# Patient Record
Sex: Male | Born: 1955
Health system: Southern US, Community
[De-identification: ages and names within clinical notes are randomized; demographics above are authoritative.]

## PROBLEM LIST (undated history)

## (undated) DIAGNOSIS — M75100 Unspecified rotator cuff tear or rupture of unspecified shoulder, not specified as traumatic: Secondary | ICD-10-CM

## (undated) DIAGNOSIS — K219 Gastro-esophageal reflux disease without esophagitis: Secondary | ICD-10-CM

## (undated) DIAGNOSIS — I2510 Atherosclerotic heart disease of native coronary artery without angina pectoris: Secondary | ICD-10-CM

## (undated) DIAGNOSIS — Z8781 Personal history of (healed) traumatic fracture: Secondary | ICD-10-CM

## (undated) DIAGNOSIS — G4733 Obstructive sleep apnea (adult) (pediatric): Secondary | ICD-10-CM

## (undated) DIAGNOSIS — I493 Ventricular premature depolarization: Secondary | ICD-10-CM

## (undated) DIAGNOSIS — R198 Other specified symptoms and signs involving the digestive system and abdomen: Secondary | ICD-10-CM

## (undated) DIAGNOSIS — I5189 Other ill-defined heart diseases: Secondary | ICD-10-CM

## (undated) DIAGNOSIS — I209 Angina pectoris, unspecified: Secondary | ICD-10-CM

## (undated) DIAGNOSIS — I119 Hypertensive heart disease without heart failure: Secondary | ICD-10-CM

## (undated) DIAGNOSIS — F1020 Alcohol dependence, uncomplicated: Secondary | ICD-10-CM

## (undated) DIAGNOSIS — I10 Essential (primary) hypertension: Secondary | ICD-10-CM

## (undated) DIAGNOSIS — E785 Hyperlipidemia, unspecified: Secondary | ICD-10-CM

## (undated) DIAGNOSIS — Z87442 Personal history of urinary calculi: Secondary | ICD-10-CM

## (undated) HISTORY — DX: Obstructive sleep apnea (adult) (pediatric): G47.33

## (undated) HISTORY — DX: Other specified symptoms and signs involving the digestive system and abdomen: R19.8

## (undated) HISTORY — DX: Hyperlipidemia, unspecified: E78.5

## (undated) HISTORY — PX: TONSILLECTOMY: SUR1361

## (undated) HISTORY — DX: Essential (primary) hypertension: I10

## (undated) HISTORY — DX: Ventricular premature depolarization: I49.3

## (undated) HISTORY — DX: Gastro-esophageal reflux disease without esophagitis: K21.9

## (undated) HISTORY — DX: Atherosclerotic heart disease of native coronary artery without angina pectoris: I25.10

## (undated) HISTORY — PX: CARDIAC CATHETERIZATION: SHX172

## (undated) HISTORY — DX: Hypertensive heart disease without heart failure: I11.9

## (undated) HISTORY — PX: BACK SURGERY: SHX140

---

## 2003-02-19 HISTORY — PX: CORONARY ARTERY BYPASS GRAFT: SHX141

## 2003-03-07 ENCOUNTER — Inpatient Hospital Stay (HOSPITAL_COMMUNITY): Admission: AD | Admit: 2003-03-07 | Discharge: 2003-03-13 | Payer: Self-pay | Admitting: Cardiology

## 2003-03-11 ENCOUNTER — Encounter: Payer: Self-pay | Admitting: Thoracic Surgery (Cardiothoracic Vascular Surgery)

## 2003-03-17 ENCOUNTER — Inpatient Hospital Stay (HOSPITAL_COMMUNITY)
Admission: RE | Admit: 2003-03-17 | Discharge: 2003-03-21 | Payer: Self-pay | Admitting: Thoracic Surgery (Cardiothoracic Vascular Surgery)

## 2003-03-17 ENCOUNTER — Encounter: Payer: Self-pay | Admitting: Thoracic Surgery (Cardiothoracic Vascular Surgery)

## 2003-03-18 ENCOUNTER — Encounter: Payer: Self-pay | Admitting: Thoracic Surgery (Cardiothoracic Vascular Surgery)

## 2003-03-19 ENCOUNTER — Encounter: Payer: Self-pay | Admitting: Thoracic Surgery (Cardiothoracic Vascular Surgery)

## 2005-02-15 ENCOUNTER — Observation Stay (HOSPITAL_COMMUNITY): Admission: EM | Admit: 2005-02-15 | Discharge: 2005-02-16 | Payer: Self-pay | Admitting: Emergency Medicine

## 2005-04-19 ENCOUNTER — Ambulatory Visit: Payer: Self-pay | Admitting: Internal Medicine

## 2005-05-08 ENCOUNTER — Ambulatory Visit: Payer: Self-pay | Admitting: Anesthesiology

## 2006-07-18 ENCOUNTER — Inpatient Hospital Stay: Payer: Self-pay | Admitting: Unknown Physician Specialty

## 2006-07-18 ENCOUNTER — Other Ambulatory Visit: Payer: Self-pay

## 2006-08-15 ENCOUNTER — Ambulatory Visit: Payer: Self-pay | Admitting: Anesthesiology

## 2006-11-02 ENCOUNTER — Ambulatory Visit: Payer: Self-pay | Admitting: Unknown Physician Specialty

## 2006-12-23 ENCOUNTER — Other Ambulatory Visit: Payer: Self-pay

## 2006-12-23 ENCOUNTER — Inpatient Hospital Stay: Payer: Self-pay | Admitting: Internal Medicine

## 2006-12-27 ENCOUNTER — Ambulatory Visit: Payer: Self-pay | Admitting: Unknown Physician Specialty

## 2007-01-19 ENCOUNTER — Ambulatory Visit: Payer: Self-pay | Admitting: Unknown Physician Specialty

## 2007-02-19 ENCOUNTER — Ambulatory Visit: Payer: Self-pay | Admitting: Unknown Physician Specialty

## 2007-11-21 HISTORY — PX: OTHER SURGICAL HISTORY: SHX169

## 2010-05-23 ENCOUNTER — Inpatient Hospital Stay: Payer: Self-pay | Admitting: Surgery

## 2011-01-16 ENCOUNTER — Ambulatory Visit: Payer: Self-pay | Admitting: Cardiology

## 2011-02-16 ENCOUNTER — Ambulatory Visit (INDEPENDENT_AMBULATORY_CARE_PROVIDER_SITE_OTHER): Payer: BC Managed Care – PPO | Admitting: Cardiology

## 2011-02-16 ENCOUNTER — Encounter: Payer: Self-pay | Admitting: Cardiology

## 2011-02-16 DIAGNOSIS — I259 Chronic ischemic heart disease, unspecified: Secondary | ICD-10-CM

## 2011-02-16 DIAGNOSIS — I10 Essential (primary) hypertension: Secondary | ICD-10-CM

## 2011-02-16 DIAGNOSIS — K219 Gastro-esophageal reflux disease without esophagitis: Secondary | ICD-10-CM

## 2011-02-16 DIAGNOSIS — R0789 Other chest pain: Secondary | ICD-10-CM

## 2011-02-16 DIAGNOSIS — E7800 Pure hypercholesterolemia, unspecified: Secondary | ICD-10-CM

## 2011-02-16 DIAGNOSIS — Z951 Presence of aortocoronary bypass graft: Secondary | ICD-10-CM

## 2011-02-16 NOTE — Assessment & Plan Note (Signed)
The patient has not been experiencing any recent angina pectoris he does have occasional chest pain which is relieved by taking clonazepam and sometimes by lying down and resting.  His last nuclear stress test was 10/12/08 and showed old anterolateral scar but no reversible ischemia.
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
## 2011-02-16 NOTE — Assessment & Plan Note (Signed)
The patient's symptoms of reflux have been controlled with Nexium 40 mg daily.

## 2011-02-16 NOTE — Assessment & Plan Note (Signed)
The patient had a recent complete lab work dated 02/01/11 which we reviewed.  His HDL was 77 and his LDL was 65 and his triglycerides were 65.  These are excellent results.

## 2011-08-06 ENCOUNTER — Emergency Department: Payer: Self-pay | Admitting: Emergency Medicine

## 2011-09-29 ENCOUNTER — Encounter: Payer: Self-pay | Admitting: Cardiology

## 2011-09-29 ENCOUNTER — Ambulatory Visit (INDEPENDENT_AMBULATORY_CARE_PROVIDER_SITE_OTHER): Payer: BC Managed Care – PPO | Admitting: Cardiology

## 2011-09-29 VITALS — BP 115/78 | HR 85 | Ht 68.0 in | Wt 193.4 lb

## 2011-09-29 DIAGNOSIS — I259 Chronic ischemic heart disease, unspecified: Secondary | ICD-10-CM

## 2011-09-29 DIAGNOSIS — I10 Essential (primary) hypertension: Secondary | ICD-10-CM

## 2011-09-29 DIAGNOSIS — E7800 Pure hypercholesterolemia, unspecified: Secondary | ICD-10-CM

## 2011-09-29 NOTE — Assessment & Plan Note (Signed)
The patient has not had any recent chest pain or angina. 

## 2011-09-29 NOTE — Patient Instructions (Addendum)
continue same dose of medications  Your physician wants you to follow-up in: 6 months You will receive a reminder letter in the mail two months in advance. If you don't receive a letter, please call our office to schedule the follow-up appointment.  

## 2011-09-29 NOTE — Assessment & Plan Note (Signed)
The patient is taking Lipitor and Zetia and is not having any side effects from the medication.

## 2011-09-29 NOTE — Progress Notes (Signed)
Chase Scott Date of Birth:  Jul 09, 1956 [facility] / [facility] [address]   [address] [place], [place]  [address] [phone]           Fax   [phone]  History of Present Illness: This pleasant 55 year old Caucasian male is seen for a scheduled 6 month followup office visit to the history of known ischemic heart disease.  He had coronary artery bypass graft surgery on 03/17/03.  His last stent procedure was done in December 2005 at [facility] and was a stent to the obtuse marginal.  Last stress test was a Cardiolite study in November 2009 showing no reversible ischemia and his ejection fraction was 73% and he had an old anterolateral scar.  Patient has not been experiencing any recent chest pain.  He has had some occasional dizziness and he recently cut back on his beta blocker from 150 mg Lopressor to 1.00 mg Lopressor with slight improvement.  Current Outpatient Prescriptions  Medication Sig Dispense Refill  . atorvastatin (LIPITOR) 80 MG tablet Take 80 mg by mouth daily.        . clopidogrel (PLAVIX) 75 MG tablet Take 75 mg by mouth daily.        Marland Kitchen esomeprazole (NEXIUM) 40 MG capsule Take 40 mg by mouth daily before breakfast.        . ezetimibe (ZETIA) 10 MG tablet Take 10 mg by mouth daily.        Marland Kitchen gabapentin (NEURONTIN) 300 MG capsule Take 300 mg by mouth 2 (two) times daily.        . IBUPROFEN PO Take by mouth.        . methocarbamol (ROBAXIN) 750 MG tablet Take 750 mg by mouth daily.        . metoprolol (LOPRESSOR) 100 MG tablet Take 100 mg by mouth. Taking daily      . ramipril (ALTACE) 10 MG tablet Take 10 mg by mouth daily.          Allergies  Allergen Reactions  . Penicillins   . Sulfa Antibiotics     Patient Active Problem List  Diagnoses  . Ischemic heart disease  . Hypertension  . Hypercholesterolemia  . Hx of CABG  . GERD (gastroesophageal reflux disease)    History  Smoking status  . Former Smoker  Smokeless tobacco  . Not on file     History  Alcohol Use: Not on file    No family history on file.  Review of Systems: Constitutional: no fever chills diaphoresis or fatigue or change in weight.  Head and neck: no hearing loss, no epistaxis, no photophobia or visual disturbance. Respiratory: No cough, shortness of breath or wheezing. Cardiovascular: No chest pain peripheral edema, palpitations. Gastrointestinal: No abdominal distention, no abdominal pain, no change in bowel habits hematochezia or melena. Genitourinary: No dysuria, no frequency, no urgency, no nocturia. Musculoskeletal:No arthralgias, no back pain, no gait disturbance or myalgias. Neurological: No dizziness, no headaches, no numbness, no seizures, no syncope, no weakness, no tremors. Hematologic: No lymphadenopathy, no easy bruising. Psychiatric: No confusion, no hallucinations, no sleep disturbance.    Physical Exam: Filed Vitals:   09/29/11 1549  BP: 115/78  Pulse: 85    The patient appears to be in no distress.  Head and neck exam reveals that the pupils are equal and reactive.  The extraocular movements are full.  There is no scleral icterus.  Mouth and pharynx are benign.  No lymphadenopathy.  No carotid bruits.  The jugular venous pressure is normal.  Thyroid is not enlarged or tender.  Chest is clear to percussion and auscultation.  No rales or rhonchi.  Expansion of the chest is symmetrical.  Heart reveals no abnormal lift or heave.  First and second heart sounds are normal.  There is no murmur gallop rub or click.  The abdomen is soft and nontender.  Bowel sounds are normoactive.  There is no hepatosplenomegaly or mass.  There are no abdominal bruits.  Extremities reveal no phlebitis or edema.  Pedal pulses are good.  There is no cyanosis or clubbing.  Neurologic exam is normal strength and no lateralizing weakness.  No sensory deficits.  Integument reveals no rash  Assessment / Plan: Continue present medication and be  rechecked in 6 months for a followup office visit

## 2011-09-29 NOTE — Assessment & Plan Note (Signed)
He feels that his blood pressure is little low at times.  He feels dizzy when this happens.  If this happens he will increase his salt intake rather than cutting back further on his beta blocker since his heart rate today is 83

## 2012-02-01 ENCOUNTER — Encounter: Payer: Self-pay | Admitting: Cardiology

## 2012-07-01 ENCOUNTER — Encounter (HOSPITAL_COMMUNITY): Payer: Self-pay | Admitting: Emergency Medicine

## 2012-07-01 ENCOUNTER — Emergency Department (HOSPITAL_COMMUNITY): Payer: BC Managed Care – PPO

## 2012-07-01 ENCOUNTER — Inpatient Hospital Stay (HOSPITAL_COMMUNITY)
Admission: EM | Admit: 2012-07-01 | Discharge: 2012-07-02 | DRG: 125 | Disposition: A | Discharge status: Home / Self Care | Payer: BC Managed Care – PPO | Attending: Cardiology | Admitting: Cardiology

## 2012-07-01 ENCOUNTER — Telehealth: Payer: Self-pay | Admitting: Cardiology

## 2012-07-01 DIAGNOSIS — Z951 Presence of aortocoronary bypass graft: Secondary | ICD-10-CM

## 2012-07-01 DIAGNOSIS — I259 Chronic ischemic heart disease, unspecified: Secondary | ICD-10-CM

## 2012-07-01 DIAGNOSIS — I2582 Chronic total occlusion of coronary artery: Secondary | ICD-10-CM | POA: Diagnosis present

## 2012-07-01 DIAGNOSIS — E785 Hyperlipidemia, unspecified: Secondary | ICD-10-CM | POA: Diagnosis present

## 2012-07-01 DIAGNOSIS — I2510 Atherosclerotic heart disease of native coronary artery without angina pectoris: Principal | ICD-10-CM

## 2012-07-01 DIAGNOSIS — Z882 Allergy status to sulfonamides status: Secondary | ICD-10-CM

## 2012-07-01 DIAGNOSIS — Z9861 Coronary angioplasty status: Secondary | ICD-10-CM

## 2012-07-01 DIAGNOSIS — Z87891 Personal history of nicotine dependence: Secondary | ICD-10-CM

## 2012-07-01 DIAGNOSIS — Z880 Allergy status to penicillin: Secondary | ICD-10-CM

## 2012-07-01 DIAGNOSIS — Z79899 Other long term (current) drug therapy: Secondary | ICD-10-CM

## 2012-07-01 DIAGNOSIS — R079 Chest pain, unspecified: Secondary | ICD-10-CM

## 2012-07-01 DIAGNOSIS — E7800 Pure hypercholesterolemia, unspecified: Secondary | ICD-10-CM | POA: Diagnosis present

## 2012-07-01 DIAGNOSIS — I10 Essential (primary) hypertension: Secondary | ICD-10-CM | POA: Diagnosis present

## 2012-07-01 DIAGNOSIS — I119 Hypertensive heart disease without heart failure: Secondary | ICD-10-CM

## 2012-07-01 DIAGNOSIS — K219 Gastro-esophageal reflux disease without esophagitis: Secondary | ICD-10-CM | POA: Diagnosis present

## 2012-07-01 DIAGNOSIS — Z7902 Long term (current) use of antithrombotics/antiplatelets: Secondary | ICD-10-CM

## 2012-07-01 LAB — CARDIAC PANEL(CRET KIN+CKTOT+MB+TROPI)
CK, MB: 3.2 ng/mL (ref 0.3–4.0)
Total CK: 229 U/L (ref 7–232)
Troponin I: <0.3 ng/mL (ref <0.30)

## 2012-07-01 LAB — COMPREHENSIVE METABOLIC PANEL
ALT: 20 U/L (ref 0–53)
BUN: 11 mg/dL (ref 6–23)
Calcium: 9.7 mg/dL (ref 8.4–10.5)
GFR calc Af Amer: >90 mL/min (ref >90)
Glucose, Bld: 141 mg/dL — ABNORMAL HIGH (ref 70–99)
Sodium: 141 mEq/L (ref 135–145)
Total Protein: 7.3 g/dL (ref 6.0–8.3)

## 2012-07-01 LAB — PROTIME-INR
INR: 1.04 (ref 0.00–1.49)
INR: 1.11 (ref 0.00–1.49)
Prothrombin Time: 14.5 seconds (ref 11.6–15.2)

## 2012-07-01 LAB — CBC WITH DIFFERENTIAL/PLATELET
Basophils Relative: 1 % (ref 0–1)
Eosinophils Absolute: 0.2 10*3/uL (ref 0.0–0.7)
Hemoglobin: 15.6 g/dL (ref 13.0–17.0)
MCH: 31.1 pg (ref 26.0–34.0)
MCHC: 35.5 g/dL (ref 30.0–36.0)
Monocytes Relative: 7 % (ref 3–12)
Neutrophils Relative %: 50 % (ref 43–77)

## 2012-07-01 LAB — BASIC METABOLIC PANEL
BUN: 11 mg/dL (ref 6–23)
Chloride: 103 mEq/L (ref 96–112)
GFR calc non Af Amer: 73 mL/min — ABNORMAL LOW (ref >90)
Glucose, Bld: 97 mg/dL (ref 70–99)
Potassium: 4.6 mEq/L (ref 3.5–5.1)
Sodium: 139 mEq/L (ref 135–145)

## 2012-07-01 LAB — POCT I-STAT TROPONIN I: Troponin i, poc: 0 ng/mL (ref 0.00–0.08)

## 2012-07-01 LAB — MAGNESIUM: Magnesium: 2.1 mg/dL (ref 1.5–2.5)

## 2012-07-01 MED ORDER — NITROGLYCERIN 0.4 MG SL SUBL
0.4000 mg | SUBLINGUAL_TABLET | SUBLINGUAL | Status: DC | PRN
  Administered 2012-07-01 (×2): 0.4 mg via SUBLINGUAL

## 2012-07-01 MED ORDER — ACETAMINOPHEN 325 MG PO TABS: 650.0000 mg | ORAL_TABLET | ORAL | Status: DC | PRN

## 2012-07-01 MED ORDER — SODIUM CHLORIDE 0.9 % IJ SOLN: 3.0000 mL | INTRAMUSCULAR | Status: DC | PRN

## 2012-07-01 MED ORDER — HEPARIN BOLUS VIA INFUSION
4000.0000 [IU] | Freq: Once | INTRAVENOUS | Status: AC
  Administered 2012-07-01: 4000 [IU] via INTRAVENOUS

## 2012-07-01 MED ORDER — SODIUM CHLORIDE 0.9 % IJ SOLN: 3.0000 mL | Freq: Two times a day (BID) | INTRAMUSCULAR | Status: DC

## 2012-07-01 MED ORDER — NITROGLYCERIN 0.4 MG SL SUBL: 0.4000 mg | SUBLINGUAL_TABLET | SUBLINGUAL | Status: DC | PRN

## 2012-07-01 MED ORDER — ASPIRIN 81 MG PO CHEW
324.0000 mg | CHEWABLE_TABLET | ORAL | Status: DC
  Filled 2012-07-01: qty 4

## 2012-07-01 MED ORDER — ASPIRIN EC 81 MG PO TBEC: 81.0000 mg | DELAYED_RELEASE_TABLET | Freq: Every day | ORAL | Status: DC

## 2012-07-01 MED ORDER — SODIUM CHLORIDE 0.9 % IV SOLN: 250.0000 mL | INTRAVENOUS | Status: DC | PRN

## 2012-07-01 MED ORDER — METHOCARBAMOL 750 MG PO TABS
375.0000 mg | ORAL_TABLET | Freq: Every day | ORAL | Status: DC | PRN
  Filled 2012-07-01: qty 1

## 2012-07-01 MED ORDER — ONDANSETRON HCL 4 MG/2ML IJ SOLN: 4.0000 mg | Freq: Four times a day (QID) | INTRAMUSCULAR | Status: DC | PRN

## 2012-07-01 MED ORDER — RAMIPRIL 10 MG PO TABS: 10.0000 mg | ORAL_TABLET | Freq: Two times a day (BID) | ORAL | Status: DC

## 2012-07-01 MED ORDER — PANTOPRAZOLE SODIUM 40 MG PO TBEC
80.0000 mg | DELAYED_RELEASE_TABLET | Freq: Two times a day (BID) | ORAL | Status: DC
  Administered 2012-07-01 – 2012-07-02 (×2): 80 mg via ORAL
  Filled 2012-07-01: qty 1

## 2012-07-01 MED ORDER — METOPROLOL TARTRATE 50 MG PO TABS
50.0000 mg | ORAL_TABLET | Freq: Three times a day (TID) | ORAL | Status: DC
  Administered 2012-07-01: 50 mg via ORAL
  Filled 2012-07-01 (×4): qty 1

## 2012-07-01 MED ORDER — ATORVASTATIN CALCIUM 80 MG PO TABS
80.0000 mg | ORAL_TABLET | Freq: Every day | ORAL | Status: DC
  Administered 2012-07-01 – 2012-07-02 (×2): 80 mg via ORAL
  Filled 2012-07-01 (×2): qty 1

## 2012-07-01 MED ORDER — GABAPENTIN 300 MG PO CAPS
300.0000 mg | ORAL_CAPSULE | Freq: Two times a day (BID) | ORAL | Status: DC | PRN
  Filled 2012-07-01: qty 1

## 2012-07-01 MED ORDER — RAMIPRIL 10 MG PO CAPS
10.0000 mg | ORAL_CAPSULE | Freq: Two times a day (BID) | ORAL | Status: DC
  Administered 2012-07-01: 10 mg via ORAL
  Filled 2012-07-01 (×4): qty 1

## 2012-07-01 MED ORDER — ZOLPIDEM TARTRATE 5 MG PO TABS: 5.0000 mg | ORAL_TABLET | Freq: Every evening | ORAL | Status: DC | PRN

## 2012-07-01 MED ORDER — EZETIMIBE 10 MG PO TABS
10.0000 mg | ORAL_TABLET | Freq: Every day | ORAL | Status: DC
  Administered 2012-07-02: 10 mg via ORAL
  Filled 2012-07-01: qty 1

## 2012-07-01 MED ORDER — ASPIRIN 81 MG PO CHEW
324.0000 mg | CHEWABLE_TABLET | ORAL | Status: AC
  Administered 2012-07-02: 324 mg via ORAL

## 2012-07-01 MED ORDER — HEPARIN (PORCINE) IN NACL 100-0.45 UNIT/ML-% IJ SOLN
1300.0000 [IU]/h | INTRAMUSCULAR | Status: DC
  Administered 2012-07-01: 1200 [IU]/h via INTRAVENOUS
  Filled 2012-07-01 (×2): qty 250

## 2012-07-01 MED ORDER — ASPIRIN 300 MG RE SUPP
300.0000 mg | RECTAL | Status: DC
  Filled 2012-07-01: qty 1

## 2012-07-01 MED ORDER — ASPIRIN 81 MG PO CHEW
324.0000 mg | CHEWABLE_TABLET | Freq: Once | ORAL | Status: AC
  Administered 2012-07-01: 324 mg via ORAL
  Filled 2012-07-01: qty 4

## 2012-07-01 MED ORDER — ALPRAZOLAM 0.25 MG PO TABS: 0.2500 mg | ORAL_TABLET | Freq: Two times a day (BID) | ORAL | Status: DC | PRN

## 2012-07-01 NOTE — Telephone Encounter (Signed)
Please return call to patient at work [phone]  Pt c/o chest pains, SOB, light headed, Tingling in fingers

## 2012-07-01 NOTE — Telephone Encounter (Signed)
Patient has been having chest pain for sometime , but for the last two days the chest pain and pressure has been unbearable. Pain gets worse with mild activity score " 7 or 8 ", it gest better when he sits down, but the pressure is still there also he gets light headed with activity. Patient is at work took a NTG which it help the pain.

## 2012-07-01 NOTE — Progress Notes (Signed)
ANTICOAGULATION CONSULT NOTE - Initial Consult  Pharmacy Consult for  Heparin Indication: chest pain/ACS  Allergies  Allergen Reactions  . Penicillins Anaphylaxis  . Sulfa Antibiotics Anaphylaxis    Patient Measurements: Height: 5\' 8"  (172.7 cm) Weight: 200 lb (90.719 kg) (estimated by patient) IBW/kg (Calculated) : 68.4  Heparin Dosing Weight: 86 kg  Vital Signs: Temp: 98.1 F (36.7 C) (08/12 1336) Temp src: Oral (08/12 1336) BP: 119/75 mmHg (08/12 1630) Pulse Rate: 65  (08/12 1630)  Labs:  Basename 07/01/12 1410  HGB 15.2  HCT 41.8  PLT 198  APTT --  LABPROT 13.8  INR 1.04  HEPARINUNFRC --  CREATININE 1.10  CKTOTAL --  CKMB --  TROPONINI --    Estimated Creatinine Clearance: 82 ml/min (by C-G formula based on Cr of 1.1).   Medical History: Past Medical History  Diagnosis Date  . CAD (coronary artery disease)   . Fracture     Left shoulder  . Chest pain     Rule out myocardial infarction. Rule out acute coronary syndrome.  . Labile hypertension   . Hyperlipidemia   . Gastrointestinal symptoms     Chronic gastrointestinal symptoms  . Gastroesophageal reflux disease   . Dyslipidemia   . Hypertensive cardiovascular disease    Assessment:  56 yr old man being admitted with chest pain.  Hx CAD, CABG in 2004, stent in 2005.  Taking Plavix at home but not taking Aspirin -> Aspirin 324 mg given in ED, and EC Aspirin 81 mg daily added.  Planning cardiac cath 07/02/12.  Goal of Therapy:  Heparin level 0.3-0.7 units/ml Monitor platelets by anticoagulation protocol: Yes   Plan:    Heparin 4000 units IV x1 followed by 1200 units/hr infusion.   Heparin level ~6 hrs after drip begins.   Daily heparin level and CBC while on Heparin.  Dennie Fetters, [place] Pager: [phone] 07/01/2012,6:46 PM

## 2012-07-01 NOTE — H&P (Signed)
Chase Scott is an 56 y.o. male.   Chief Complaint: Chest pain HPI: This 56 year old man came to the ER today because of worsening chest pain. He has a history of known CAD. He had CABG x4 on 03/17/03 by Dr. Cornelius Moras.  In 2005 he had cath at [facility] with stent to obtuse marginal.. Last stress test was Cardiolite in November 2009 showed no reversible ischemia and his EF was 73% and there was old anterolateral scar. Last seen in office 09/29/11.   Now for past several weeks he has had worsening chest pressure at rest and with activity notes a burning substernal discomfort. Today he also noted numbness of his hands and became concerned.  Trial of SL NTG at home has not resulted in convincing change.  Patient has also had lightheaded feelings and has been dyspneic.  Past Medical History  Diagnosis Date  . CAD (coronary artery disease)   . Fracture     Left shoulder  . Chest pain     Rule out myocardial infarction. Rule out acute coronary syndrome.  . Labile hypertension   . Hyperlipidemia   . Gastrointestinal symptoms     Chronic gastrointestinal symptoms  . Gastroesophageal reflux disease   . Dyslipidemia   . Hypertensive cardiovascular disease     Past Surgical History  Procedure Date  . Coronary artery bypass graft April 2004    x four  . Back surgery     Lower    History reviewed. No pertinent family history. Social History:  reports that he has quit smoking. He does not have any smokeless tobacco history on file. His alcohol and drug histories not on file.  Allergies:  Allergies  Allergen Reactions  . Penicillins Anaphylaxis  . Sulfa Antibiotics Anaphylaxis       Results for orders placed during the hospital encounter of 07/01/12 (from the past 48 hour(s))  CBC     Status: Abnormal   Collection Time   07/01/12  2:10 PM      Component Value Range Comment   WBC 6.7  4.0 - 10.5 K/uL    RBC 4.84  4.22 - 5.81 MIL/uL    Hemoglobin 15.2  13.0 - 17.0 g/dL    HCT 40.9   81.1 - 91.4 %    MCV 86.4  78.0 - 100.0 fL    MCH 31.4  26.0 - 34.0 pg    MCHC 36.4 (*) 30.0 - 36.0 g/dL    RDW 78.2  95.6 - 21.3 %    Platelets 198  150 - 400 K/uL   BASIC METABOLIC PANEL     Status: Abnormal   Collection Time   07/01/12  2:10 PM      Component Value Range Comment   Sodium 139  135 - 145 mEq/L    Potassium 4.6  3.5 - 5.1 mEq/L    Chloride 103  96 - 112 mEq/L    CO2 28  19 - 32 mEq/L    Glucose, Bld 97  70 - 99 mg/dL    BUN 11  6 - 23 mg/dL    Creatinine, Ser 0.86  0.50 - 1.35 mg/dL    Calcium 9.7  8.4 - 57.8 mg/dL    GFR calc non Af Amer 73 (*) >90 mL/min    GFR calc Af Amer 85 (*) >90 mL/min   PROTIME-INR     Status: Normal   Collection Time   07/01/12  2:10 PM  Component Value Range Comment   Prothrombin Time 13.8  11.6 - 15.2 seconds    INR 1.04  0.00 - 1.49   POCT I-STAT TROPONIN I     Status: Normal   Collection Time   07/01/12  2:30 PM      Component Value Range Comment   Troponin i, poc 0.00  0.00 - 0.08 ng/mL    Comment 3             Dg Chest 2 View  07/01/2012  *RADIOLOGY REPORT*  Clinical Data: Chest pain  CHEST - 2 VIEW  Comparison: 02/15/2005  Findings: Normal heart size.  Clear lungs.  Pneumothorax.  No pleural effusion.  Vascular calcifications in the right side of the neck.  IMPRESSION: No active cardiopulmonary disease.  Original Report Authenticated By: Donavan Burnet, M.D.    ROS: Chronic dyspepsia.  Patient also has history of anxiety.  Recently his PCP stopped clonazepam and switched him to gabapentin and Robaxin.  Blood pressure 119/75, pulse 65, temperature 98.1 F (36.7 C), temperature source Oral, resp. rate 11, SpO2 98.00%. The patient appears to be in moderate distress.   Head and neck exam reveals that the pupils are equal and reactive.  The extraocular movements are full.  There is no scleral icterus.  Mouth and pharynx are benign.  No lymphadenopathy.  No carotid bruits.  The jugular venous pressure is normal.  Thyroid is not  enlarged or tender.  Chest is clear to percussion and auscultation.  No rales or rhonchi.  Expansion of the chest is symmetrical.  Heart reveals no abnormal lift or heave.  First and second heart sounds are normal.  There is no murmur gallop rub or click.  The abdomen is soft and nontender.  Bowel sounds are normoactive.  There is no hepatosplenomegaly or mass.  There are no abdominal bruits.  Extremities reveal no phlebitis or edema.  Pedal pulses are good.  There is no cyanosis or clubbing.  Neurologic exam is normal strength and no lateralizing weakness.  No sensory deficits.  Integument reveals no rash  EKG shows no ischemic changes. Initial troponin is normal.  Renal function normal  Assessment/Plan 1.  Worsening chest pain, possible ACS.  EKG normal so far. Troponin normal. 2.  History of CABG x4 in 2004 by Dr. Cornelius Moras, and history of stent to obtuse marginal 2005 at [facility]. 3.  Hx of dyslipidemia 4.  Hx of GERD 5.  Hx of hypertensive CV disease.  Plan:  Admit to monitored bed. Serial enzymes. Cath in am.  Cassell Clement 07/01/2012, 5:18 PM

## 2012-07-01 NOTE — ED Provider Notes (Signed)
History     CSN: [id number]  Arrival date & time 07/01/12  1329   First MD Initiated Contact with Patient 07/01/12 1501      Chief Complaint  Patient presents with  . Chest Pain    (Consider location/radiation/quality/duration/timing/severity/associated sxs/prior treatment) Patient is a 56 y.o. male presenting with chest pain. The history is provided by the patient.  Chest Pain The chest pain began 2 days ago. Chest pain occurs frequently. The chest pain is unchanged. The pain is associated with exertion. At its most intense, the pain is at 8/10. The pain is currently at 3/10. The quality of the pain is described as pressure-like. The pain does not radiate. Chest pain is worsened by exertion. Primary symptoms include nausea. Pertinent negatives for primary symptoms include no syncope, no shortness of breath, no cough, no palpitations, no vomiting and no dizziness.  Pertinent negatives for associated symptoms include no diaphoresis, no lower extremity edema, no near-syncope, no orthopnea, no paroxysmal nocturnal dyspnea and no weakness. He tried nitroglycerin (SL Nitro x1) for the symptoms.  His past medical history is significant for CAD, hypertension and MI.     Past Medical History  Diagnosis Date  . CAD (coronary artery disease)   . Fracture     Left shoulder  . Chest pain     Rule out myocardial infarction. Rule out acute coronary syndrome.  . Labile hypertension   . Hyperlipidemia   . Gastrointestinal symptoms     Chronic gastrointestinal symptoms  . Gastroesophageal reflux disease   . Dyslipidemia   . Hypertensive cardiovascular disease     Past Surgical History  Procedure Date  . Coronary artery bypass graft April 2004    x four  . Back surgery     Lower    History reviewed. No pertinent family history.  History  Substance Use Topics  . Smoking status: Former Games developer  . Smokeless tobacco: Not on file  . Alcohol Use: Not on file      Review of Systems    Constitutional: Negative for diaphoresis.  HENT: Negative.   Respiratory: Negative for cough and shortness of breath.   Cardiovascular: Positive for chest pain. Negative for palpitations, orthopnea, leg swelling, syncope and near-syncope.  Gastrointestinal: Positive for nausea. Negative for vomiting, diarrhea and constipation.  Genitourinary: Negative.   Musculoskeletal: Negative.   Skin: Negative.   Neurological: Negative.  Negative for dizziness, syncope, weakness and light-headedness.  All other systems reviewed and are negative.    Allergies  Penicillins and Sulfa antibiotics  Home Medications   No current outpatient prescriptions on file.  BP 144/93  Pulse 64  Temp 97.5 F (36.4 C) (Oral)  Resp 20  Ht 5\' 8"  (1.727 m)  Wt 207 lb 10.8 oz (94.2 kg)  BMI 31.58 kg/m2  SpO2 97%  Physical Exam  Nursing note and vitals reviewed. Constitutional: He is oriented to person, place, and time. He appears well-developed and well-nourished. No distress.  HENT:  Head: Normocephalic and atraumatic.  Eyes: Conjunctivae are normal.  Neck: Neck supple.  Cardiovascular: Normal rate, regular rhythm, normal heart sounds and intact distal pulses.   No murmur heard. Pulmonary/Chest: Effort normal and breath sounds normal. He has no wheezes. He has no rales.  Abdominal: Soft. He exhibits no distension. There is no tenderness.  Musculoskeletal: Normal range of motion.  Neurological: He is alert and oriented to person, place, and time.  Skin: Skin is warm and dry.    ED Course  Procedures (including  critical care time)  Labs Reviewed  CBC - Abnormal; Notable for the following:    MCHC 36.4 (*)     All other components within normal limits  BASIC METABOLIC PANEL - Abnormal; Notable for the following:    GFR calc non Af Amer 73 (*)     GFR calc Af Amer 85 (*)     All other components within normal limits  APTT - Abnormal; Notable for the following:    aPTT 174 (*)     All other  components within normal limits  COMPREHENSIVE METABOLIC PANEL - Abnormal; Notable for the following:    Glucose, Bld 141 (*)     GFR calc non Af Amer 79 (*)     All other components within normal limits  PROTIME-INR  POCT I-STAT TROPONIN I  CARDIAC PANEL(CRET KIN+CKTOT+MB+TROPI)  PROTIME-INR  CBC WITH DIFFERENTIAL  MAGNESIUM  CBC  HEPARIN LEVEL (UNFRACTIONATED)  CARDIAC PANEL(CRET KIN+CKTOT+MB+TROPI)  CARDIAC PANEL(CRET KIN+CKTOT+MB+TROPI)  TSH  LIPID PANEL   Dg Chest 2 View  07/01/2012  *RADIOLOGY REPORT*  Clinical Data: Chest pain  CHEST - 2 VIEW  Comparison: 02/15/2005  Findings: Normal heart size.  Clear lungs.  Pneumothorax.  No pleural effusion.  Vascular calcifications in the right side of the neck.  IMPRESSION: No active cardiopulmonary disease.  Original Report Authenticated By: Donavan Burnet, M.D.     1. Ischemic heart disease       MDM  56 yo male with PMHx of CAD s/p CABG and stenting, HTN who presents for 2-3 days of worsening, exertional chest pain.  Pain improves with rest and nitro.  Pt has had 2-3 episodes per day for the past 2 days.  AF, VSS, NAD at presentation.  Pain currently 3/10.  No LE edema, orthopnea, or PND.  Will give ASA and Nitro.  EKG: Rate 82, NSR, nml axis, nml intervals, no ST changes, similar to previous EKG from 09/29/11.  Initial troponin negative.  Presentation concerning for unstable angina.  Will admit to cardiology for further evaluation of unstable angina.        Cherre Robins, MD 07/01/12 [phone]

## 2012-07-01 NOTE — ED Notes (Signed)
Pt given crackers and gingerale.

## 2012-07-01 NOTE — ED Notes (Signed)
Pt c/o left CP started today; pt sts SOB

## 2012-07-01 NOTE — ED Notes (Signed)
Heart healthy dinner tray ordered 

## 2012-07-01 NOTE — ED Notes (Signed)
MD at bedside. Cardiology consult. 

## 2012-07-01 NOTE — Telephone Encounter (Signed)
Spoke with patient and he stated he was having a lot of chest pressure but not as much pain while resting but if he gets and moves around pain will come back.  Did discuss with  Dr. Patty Sermons and he thinks patient needs to go to emergency department.  Advised patient and he does not want to call 911 since he would go to [facility] and would prefer to go to [facility], stated his wife will drive him.

## 2012-07-02 ENCOUNTER — Encounter (HOSPITAL_COMMUNITY)
Admission: EM | Disposition: A | Discharge status: Home / Self Care | Payer: Self-pay | Source: Home / Self Care | Attending: Cardiology

## 2012-07-02 DIAGNOSIS — I2510 Atherosclerotic heart disease of native coronary artery without angina pectoris: Secondary | ICD-10-CM

## 2012-07-02 DIAGNOSIS — R079 Chest pain, unspecified: Secondary | ICD-10-CM

## 2012-07-02 DIAGNOSIS — I119 Hypertensive heart disease without heart failure: Secondary | ICD-10-CM

## 2012-07-02 HISTORY — PX: LEFT HEART CATHETERIZATION WITH CORONARY/GRAFT ANGIOGRAM: SHX5450

## 2012-07-02 LAB — CARDIAC PANEL(CRET KIN+CKTOT+MB+TROPI)
CK, MB: 3 ng/mL (ref 0.3–4.0)
Relative Index: 1 (ref 0.0–2.5)
Troponin I: <0.3 ng/mL (ref <0.30)

## 2012-07-02 LAB — CBC
MCV: 87.9 fL (ref 78.0–100.0)
Platelets: 181 10*3/uL (ref 150–400)
RBC: 4.54 MIL/uL (ref 4.22–5.81)
RDW: 12.7 % (ref 11.5–15.5)
WBC: 8.1 10*3/uL (ref 4.0–10.5)

## 2012-07-02 LAB — LIPID PANEL
HDL: 49 mg/dL (ref >39)
Triglycerides: 217 mg/dL — ABNORMAL HIGH (ref <150)

## 2012-07-02 LAB — POCT ACTIVATED CLOTTING TIME: Activated Clotting Time: 110 seconds

## 2012-07-02 SURGERY — LEFT HEART CATHETERIZATION WITH CORONARY/GRAFT ANGIOGRAM

## 2012-07-02 MED ORDER — MIDAZOLAM HCL 2 MG/2ML IJ SOLN
INTRAMUSCULAR | Status: AC
  Filled 2012-07-02: qty 2

## 2012-07-02 MED ORDER — ASPIRIN 81 MG PO TBEC: 81.0000 mg | DELAYED_RELEASE_TABLET | Freq: Every day | ORAL | Status: AC

## 2012-07-02 MED ORDER — FENTANYL CITRATE 0.05 MG/ML IJ SOLN
INTRAMUSCULAR | Status: AC
  Filled 2012-07-02: qty 2

## 2012-07-02 MED ORDER — SODIUM CHLORIDE 0.9 % IV SOLN
INTRAVENOUS | Status: DC
  Administered 2012-07-02: 11:00:00 via INTRAVENOUS

## 2012-07-02 MED ORDER — HEPARIN (PORCINE) IN NACL 2-0.9 UNIT/ML-% IJ SOLN
INTRAMUSCULAR | Status: AC
  Filled 2012-07-02: qty 2000

## 2012-07-02 MED ORDER — LIDOCAINE HCL (PF) 1 % IJ SOLN
INTRAMUSCULAR | Status: AC
  Filled 2012-07-02: qty 30

## 2012-07-02 MED ORDER — NITROGLYCERIN 0.2 MG/ML ON CALL CATH LAB
INTRAVENOUS | Status: AC
  Filled 2012-07-02: qty 1

## 2012-07-02 MED ORDER — PANTOPRAZOLE SODIUM 40 MG PO TBEC: 80.0000 mg | DELAYED_RELEASE_TABLET | Freq: Two times a day (BID) | ORAL | Status: DC

## 2012-07-02 NOTE — Discharge Summary (Signed)
See cath note this am. cdm

## 2012-07-02 NOTE — CV Procedure (Signed)
   Cardiac Catheterization Operative Report  Chase Scott [id number] 8/13/20138:25 AM DEFAULT,PROVIDER, MD  Procedure Performed:  1. Left Heart Catheterization 2. Selective Coronary Angiography 3. Left ventricular angiogram 4. SVG angiography 5. LIMA angiography  Operator: Verne Carrow, MD  Indication:  Chest pain, known CAD with 4VCABG in 2004, previous stent Circumflex and RCA. Cardiac enzymes negative.                                  Procedure Details: The risks, benefits, complications, treatment options, and expected outcomes were discussed with the patient. The patient and/or family concurred with the proposed plan, giving informed consent. The patient was brought to the cath lab after IV hydration was begun and oral premedication was given. The patient was further sedated with Versed and Fentanyl. The right groin was prepped and draped in the usual manner. Using the modified Seldinger access technique, a 5 French sheath was placed in the right femoral artery. Standard diagnostic catheters were used to perform selective coronary angiography. A IMA catheter was used to non-selectively image the RIMA and selectively engage the LIMA graft. The JR 4 was used to engage both vein grafts and the native RCA. A 4 [place] No-Torque catheter was used to engage the RCA at the end of the case. A pigtail catheter was used to perform a left ventricular angiogram.  There were no immediate complications. The patient was taken to the recovery area in stable condition.   Hemodynamic Findings: Central aortic pressure: 116/70 Left ventricular pressure: 118/2/12  Angiographic Findings:  Left main: No obstructive disease noted.   Left Anterior Descending Artery: Large caliber vessel that courses to the apex. 100% mid occlusion. The mid and distal LAD fills from the patent LIMA graft. The Diagonal fills from the patent SVG.   Circumflex Artery: Large caliber vessel with small to moderate  sized first OM with mild plaque disease. Patent stent in second OM with no restenosis. The AV groove Circumflex has mild plaque disease.   Right Coronary Artery: Moderate sized vessel with patent mid stent with minimal 10% restenosis.   Graft Anatomy:   SVG to Circumflex occluded  SVG to Diagonal patent  RIMA to RCA occluded  LIMA to mid LAD patent. The proximal body of the LIMA is tortuous but has no flow limiting lesions.   Left Ventricular Angiogram: LVEF=50-55%.   Impression: 1. Triple vessel CAD with patent stents in RCA and Circumflex and patent grafts to the LAD and Diagonal.  2. Preserved LV systolic function  Recommendations: Continue medical management. D/C home later today after bedrest.        Complications:  None. The patient tolerated the procedure well.

## 2012-07-02 NOTE — Discharge Summary (Signed)
Discharge Summary   Patient ID: Chase Scott,  MRN: [id number], DOB/AGE: [birth year] 56 y.o.  Admit date: 07/01/2012 Discharge date: 07/02/2012  Primary Physician: Sheila Oats, MD Primary Cardiologist: Cassell Clement, MD  Discharge Diagnoses Principal Problem:  *Chest pain Active Problems:  Hypertension  Hypercholesterolemia  GERD (gastroesophageal reflux disease)  CAD (coronary artery disease)  Hypertensive cardiovascular disease   Allergies Allergies  Allergen Reactions  . Penicillins Anaphylaxis  . Sulfa Antibiotics Anaphylaxis    Diagnostic Studies/Procedures  PA/LATERAL CHEST X-RAY - 07/01/12   Comparison: 02/15/2005  Findings: Normal heart size. Clear lungs. Pneumothorax. No  pleural effusion. Vascular calcifications in the right side of the  neck.  IMPRESSION:  No active cardiopulmonary disease.  CARDIAC CATHETERIZATION - 07/02/12  Hemodynamic Findings:  Central aortic pressure: 116/70  Left ventricular pressure: 118/2/12  Angiographic Findings:  Left main: No obstructive disease noted.  Left Anterior Descending Artery: Large caliber vessel that courses to the apex. 100% mid occlusion. The mid and distal LAD fills from the patent LIMA graft. The Diagonal fills from the patent SVG.  Circumflex Artery: Large caliber vessel with small to moderate sized first OM with mild plaque disease. Patent stent in second OM with no restenosis. The AV groove Circumflex has mild plaque disease.  Right Coronary Artery: Moderate sized vessel with patent mid stent with minimal 10% restenosis.  Graft Anatomy:  SVG to Circumflex occluded  SVG to Diagonal patent  RIMA to RCA occluded  LIMA to mid LAD patent. The proximal body of the LIMA is tortuous but has no flow limiting lesions.  Left Ventricular Angiogram: LVEF=50-55%.  Impression:  1. Triple vessel CAD with patent stents in RCA and Circumflex and patent grafts to the LAD and Diagonal.  2. Preserved LV systolic  function   History of Present Illness/Hospital Course  Chase Scott 56yo male with PMHx significant for the above problem list who was admitted to [facility] on 07/01/12 for chest pain concerning for unstable angina.   The patient reportedly endorsed a several week history of worsening chest pressure and burning with activity and at rest with associated bilateral numbness of his hands, shortness of breath and dyspnea. The pain worsened, thus prompting his presentation to [facility] ED. There, EKG revealed no evidence of ischemia. Initial trop-I returned WNL. CXR as above revealed no active cardiopulmonary disease. CBC and BMET were unremarkable. Given the patient's history of CAD with prior PCI and CABG, cardiac risk factors and history concerning for unstable angina, he was admitted with plans for diagnostic cardiac catheterization the following morning.   He was heparinized and continued on his outpatient medical regimen which included ASA/Plavix/ACEi/BB/statin. He remained stable overnight without significant chest pain. Two subsequent sets of cardiac biomarkers returned WNL. Lipid panel returned revealing good LDL control (LDL 62, HDL, 49, TG 217, TC 154). TSH returned WNL. He remained NPO the following morning, and was informed, consented and prepped for cardiac catheterization which was accessed via the R groin. The full details are described above. This was notable for 3v CAD with patent RCA/LCx stents and patent grafts to LAD and diagonal. LVEF quantified at 50-55%. He tolerated the procedure well without complications. The recommendation was made to continue medical management. He was assessed by Dr. Clifton James and found to be stable for discharge. He will continue his prior outpatient medications as listed below. Nexium will be replaced with Protonix to minimize interaction with Plavix. He will follow-up with Dr. Patty Sermons in approximately 2 weeks as outlined below. He  ambulated well, without incident,  and groin site was assessed and deemed to be without evidence of complications. This information, including activity restrictions, has been clearly outlined in the discharge AVS.   Discharge Vitals:  Blood pressure 105/66, pulse 65, temperature 98.3 F (36.8 C), temperature source Oral, resp. rate 18, height 5\' 8"  (1.727 m), weight 94.2 kg (207 lb 10.8 oz), SpO2 100.00%.   Labs: Recent Labs  [facility] 07/02/12 0203 07/01/12 2132   WBC 8.1 7.9   HGB 14.1 15.6   HCT 39.9 43.9   MCV 87.9 87.6   PLT 181 203    Lab 07/01/12 2132 07/01/12 1410  NA 141 139  K 3.7 4.6  CL 102 103  CO2 31 28  BUN 11 11  CREATININE 1.03 1.10  CALCIUM 9.7 9.7  PROT 7.3 --  BILITOT 0.7 --  ALKPHOS 66 --  ALT 20 --  AST 21 --  AMYLASE -- --  LIPASE -- --  GLUCOSE 141* 97   Recent Labs  Basename 07/02/12 1000 07/02/12 0203 07/01/12 2103   CKTOTAL 310* 265* 229   CKMB 3.0 3.3 3.2   CKMBINDEX -- -- --   TROPONINI <0.30 <0.30 <0.30   Recent Labs  Basename 07/02/12 0333   CHOL 154   HDL 49   LDLCALC 62   TRIG 217*   CHOLHDL 3.1   LDLDIRECT --    Basename 07/01/12 2132  TSH 3.129  T4TOTAL --  T3FREE --  THYROIDAB --   Disposition:  Discharge Orders    Future Appointments: Provider: Department: Dept Phone: Center:   07/17/2012 8:45 AM Cassell Clement, MD [facility] [phone] None     Follow-up Information    Follow up with Cassell Clement, MD on 07/17/2012. (At 8:45 AM for follow-up after this hospitalization. )    Contact information:   [address], [address] [place] [address] [phone]         Discharge Medications:  Medication List  As of 07/02/2012  1:02 PM   START taking these medications         aspirin 81 MG EC tablet   Take 1 tablet (81 mg total) by mouth daily.      pantoprazole 40 MG tablet   Commonly known as: PROTONIX   Take 2 tablets (80 mg total) by mouth 2 (two) times daily.         CONTINUE taking these medications           atorvastatin 80 MG tablet   Commonly known as: LIPITOR      clopidogrel 75 MG tablet   Commonly known as: PLAVIX      ezetimibe 10 MG tablet   Commonly known as: ZETIA      gabapentin 300 MG capsule   Commonly known as: NEURONTIN      methocarbamol 750 MG tablet   Commonly known as: ROBAXIN      metoprolol 100 MG tablet   Commonly known as: LOPRESSOR      nitroGLYCERIN 0.4 MG SL tablet   Commonly known as: NITROSTAT      ramipril 10 MG tablet   Commonly known as: ALTACE         STOP taking these medications         esomeprazole 40 MG capsule          Where to get your medications    These are the prescriptions that you need to pick up. We sent them to  a specific pharmacy, so you will need to go there to get them.   [facility] [address] [place], [place] - [address]    [address] [place] [address]    Phone: [phone]        pantoprazole 40 MG tablet         Information on where to get these meds is not yet available. Ask your nurse or doctor.         aspirin 81 MG EC tablet           Outstanding Labs/Studies: None  Duration of Discharge Encounter: Greater than 30 minutes including physician time.  Signed, R. Hurman Horn, PA-C 07/02/2012, 1:02 PM

## 2012-07-02 NOTE — Progress Notes (Signed)
Pt ambulated in hallway 500 ft independently and tolerated activity well. Will Continue to monitor. 

## 2012-07-02 NOTE — Progress Notes (Signed)
ANTICOAGULATION CONSULT NOTE - Follow Up  Pharmacy Consult for  Heparin Indication: chest pain/ACS  Allergies  Allergen Reactions  . Penicillins Anaphylaxis  . Sulfa Antibiotics Anaphylaxis    Patient Measurements: Height: 5\' 8"  (172.7 cm) Weight: 207 lb 10.8 oz (94.2 kg) IBW/kg (Calculated) : 68.4  Heparin Dosing Weight: 86 kg  Vital Signs: Temp: 97.5 F (36.4 C) (08/12 2029) Temp src: Oral (08/12 2029) BP: 144/93 mmHg (08/12 2029) Pulse Rate: 64  (08/12 2029)  Labs:  Basename 07/02/12 0203 07/01/12 2132 07/01/12 2103 07/01/12 1410  HGB 14.1 15.6 -- --  HCT 39.9 43.9 -- 41.8  PLT 181 203 -- 198  APTT -- 174* -- --  LABPROT -- 14.5 -- 13.8  INR -- 1.11 -- 1.04  HEPARINUNFRC 0.31 -- -- --  CREATININE -- 1.03 -- 1.10  CKTOTAL -- -- 229 --  CKMB -- -- 3.2 --  TROPONINI -- -- <0.30 --    Estimated Creatinine Clearance: 89.1 ml/min (by C-G formula based on Cr of 1.03).   Medical History: Past Medical History  Diagnosis Date  . CAD (coronary artery disease)   . Fracture     Left shoulder  . Chest pain     Rule out myocardial infarction. Rule out acute coronary syndrome.  . Labile hypertension   . Hyperlipidemia   . Gastrointestinal symptoms     Chronic gastrointestinal symptoms  . Gastroesophageal reflux disease   . Dyslipidemia   . Hypertensive cardiovascular disease    Assessment:  56 yr old man being admitted with chest pain.  Hx CAD, CABG in 2004, stent in 2005.  Taking Plavix at home but not taking Aspirin -> Aspirin 324 mg given in ED, and EC Aspirin 81 mg daily added.  Planning cardiac cath 07/02/12. Heparin level is at low-end of goal range.   Goal of Therapy:  Heparin level 0.3-0.7 units/ml Monitor platelets by anticoagulation protocol: Yes   Plan:  1. Increase IV heparin to 1300 units/hr to keep within goal range. 2. Heparin level in 6 hours vs. follow-up post-cath.  Emeline Gins 07/02/2012,2:38 AM

## 2012-07-02 NOTE — Interval H&P Note (Signed)
History and Physical Interval Note:  07/02/2012 7:34 AM  Chase Scott  has presented today for surgery, with the diagnosis of Chest pain  The various methods of treatment have been discussed with the patient and family. After consideration of risks, benefits and other options for treatment, the patient has consented to  Procedure(s) (LRB): LEFT HEART CATHETERIZATION WITH CORONARY ANGIOGRAM (N/A) as a surgical intervention .  The patient's history has been reviewed, patient examined, no change in status, stable for surgery.  I have reviewed the patient's chart and labs.  Questions were answered to the patient's satisfaction.     MCALHANY,CHRISTOPHER

## 2012-07-02 NOTE — ED Provider Notes (Signed)
I saw and evaluated the patient, reviewed Dr. Daleen Bo note and I agree with the findings and plan.  The patient presents complaining of pressure in the chest for the past several days.  He has a history of cabg in 2004 with stents several years ago.  He says these symptoms are consistent with his prior cardiac pain.  He denies fever, cough, or shortness of breath.  On exam, the vitals are stable and the patient is afebrile.  The heart and lung exam are unremarkable.  The abdomen is non-tender.  There is no ble edema.  Today's workup revealed and unchanged ekg and the troponin is negative.  Due to the patient's pmh and nature of his symptoms, cardiology has been consulted for admission.  Geoffery Lyons, MD 07/02/12 [phone]

## 2012-07-17 ENCOUNTER — Encounter: Payer: Self-pay | Admitting: Cardiology

## 2012-07-17 ENCOUNTER — Ambulatory Visit (INDEPENDENT_AMBULATORY_CARE_PROVIDER_SITE_OTHER): Payer: BC Managed Care – PPO | Admitting: Cardiology

## 2012-07-17 VITALS — BP 118/78 | HR 70 | Ht 68.0 in | Wt 211.0 lb

## 2012-07-17 DIAGNOSIS — E7800 Pure hypercholesterolemia, unspecified: Secondary | ICD-10-CM

## 2012-07-17 DIAGNOSIS — I2510 Atherosclerotic heart disease of native coronary artery without angina pectoris: Secondary | ICD-10-CM

## 2012-07-17 DIAGNOSIS — R079 Chest pain, unspecified: Secondary | ICD-10-CM

## 2012-07-17 NOTE — Patient Instructions (Addendum)
Your physician recommends that you continue on your current medications as directed. Please refer to the Current Medication list given to you today.  Your physician wants you to follow-up in: 6 months with fasting labs (lp/bmet/hfp)  You will receive a reminder letter in the mail two months in advance. If you don't receive a letter, please call our office to schedule the follow-up appointment.  

## 2012-07-17 NOTE — Progress Notes (Signed)
Alycia Patten Date of Birth:  [birth year] [facility] [address] [place], [place]  [address] [phone]  Fax   [phone]  HPI: This pleasant 56 year old Caucasian male is seen for a scheduled  followup office visit.  He has a history of known ischemic heart disease. He had coronary artery bypass graft surgery on 03/17/03. His last stent procedure was done in December 2005 at [facility] and was a stent to the obtuse marginal. Last stress test was a Cardiolite study in November 2009 showing no reversible ischemia and his ejection fraction was 73% and he had an old anterolateral scar. Patient recently was admitted to [facility] for severe chest pain and underwent cardiac catheterization on 07/02/12 which showed that all of his grafts were patent and there was no evidence for any new lesion to account for his chest discomfort.  Since then the patient has continued to have intermittent mild pains but no severe or prolonged pain  Current Outpatient Prescriptions  Medication Sig Dispense Refill  . aspirin EC 81 MG EC tablet Take 1 tablet (81 mg total) by mouth daily.      Marland Kitchen atorvastatin (LIPITOR) 80 MG tablet Take 80 mg by mouth daily.        . clopidogrel (PLAVIX) 75 MG tablet Take 75 mg by mouth daily.        Marland Kitchen ezetimibe (ZETIA) 10 MG tablet Take 10 mg by mouth daily.        Marland Kitchen gabapentin (NEURONTIN) 300 MG capsule Take 300 mg by mouth 2 (two) times daily as needed. pain      . methocarbamol (ROBAXIN) 750 MG tablet Take 325-750 mg by mouth daily as needed. For chest pain      . metoprolol (LOPRESSOR) 100 MG tablet Take 50 mg by mouth 3 (three) times daily. Taking daily      . nitroGLYCERIN (NITROSTAT) 0.4 MG SL tablet Place 0.4 mg under the tongue every 5 (five) minutes as needed. Chest pain      . pantoprazole (PROTONIX) 40 MG tablet Take 40 mg by mouth 2 (two) times daily.      . ramipril (ALTACE) 10 MG tablet Take 10 mg by mouth 2 (two) times daily.       Marland Kitchen  DISCONTD: pantoprazole (PROTONIX) 40 MG tablet Take 2 tablets (80 mg total) by mouth 2 (two) times daily.  120 tablet  3    Allergies  Allergen Reactions  . Penicillins Anaphylaxis  . Sulfa Antibiotics Anaphylaxis    Patient Active Problem List  Diagnosis  . Ischemic heart disease  . Hypertension  . Hypercholesterolemia  . Hx of CABG  . GERD (gastroesophageal reflux disease)  . CAD (coronary artery disease)  . Hypertensive cardiovascular disease  . Chest pain    History  Smoking status  . Former Smoker  Smokeless tobacco  . Not on file    History  Alcohol Use: Not on file    No family history on file.  Review of Systems: The patient denies any heat or cold intolerance.  No weight gain or weight loss.  The patient denies headaches or blurry vision.  There is no cough or sputum production.  The patient denies dizziness.  There is no hematuria or hematochezia.  The patient denies any muscle aches or arthritis.  The patient denies any rash.  The patient denies frequent falling or instability.  There is no history of depression or anxiety.  All other systems were reviewed and  are negative.   Physical Exam: Filed Vitals:   07/17/12 0916  BP: 118/78  Pulse: 70   the general appearance reveals a well-developed well-nourished gentleman in no distress.The head and neck exam reveals pupils equal and reactive.  Extraocular movements are full.  There is no scleral icterus.  The mouth and pharynx are normal.  The neck is supple.  The carotids reveal no bruits.  The jugular venous pressure is normal.  The  thyroid is not enlarged.  There is no lymphadenopathy.  The chest is clear to percussion and auscultation.  There are no rales or rhonchi.  Expansion of the chest is symmetrical.  The precordium is quiet.  The first heart sound is normal.  The second heart sound is physiologically split.  There is no murmur gallop rub or click.  There is no abnormal lift or heave.  The abdomen is soft  and nontender.  The bowel sounds are normal.  The liver and spleen are not enlarged.  There are no abdominal masses.  There are no abdominal bruits.  Extremities reveal good pedal pulses.  There is no phlebitis or edema.  There is no cyanosis or clubbing.  Strength is normal and symmetrical in all extremities.  There is no lateralizing weakness.  There are no sensory deficits.  The skin is warm and dry.  There is no rash.     Assessment / Plan: Continue same medication but needs to work harder on diet and weight loss.  Recheck in 6 months for office visit EKG and fasting lab work

## 2012-07-17 NOTE — Assessment & Plan Note (Signed)
The patient has not been careful with his diet.  His weight is up 18 pounds since we saw him almost a year ago.  He has been eating a lot of ice cream.  He remains on Lipitor and ezetimibe.

## 2014-04-07 ENCOUNTER — Ambulatory Visit: Payer: Self-pay | Admitting: Family Medicine

## 2014-10-05 ENCOUNTER — Ambulatory Visit: Payer: Self-pay | Admitting: Physician Assistant

## 2014-10-29 ENCOUNTER — Encounter (HOSPITAL_COMMUNITY): Payer: Self-pay | Admitting: Cardiovascular Disease

## 2015-07-12 ENCOUNTER — Other Ambulatory Visit: Payer: Self-pay | Admitting: Internal Medicine

## 2015-07-12 DIAGNOSIS — R319 Hematuria, unspecified: Secondary | ICD-10-CM

## 2015-07-15 ENCOUNTER — Ambulatory Visit
Admission: RE | Admit: 2015-07-15 | Discharge: 2015-07-15 | Disposition: A | Discharge status: Home / Self Care | Payer: BLUE CROSS/BLUE SHIELD | Source: Ambulatory Visit | Attending: Internal Medicine | Admitting: Internal Medicine

## 2015-07-15 ENCOUNTER — Ambulatory Visit: Payer: Self-pay

## 2015-07-15 DIAGNOSIS — R319 Hematuria, unspecified: Secondary | ICD-10-CM | POA: Diagnosis present

## 2017-01-11 DIAGNOSIS — I10 Essential (primary) hypertension: Secondary | ICD-10-CM | POA: Diagnosis not present

## 2017-01-11 DIAGNOSIS — G8929 Other chronic pain: Secondary | ICD-10-CM | POA: Diagnosis not present

## 2017-01-11 DIAGNOSIS — R079 Chest pain, unspecified: Secondary | ICD-10-CM | POA: Diagnosis not present

## 2017-01-16 DIAGNOSIS — I2510 Atherosclerotic heart disease of native coronary artery without angina pectoris: Secondary | ICD-10-CM | POA: Diagnosis not present

## 2017-01-16 DIAGNOSIS — Z125 Encounter for screening for malignant neoplasm of prostate: Secondary | ICD-10-CM | POA: Diagnosis not present

## 2017-01-16 DIAGNOSIS — I10 Essential (primary) hypertension: Secondary | ICD-10-CM | POA: Diagnosis not present

## 2017-08-24 DIAGNOSIS — M25562 Pain in left knee: Secondary | ICD-10-CM | POA: Diagnosis not present

## 2017-08-24 DIAGNOSIS — M25462 Effusion, left knee: Secondary | ICD-10-CM | POA: Diagnosis not present

## 2017-08-29 ENCOUNTER — Other Ambulatory Visit: Payer: Self-pay | Admitting: Sports Medicine

## 2017-08-29 DIAGNOSIS — M25562 Pain in left knee: Secondary | ICD-10-CM

## 2017-09-04 ENCOUNTER — Ambulatory Visit
Admission: RE | Admit: 2017-09-04 | Discharge: 2017-09-04 | Disposition: A | Discharge status: Home / Self Care | Payer: BLUE CROSS/BLUE SHIELD | Source: Ambulatory Visit | Attending: Sports Medicine | Admitting: Sports Medicine

## 2017-09-04 DIAGNOSIS — X58XXXA Exposure to other specified factors, initial encounter: Secondary | ICD-10-CM | POA: Diagnosis not present

## 2017-09-04 DIAGNOSIS — M25562 Pain in left knee: Secondary | ICD-10-CM

## 2017-09-04 DIAGNOSIS — M1712 Unilateral primary osteoarthritis, left knee: Secondary | ICD-10-CM | POA: Diagnosis not present

## 2017-09-04 DIAGNOSIS — S83242A Other tear of medial meniscus, current injury, left knee, initial encounter: Secondary | ICD-10-CM | POA: Diagnosis not present

## 2017-09-10 DIAGNOSIS — S83242D Other tear of medial meniscus, current injury, left knee, subsequent encounter: Secondary | ICD-10-CM | POA: Diagnosis not present

## 2017-09-17 DIAGNOSIS — Z1211 Encounter for screening for malignant neoplasm of colon: Secondary | ICD-10-CM | POA: Diagnosis not present

## 2017-09-17 DIAGNOSIS — K219 Gastro-esophageal reflux disease without esophagitis: Secondary | ICD-10-CM | POA: Diagnosis not present

## 2017-09-20 DIAGNOSIS — S83232A Complex tear of medial meniscus, current injury, left knee, initial encounter: Secondary | ICD-10-CM | POA: Diagnosis not present

## 2017-09-25 DIAGNOSIS — Z01818 Encounter for other preprocedural examination: Secondary | ICD-10-CM | POA: Diagnosis not present

## 2017-10-09 DIAGNOSIS — S83232D Complex tear of medial meniscus, current injury, left knee, subsequent encounter: Secondary | ICD-10-CM | POA: Diagnosis not present

## 2017-10-20 HISTORY — PX: OTHER SURGICAL HISTORY: SHX169

## 2017-10-26 DIAGNOSIS — S82232A Displaced oblique fracture of shaft of left tibia, initial encounter for closed fracture: Secondary | ICD-10-CM | POA: Diagnosis not present

## 2017-10-26 DIAGNOSIS — M6752 Plica syndrome, left knee: Secondary | ICD-10-CM | POA: Diagnosis not present

## 2017-10-26 DIAGNOSIS — Y9389 Activity, other specified: Secondary | ICD-10-CM | POA: Diagnosis not present

## 2017-10-26 DIAGNOSIS — X501XXA Overexertion from prolonged static or awkward postures, initial encounter: Secondary | ICD-10-CM | POA: Diagnosis not present

## 2017-10-26 DIAGNOSIS — S83232A Complex tear of medial meniscus, current injury, left knee, initial encounter: Secondary | ICD-10-CM | POA: Diagnosis not present

## 2017-12-10 ENCOUNTER — Emergency Department
Admission: EM | Admit: 2017-12-10 | Discharge: 2017-12-10 | Disposition: A | Discharge status: Home / Self Care | Payer: BLUE CROSS/BLUE SHIELD | Attending: Emergency Medicine | Admitting: Emergency Medicine

## 2017-12-10 ENCOUNTER — Encounter: Payer: Self-pay | Admitting: Emergency Medicine

## 2017-12-10 ENCOUNTER — Emergency Department: Payer: BLUE CROSS/BLUE SHIELD

## 2017-12-10 ENCOUNTER — Other Ambulatory Visit: Payer: Self-pay

## 2017-12-10 DIAGNOSIS — Z951 Presence of aortocoronary bypass graft: Secondary | ICD-10-CM | POA: Insufficient documentation

## 2017-12-10 DIAGNOSIS — K219 Gastro-esophageal reflux disease without esophagitis: Secondary | ICD-10-CM | POA: Diagnosis not present

## 2017-12-10 DIAGNOSIS — R079 Chest pain, unspecified: Secondary | ICD-10-CM | POA: Diagnosis not present

## 2017-12-10 DIAGNOSIS — Z79899 Other long term (current) drug therapy: Secondary | ICD-10-CM | POA: Diagnosis not present

## 2017-12-10 DIAGNOSIS — I10 Essential (primary) hypertension: Secondary | ICD-10-CM | POA: Insufficient documentation

## 2017-12-10 DIAGNOSIS — I208 Other forms of angina pectoris: Secondary | ICD-10-CM | POA: Diagnosis not present

## 2017-12-10 DIAGNOSIS — Z87891 Personal history of nicotine dependence: Secondary | ICD-10-CM | POA: Diagnosis not present

## 2017-12-10 DIAGNOSIS — Z7982 Long term (current) use of aspirin: Secondary | ICD-10-CM | POA: Diagnosis not present

## 2017-12-10 DIAGNOSIS — I2510 Atherosclerotic heart disease of native coronary artery without angina pectoris: Secondary | ICD-10-CM | POA: Diagnosis not present

## 2017-12-10 LAB — BASIC METABOLIC PANEL
ANION GAP: 8 (ref 5–15)
BUN: 20 mg/dL (ref 6–20)
CALCIUM: 9.2 mg/dL (ref 8.9–10.3)
CO2: 26 mmol/L (ref 22–32)
Chloride: 102 mmol/L (ref 101–111)
Creatinine, Ser: 1.04 mg/dL (ref 0.61–1.24)
GLUCOSE: 98 mg/dL (ref 65–99)
Potassium: 4.4 mmol/L (ref 3.5–5.1)
Sodium: 136 mmol/L (ref 135–145)

## 2017-12-10 LAB — TROPONIN I

## 2017-12-10 LAB — CBC
HCT: 48.3 % (ref 40.0–52.0)
HEMOGLOBIN: 16.4 g/dL (ref 13.0–18.0)
MCH: 30.7 pg (ref 26.0–34.0)
MCHC: 34 g/dL (ref 32.0–36.0)
MCV: 90.2 fL (ref 80.0–100.0)
Platelets: 243 10*3/uL (ref 150–440)
RBC: 5.35 MIL/uL (ref 4.40–5.90)
RDW: 12.7 % (ref 11.5–14.5)
WBC: 8.7 10*3/uL (ref 3.8–10.6)

## 2017-12-10 MED ORDER — AMLODIPINE BESYLATE 5 MG PO TABS
5.0000 mg | ORAL_TABLET | Freq: Once | ORAL | Status: AC
  Administered 2017-12-10: 5 mg via ORAL
  Filled 2017-12-10: qty 1

## 2017-12-10 MED ORDER — ASPIRIN 81 MG PO CHEW
324.0000 mg | CHEWABLE_TABLET | Freq: Once | ORAL | Status: AC
  Administered 2017-12-10: 324 mg via ORAL
  Filled 2017-12-10: qty 4

## 2017-12-10 MED ORDER — SODIUM CHLORIDE 0.9 % IV BOLUS (SEPSIS)
500.0000 mL | Freq: Once | INTRAVENOUS | Status: AC
  Administered 2017-12-10: 500 mL via INTRAVENOUS

## 2017-12-10 MED ORDER — NITROGLYCERIN 0.4 MG SL SUBL: 0.4000 mg | SUBLINGUAL_TABLET | SUBLINGUAL | 0 refills | Status: DC | PRN

## 2017-12-10 MED ORDER — NITROGLYCERIN 0.4 MG SL SUBL
0.4000 mg | SUBLINGUAL_TABLET | SUBLINGUAL | Status: DC | PRN
  Administered 2017-12-10: 0.4 mg via SUBLINGUAL
  Filled 2017-12-10: qty 1

## 2017-12-10 MED ORDER — AMLODIPINE BESYLATE 5 MG PO TABS: 5.0000 mg | ORAL_TABLET | Freq: Every day | ORAL | 1 refills | Status: DC

## 2017-12-10 NOTE — Discharge Instructions (Signed)
As I explained to you I am concerned that your chest pain is coming from your heart. It is very important that you call your cardiologist tomorrow for an appointment this week. In the meantime you may take one sublingual nitroglycerin every 5 minutes up to 3 times for chest pain. Also make sure to start taking amlodipine together with your other medications for better control of your blood pressure. Return to the emergency room if you have new or worsening pain, chest pain at rest, dizziness, shortness of breath, or any other symptoms as these could be sign of a heart attack. If you change your mind and would like to be admitted please return at any time to the emergency room.

## 2017-12-10 NOTE — ED Provider Notes (Signed)
[facility] Emergency Department Provider Note  ____________________________________________  Time seen: Approximately 8:09 PM  I have reviewed the triage vital signs and the nursing notes.   HISTORY  Chief Complaint Chest Pain   HPI Chase Scott is a 62 y.o. male with a history of CAD status post CABG in 2004 on Plavix, hyperlipidemia, hypertension who presents for evaluation of chest pain. Patient reports on and off chest pain since having his CABG 15 years ago. For the last 2 weeks he has had more pronounced chest pain that he describes as a mild to moderate pressure located in the center of his chest, intermittent and nonradiating. The pain is worse with exertion and resolves with rest. No shortness of breath, no dizziness, no nausea, no diaphoresis. Patient reports that the pain today is the most severe is ever been. Currently 6 out of 10. He hasn't tried anything at home for the pain. Has not seen his cardiologist for 6 years. Endorses compliance with his Plavix and aspirin. He is not a smoker. Patient reports that his blood pressure has been very elevated over the last 2 weeks. He has doubled his dose of metoprolol he continues to have elevated blood pressures.He went to his PCP today who sent him over here for evaluation.  Past Medical History:  Diagnosis Date  . CAD (coronary artery disease)   . Chest pain    Rule out myocardial infarction. Rule out acute coronary syndrome.  Marland Kitchen Dyslipidemia   . Fracture    Left shoulder  . Gastroesophageal reflux disease   . Gastrointestinal symptoms    Chronic gastrointestinal symptoms  . Hyperlipidemia   . Hypertensive cardiovascular disease   . Labile hypertension     Patient Active Problem List   Diagnosis Date Noted  . CAD (coronary artery disease) 07/02/2012  . Hypertensive cardiovascular disease 07/02/2012  . Chest pain 07/02/2012  . Ischemic heart disease 02/16/2011  . Hypertension 02/16/2011  .  Hypercholesterolemia 02/16/2011  . Hx of CABG 02/16/2011  . GERD (gastroesophageal reflux disease) 02/16/2011    Past Surgical History:  Procedure Laterality Date  . BACK SURGERY     Lower  . CORONARY ARTERY BYPASS GRAFT  April 2004   x four  . LEFT HEART CATHETERIZATION WITH CORONARY/GRAFT ANGIOGRAM  07/02/2012   Procedure: LEFT HEART CATHETERIZATION WITH Beatrix Fetters;  Surgeon: Burnell Blanks, MD;  Location: [facility] CATH LAB;  Service: Cardiovascular;;    Prior to Admission medications   Medication Sig Start Date End Date Taking? Authorizing Provider  amLODipine (NORVASC) 5 MG tablet Take 1 tablet (5 mg total) by mouth daily. 12/10/17 12/10/18  Rudene Re, MD  atorvastatin (LIPITOR) 80 MG tablet Take 80 mg by mouth daily.      [provider]  clopidogrel (PLAVIX) 75 MG tablet Take 75 mg by mouth daily.      [provider]  ezetimibe (ZETIA) 10 MG tablet Take 10 mg by mouth daily.      [provider]  gabapentin (NEURONTIN) 300 MG capsule Take 300 mg by mouth 2 (two) times daily as needed. pain    [provider]  methocarbamol (ROBAXIN) 750 MG tablet Take 325-750 mg by mouth daily as needed. For chest pain    [provider]  metoprolol (LOPRESSOR) 100 MG tablet Take 50 mg by mouth 3 (three) times daily. Taking daily    [provider]  nitroGLYCERIN (NITROSTAT) 0.4 MG SL tablet Place 1 tablet (0.4 mg total)  under the tongue every 5 (five) minutes as needed for chest pain. 12/10/17 12/10/18  Rudene Re, MD  pantoprazole (PROTONIX) 40 MG tablet Take 40 mg by mouth 2 (two) times daily. 07/02/12 07/02/13  Arguello, Roger A, PA-C  ramipril (ALTACE) 10 MG tablet Take 10 mg by mouth 2 (two) times daily.     [provider]    Allergies Penicillins and Sulfa antibiotics  History reviewed. No pertinent family history.  Social History Social History   Tobacco Use  . Smoking status: Former Research scientist (life sciences)    . Smokeless tobacco: Never Used  Substance Use Topics  . Alcohol use: No    Frequency: Never  . Drug use: No    Review of Systems  Constitutional: Negative for fever. Eyes: Negative for visual changes. ENT: Negative for sore throat. Neck: No neck pain  Cardiovascular: + chest pain. Respiratory: Negative for shortness of breath. Gastrointestinal: Negative for abdominal pain, vomiting or diarrhea. Genitourinary: Negative for dysuria. Musculoskeletal: Negative for back pain. Skin: Negative for rash. Neurological: Negative for headaches, weakness or numbness. Psych: No SI or HI  ____________________________________________   PHYSICAL EXAM:  VITAL SIGNS: ED Triage Vitals  Enc Vitals Group     BP 12/10/17 1512 (!) 159/94     Pulse Rate 12/10/17 1512 68     Resp 12/10/17 1512 18     Temp 12/10/17 1512 98.3 F (36.8 C)     Temp Source 12/10/17 1512 Oral     SpO2 12/10/17 1512 97 %     Weight 12/10/17 1510 230 lb (104.3 kg)     Height 12/10/17 1510 5\' 8"  (1.727 m)     Head Circumference --      Peak Flow --      Pain Score 12/10/17 1510 4     Pain Loc --      Pain Edu? --      Excl. in [place]? --     Constitutional: Alert and oriented. Well appearing and in no apparent distress. HEENT:      Head: Normocephalic and atraumatic.         Eyes: Conjunctivae are normal. Sclera is non-icteric.       Mouth/Throat: Mucous membranes are moist.       Neck: Supple with no signs of meningismus. Cardiovascular: Regular rate and rhythm. No murmurs, gallops, or rubs. 2+ symmetrical distal pulses are present in all extremities. No JVD. Respiratory: Normal respiratory effort. Lungs are clear to auscultation bilaterally. No wheezes, crackles, or rhonchi.  Gastrointestinal: Soft, non tender, and non distended with positive bowel sounds. No rebound or guarding. Musculoskeletal: Nontender with normal range of motion in all extremities. No edema, cyanosis, or erythema of  extremities. Neurologic: Normal speech and language. Face is symmetric. Moving all extremities. No gross focal neurologic deficits are appreciated. Skin: Skin is warm, dry and intact. No rash noted. Psychiatric: Mood and affect are normal. Speech and behavior are normal.  ____________________________________________   LABS (all labs ordered are listed, but only abnormal results are displayed)  Labs Reviewed  BASIC METABOLIC PANEL  CBC  TROPONIN I  TROPONIN I   ____________________________________________  EKG  ED ECG REPORT I, Rudene Re, the attending physician, personally viewed and interpreted this ECG.  14:50 - normal sinus rhythm, rate of 65, normal intervals, normal axis, no ST elevations or depressions. No significant changes from prior from 2013  20:00 - normal sinus rhythm, rate of 66, normal intervals, normal axis, unchanged from initial EKG, no ST elevations  or depressions.  ____________________________________________  RADIOLOGY  CXR:  Negative ____________________________________________   PROCEDURES  Procedure(s) performed: None Procedures Critical Care performed:  None ____________________________________________   INITIAL IMPRESSION / ASSESSMENT AND PLAN / ED COURSE   62 y.o. male with a history of CAD status post CABG in 2004 on Plavix, hyperlipidemia, hypertension who presents for evaluation of chest pain. Patient with history of coronary artery disease who has not seen his cardiologist for 60 years. I am concerned for ACS at this time. EKG 2 with no evidence of ischemia. Troponin 2 is negative. Patient received nitroglycerin with improvement of his blood pressure and resolution of the chest pain. I offered admission to the hospital for further management including stress test and echocardiogram but the patient prefers to go back to [place] for further evaluation as outpatient. At this time I am concerned that presentation is concerning  for stable angina. I will provide patient with a nitroglycerin prescription and I will start him on amlodipine for better control of his blood pressure. I encouraged him to call his cardiologist first thing in the morning for close follow-up. Discussed return precautions for any new or worsening chest pain, dizziness, or shortness of breath. Patient is comfortable with this plan and so is his wife who is at the bedside.      As part of my medical decision making, I reviewed the following data within the University notes reviewed and incorporated, Labs reviewed , EKG interpreted , Old EKG reviewed, Old chart reviewed, Radiograph reviewed , Notes from prior ED visits and [place] Controlled Substance Database    Pertinent labs & imaging results that were available during my care of the patient were reviewed by me and considered in my medical decision making (see chart for details).    ____________________________________________   FINAL CLINICAL IMPRESSION(S) / ED DIAGNOSES  Final diagnoses:  Stable angina ([place])  Essential hypertension      NEW MEDICATIONS STARTED DURING THIS VISIT:  ED Discharge Orders        Ordered    nitroGLYCERIN (NITROSTAT) 0.4 MG SL tablet  Every 5 min PRN     12/10/17 2022    amLODipine (NORVASC) 5 MG tablet  Daily     12/10/17 2022       Note:  This document was prepared using Dragon voice recognition software and may include unintentional dictation errors.    Rudene Re, MD 12/10/17 2024

## 2017-12-10 NOTE — ED Notes (Signed)
ED Provider at bedside. 

## 2017-12-10 NOTE — ED Notes (Signed)
Pt ambulatory upon discharge. Verbalized understanding of discharge instructions, prescriptions and follow-up care. VSS. Skin warm and dry. A&O x4.

## 2017-12-10 NOTE — ED Triage Notes (Signed)
Has had central intermittent chest pain for 15 years but worse over last week.  PCP aware of previous problems and sent to ED for pain. Denies other associated symptoms such as SHOB, couch. Unlabored. Ambulatory. Reports blood pressure has been up some compared to normal.

## 2017-12-12 ENCOUNTER — Observation Stay (HOSPITAL_BASED_OUTPATIENT_CLINIC_OR_DEPARTMENT_OTHER)
Admit: 2017-12-12 | Discharge: 2017-12-12 | Disposition: A | Discharge status: Home / Self Care | Payer: BLUE CROSS/BLUE SHIELD | Attending: Family Medicine | Admitting: Family Medicine

## 2017-12-12 ENCOUNTER — Encounter: Payer: Self-pay | Admitting: Emergency Medicine

## 2017-12-12 ENCOUNTER — Other Ambulatory Visit: Payer: Self-pay

## 2017-12-12 ENCOUNTER — Observation Stay
Admission: EM | Admit: 2017-12-12 | Discharge: 2017-12-14 | Disposition: A | Discharge status: Home / Self Care | Payer: BLUE CROSS/BLUE SHIELD | Attending: Family Medicine | Admitting: Family Medicine

## 2017-12-12 DIAGNOSIS — Z888 Allergy status to other drugs, medicaments and biological substances status: Secondary | ICD-10-CM | POA: Diagnosis not present

## 2017-12-12 DIAGNOSIS — I25119 Atherosclerotic heart disease of native coronary artery with unspecified angina pectoris: Secondary | ICD-10-CM | POA: Diagnosis not present

## 2017-12-12 DIAGNOSIS — I25709 Atherosclerosis of coronary artery bypass graft(s), unspecified, with unspecified angina pectoris: Secondary | ICD-10-CM | POA: Insufficient documentation

## 2017-12-12 DIAGNOSIS — F1021 Alcohol dependence, in remission: Secondary | ICD-10-CM | POA: Diagnosis not present

## 2017-12-12 DIAGNOSIS — I209 Angina pectoris, unspecified: Secondary | ICD-10-CM | POA: Diagnosis present

## 2017-12-12 DIAGNOSIS — Z7902 Long term (current) use of antithrombotics/antiplatelets: Secondary | ICD-10-CM | POA: Diagnosis not present

## 2017-12-12 DIAGNOSIS — Z79899 Other long term (current) drug therapy: Secondary | ICD-10-CM | POA: Insufficient documentation

## 2017-12-12 DIAGNOSIS — I119 Hypertensive heart disease without heart failure: Secondary | ICD-10-CM | POA: Insufficient documentation

## 2017-12-12 DIAGNOSIS — I259 Chronic ischemic heart disease, unspecified: Secondary | ICD-10-CM

## 2017-12-12 DIAGNOSIS — I10 Essential (primary) hypertension: Secondary | ICD-10-CM | POA: Diagnosis not present

## 2017-12-12 DIAGNOSIS — Z87891 Personal history of nicotine dependence: Secondary | ICD-10-CM | POA: Diagnosis not present

## 2017-12-12 DIAGNOSIS — I2582 Chronic total occlusion of coronary artery: Secondary | ICD-10-CM | POA: Insufficient documentation

## 2017-12-12 DIAGNOSIS — Z882 Allergy status to sulfonamides status: Secondary | ICD-10-CM | POA: Diagnosis not present

## 2017-12-12 DIAGNOSIS — R079 Chest pain, unspecified: Secondary | ICD-10-CM | POA: Diagnosis not present

## 2017-12-12 DIAGNOSIS — G8929 Other chronic pain: Secondary | ICD-10-CM | POA: Insufficient documentation

## 2017-12-12 DIAGNOSIS — E785 Hyperlipidemia, unspecified: Secondary | ICD-10-CM | POA: Diagnosis not present

## 2017-12-12 DIAGNOSIS — Z955 Presence of coronary angioplasty implant and graft: Secondary | ICD-10-CM | POA: Diagnosis not present

## 2017-12-12 DIAGNOSIS — Z880 Allergy status to penicillin: Secondary | ICD-10-CM | POA: Insufficient documentation

## 2017-12-12 DIAGNOSIS — R0789 Other chest pain: Secondary | ICD-10-CM | POA: Diagnosis not present

## 2017-12-12 DIAGNOSIS — K219 Gastro-esophageal reflux disease without esophagitis: Secondary | ICD-10-CM | POA: Insufficient documentation

## 2017-12-12 DIAGNOSIS — R0602 Shortness of breath: Secondary | ICD-10-CM | POA: Diagnosis not present

## 2017-12-12 HISTORY — DX: Other ill-defined heart diseases: I51.89

## 2017-12-12 HISTORY — DX: Personal history of (healed) traumatic fracture: Z87.81

## 2017-12-12 LAB — BASIC METABOLIC PANEL
Anion gap: 7 (ref 5–15)
BUN: 20 mg/dL (ref 6–20)
CO2: 29 mmol/L (ref 22–32)
Calcium: 9.7 mg/dL (ref 8.9–10.3)
Chloride: 102 mmol/L (ref 101–111)
Creatinine, Ser: 1.16 mg/dL (ref 0.61–1.24)
GFR calc Af Amer: >60 mL/min (ref >60)
GFR calc non Af Amer: >60 mL/min (ref >60)
Glucose, Bld: 130 mg/dL — ABNORMAL HIGH (ref 65–99)
Potassium: 4.4 mmol/L (ref 3.5–5.1)
Sodium: 138 mmol/L (ref 135–145)

## 2017-12-12 LAB — TROPONIN I
Troponin I: <0.03 ng/mL (ref <0.03)
Troponin I: <0.03 ng/mL (ref <0.03)
Troponin I: <0.03 ng/mL (ref <0.03)

## 2017-12-12 LAB — CBC
HCT: 48 % (ref 40.0–52.0)
Hemoglobin: 16.6 g/dL (ref 13.0–18.0)
MCH: 31 pg (ref 26.0–34.0)
MCHC: 34.5 g/dL (ref 32.0–36.0)
MCV: 89.7 fL (ref 80.0–100.0)
Platelets: 251 10*3/uL (ref 150–440)
RBC: 5.35 MIL/uL (ref 4.40–5.90)
RDW: 12.6 % (ref 11.5–14.5)
WBC: 8.3 10*3/uL (ref 3.8–10.6)

## 2017-12-12 MED ORDER — EZETIMIBE 10 MG PO TABS
10.0000 mg | ORAL_TABLET | Freq: Every day | ORAL | Status: DC
  Administered 2017-12-12 – 2017-12-13 (×2): 10 mg via ORAL
  Filled 2017-12-12 (×2): qty 1

## 2017-12-12 MED ORDER — THIAMINE HCL 100 MG/ML IJ SOLN: 100.0000 mg | Freq: Every day | INTRAMUSCULAR | Status: DC

## 2017-12-12 MED ORDER — ONDANSETRON HCL 4 MG/2ML IJ SOLN: 4.0000 mg | Freq: Four times a day (QID) | INTRAMUSCULAR | Status: DC | PRN

## 2017-12-12 MED ORDER — AMLODIPINE BESYLATE 5 MG PO TABS
5.0000 mg | ORAL_TABLET | Freq: Every day | ORAL | Status: DC
  Administered 2017-12-13: 5 mg via ORAL
  Filled 2017-12-12: qty 1

## 2017-12-12 MED ORDER — HYDRALAZINE HCL 20 MG/ML IJ SOLN: 10.0000 mg | INTRAMUSCULAR | Status: DC | PRN

## 2017-12-12 MED ORDER — ALPRAZOLAM 0.25 MG PO TABS: 0.2500 mg | ORAL_TABLET | Freq: Two times a day (BID) | ORAL | Status: DC | PRN

## 2017-12-12 MED ORDER — ACETAMINOPHEN 325 MG PO TABS: 650.0000 mg | ORAL_TABLET | ORAL | Status: DC | PRN

## 2017-12-12 MED ORDER — LORAZEPAM 1 MG PO TABS: 1.0000 mg | ORAL_TABLET | Freq: Four times a day (QID) | ORAL | Status: DC | PRN

## 2017-12-12 MED ORDER — MORPHINE SULFATE (PF) 2 MG/ML IV SOLN: 2.0000 mg | INTRAVENOUS | Status: DC | PRN

## 2017-12-12 MED ORDER — FOLIC ACID 1 MG PO TABS
1.0000 mg | ORAL_TABLET | Freq: Every day | ORAL | Status: DC
  Administered 2017-12-12 – 2017-12-13 (×2): 1 mg via ORAL
  Filled 2017-12-12 (×2): qty 1

## 2017-12-12 MED ORDER — NITROGLYCERIN 2 % TD OINT
0.5000 [in_us] | TOPICAL_OINTMENT | Freq: Once | TRANSDERMAL | Status: AC
Start: 2017-12-12 — End: 2017-12-12
  Administered 2017-12-12: 0.5 [in_us] via TOPICAL
  Filled 2017-12-12: qty 1

## 2017-12-12 MED ORDER — CLOPIDOGREL BISULFATE 75 MG PO TABS
75.0000 mg | ORAL_TABLET | Freq: Every day | ORAL | Status: DC
  Administered 2017-12-12 – 2017-12-14 (×3): 75 mg via ORAL
  Filled 2017-12-12 (×3): qty 1

## 2017-12-12 MED ORDER — ENOXAPARIN SODIUM 40 MG/0.4ML ~~LOC~~ SOLN
40.0000 mg | SUBCUTANEOUS | Status: DC
  Administered 2017-12-13: 40 mg via SUBCUTANEOUS
  Filled 2017-12-12: qty 0.4

## 2017-12-12 MED ORDER — LORAZEPAM 2 MG/ML IJ SOLN: 1.0000 mg | Freq: Four times a day (QID) | INTRAMUSCULAR | Status: DC | PRN

## 2017-12-12 MED ORDER — METOPROLOL SUCCINATE ER 50 MG PO TB24
150.0000 mg | ORAL_TABLET | Freq: Every day | ORAL | Status: DC
  Administered 2017-12-13: 150 mg via ORAL
  Filled 2017-12-12: qty 1

## 2017-12-12 MED ORDER — PERFLUTREN LIPID MICROSPHERE
1.0000 mL | INTRAVENOUS | Status: AC | PRN
  Administered 2017-12-12: 2 mL via INTRAVENOUS

## 2017-12-12 MED ORDER — ADULT MULTIVITAMIN W/MINERALS CH
1.0000 | ORAL_TABLET | Freq: Every day | ORAL | Status: DC
  Administered 2017-12-12 – 2017-12-13 (×2): 1 via ORAL
  Filled 2017-12-12 (×2): qty 1

## 2017-12-12 MED ORDER — GI COCKTAIL ~~LOC~~
30.0000 mL | Freq: Four times a day (QID) | ORAL | Status: DC | PRN
  Filled 2017-12-12: qty 30

## 2017-12-12 MED ORDER — ASPIRIN EC 81 MG PO TBEC
81.0000 mg | DELAYED_RELEASE_TABLET | Freq: Every day | ORAL | Status: DC
  Administered 2017-12-12 – 2017-12-14 (×3): 81 mg via ORAL
  Filled 2017-12-12 (×3): qty 1

## 2017-12-12 MED ORDER — PANTOPRAZOLE SODIUM 40 MG PO TBEC
40.0000 mg | DELAYED_RELEASE_TABLET | Freq: Every day | ORAL | Status: DC
  Administered 2017-12-12 – 2017-12-13 (×2): 40 mg via ORAL
  Filled 2017-12-12 (×2): qty 1

## 2017-12-12 MED ORDER — NITROGLYCERIN 2 % TD OINT
1.0000 [in_us] | TOPICAL_OINTMENT | Freq: Four times a day (QID) | TRANSDERMAL | Status: DC
  Administered 2017-12-12 – 2017-12-14 (×8): 1 [in_us] via TOPICAL
  Filled 2017-12-12 (×8): qty 1

## 2017-12-12 MED ORDER — LISINOPRIL 10 MG PO TABS
10.0000 mg | ORAL_TABLET | Freq: Every day | ORAL | Status: DC
  Administered 2017-12-12 – 2017-12-13 (×2): 10 mg via ORAL
  Filled 2017-12-12 (×2): qty 1

## 2017-12-12 MED ORDER — VITAMIN B-1 100 MG PO TABS
100.0000 mg | ORAL_TABLET | Freq: Every day | ORAL | Status: DC
  Administered 2017-12-12 – 2017-12-13 (×2): 100 mg via ORAL
  Filled 2017-12-12 (×2): qty 1

## 2017-12-12 MED ORDER — SUCRALFATE 1 G PO TABS
1.0000 g | ORAL_TABLET | Freq: Four times a day (QID) | ORAL | Status: DC
  Administered 2017-12-12 – 2017-12-14 (×7): 1 g via ORAL
  Filled 2017-12-12 (×7): qty 1

## 2017-12-12 NOTE — H&P (Signed)
[name] NAME: Chase Scott    MR#:  [id number]  DATE OF BIRTH:  1956-05-15  DATE OF ADMISSION:  12/12/2017  PRIMARY CARE PHYSICIAN: Katheren Shams   REQUESTING/REFERRING PHYSICIAN:   CHIEF COMPLAINT:   Chief Complaint  Patient presents with  . Chest Pain    HISTORY OF PRESENT ILLNESS: Chase Scott  is a 62 y.o. male with a known history per below which also includes coronary artery disease, status post CABG in 2004, coronary artery stenting x3, chronic chest pain since 2004, most recent stress testing in 2013 or 14 was a normal study per patient, presents with acute on chronic worsening chest pain over the last 24 hours worse with exertion and associated with nausea, shortness of breath, nonradiating dull mid chest pain which is constant, radiating to left arm, made better with nitroglycerin, in the emergency room workup was unimpressive, patient evaluated in the emergency room, wife at the bedside, patient continues to complain of mild to moderate persistent chest pain, patient is now been admitted for acute chest pain which is alleviated with nitroglycerin.  PAST MEDICAL HISTORY:   Past Medical History:  Diagnosis Date  . CAD (coronary artery disease)   . Chest pain    Rule out myocardial infarction. Rule out acute coronary syndrome.  Marland Kitchen Dyslipidemia   . Fracture    Left shoulder  . Gastroesophageal reflux disease   . Gastrointestinal symptoms    Chronic gastrointestinal symptoms  . Hyperlipidemia   . Hypertensive cardiovascular disease   . Labile hypertension     PAST SURGICAL HISTORY:  Past Surgical History:  Procedure Laterality Date  . BACK SURGERY     Lower  . CORONARY ARTERY BYPASS GRAFT  April 2004   x four  . LEFT HEART CATHETERIZATION WITH CORONARY/GRAFT ANGIOGRAM  07/02/2012   Procedure: LEFT HEART CATHETERIZATION WITH Beatrix Fetters;  Surgeon: Burnell Blanks, MD;  Location: [facility] CATH  LAB;  Service: Cardiovascular;;    SOCIAL HISTORY:  Social History   Tobacco Use  . Smoking status: Former Research scientist (life sciences)  . Smokeless tobacco: Never Used  Substance Use Topics  . Alcohol use: No    Frequency: Never    FAMILY HISTORY: History reviewed. No pertinent family history.  DRUG ALLERGIES:  Allergies  Allergen Reactions  . Penicillins Anaphylaxis  . Sulfa Antibiotics Anaphylaxis    REVIEW OF SYSTEMS:   CONSTITUTIONAL: No fever, fatigue or weakness.  EYES: No blurred or double vision.  EARS, NOSE, AND THROAT: No tinnitus or ear pain.  RESPIRATORY: No cough, shortness of breath, wheezing or hemoptysis.  CARDIOVASCULAR: No chest pain, orthopnea, edema.  GASTROINTESTINAL: No nausea, vomiting, diarrhea or abdominal pain.  GENITOURINARY: No dysuria, hematuria.  ENDOCRINE: No polyuria, nocturia,  HEMATOLOGY: No anemia, easy bruising or bleeding SKIN: No rash or lesion. MUSCULOSKELETAL: No joint pain or arthritis.   NEUROLOGIC: No tingling, numbness, weakness.  PSYCHIATRY: No anxiety or depression.   MEDICATIONS AT HOME:  Prior to Admission medications   Medication Sig Start Date End Date Taking? Authorizing Provider  amLODipine (NORVASC) 5 MG tablet Take 1 tablet (5 mg total) by mouth daily. 12/10/17 12/10/18 Yes [place], [place], MD  clopidogrel (PLAVIX) 75 MG tablet Take 75 mg by mouth daily.     Yes [provider]  esomeprazole (NEXIUM) 20 MG capsule Take 20 mg by mouth daily.   Yes [provider]  ezetimibe (ZETIA) 10 MG tablet Take 10 mg by mouth daily.  Yes [provider]  nitroGLYCERIN (NITROSTAT) 0.4 MG SL tablet Place 1 tablet (0.4 mg total) under the tongue every 5 (five) minutes as needed for chest pain. 12/10/17 12/10/18 Yes [place], [place], MD  sucralfate (CARAFATE) 1 g tablet Take 1 g by mouth 4 (four) times daily.   Yes [provider]  TOPROL XL 100 MG 24 hr tablet Take 150 mg by mouth daily. 10/30/17  Yes [provider]      PHYSICAL EXAMINATION:   VITAL SIGNS: Blood pressure (!) 160/89, pulse 69, temperature 98.1 F (36.7 C), temperature source Oral, resp. rate 15, height 5\' 8"  (1.727 m), weight 104.3 kg (230 lb), SpO2 97 %.  GENERAL:  62 y.o.-year-old patient lying in the bed with no acute distress.  EYES: Pupils equal, round, reactive to light and accommodation. No scleral icterus. Extraocular muscles intact.  HEENT: Head atraumatic, normocephalic. Oropharynx and nasopharynx clear.  NECK:  Supple, no jugular venous distention. No thyroid enlargement, no tenderness.  LUNGS: Normal breath sounds bilaterally, no wheezing, rales,rhonchi or crepitation. No use of accessory muscles of respiration.  CARDIOVASCULAR: S1, S2 normal. No murmurs, rubs, or gallops.  ABDOMEN: Soft, nontender, nondistended. Bowel sounds present. No organomegaly or mass.  EXTREMITIES: No pedal edema, cyanosis, or clubbing.  NEUROLOGIC: Cranial nerves II through XII are intact. Muscle strength 5/5 in all extremities. Sensation intact. Gait not checked.  PSYCHIATRIC: The patient is alert and oriented x 3.  SKIN: No obvious rash, lesion, or ulcer.   LABORATORY PANEL:   CBC Recent Labs  Lab 12/10/17 1511 12/12/17 1226  WBC 8.7 8.3  HGB 16.4 16.6  HCT 48.3 48.0  PLT 243 251  MCV 90.2 89.7  MCH 30.7 31.0  MCHC 34.0 34.5  RDW 12.7 12.6   ------------------------------------------------------------------------------------------------------------------  Chemistries  Recent Labs  Lab 12/10/17 1511 12/12/17 1226  NA 136 138  K 4.4 4.4  CL 102 102  CO2 26 29  GLUCOSE 98 130*  BUN 20 20  CREATININE 1.04 1.16  CALCIUM 9.2 9.7   ------------------------------------------------------------------------------------------------------------------ estimated creatinine clearance is 78.3 mL/min (by C-G formula based on SCr of 1.16  mg/dL). ------------------------------------------------------------------------------------------------------------------ No results for input(s): TSH, T4TOTAL, T3FREE, THYROIDAB in the last 72 hours.  Invalid input(s): FREET3   Coagulation profile No results for input(s): INR, PROTIME in the last 168 hours. ------------------------------------------------------------------------------------------------------------------- No results for input(s): DDIMER in the last 72 hours. -------------------------------------------------------------------------------------------------------------------  Cardiac Enzymes Recent Labs  Lab 12/10/17 1511 12/10/17 1851 12/12/17 1226  TROPONINI <0.03 <0.03 <0.03   ------------------------------------------------------------------------------------------------------------------ Invalid input(s): POCBNP  ---------------------------------------------------------------------------------------------------------------  Urinalysis No results found for: COLORURINE, APPEARANCEUR, LABSPEC, PHURINE, GLUCOSEU, HGBUR, BILIRUBINUR, KETONESUR, PROTEINUR, UROBILINOGEN, NITRITE, LEUKOCYTESUR   RADIOLOGY: Dg Chest 2 View  Result Date: 12/10/2017 CLINICAL DATA:  Increased central chest pain for 1 week EXAM: CHEST  2 VIEW COMPARISON:  August 06, 2011 FINDINGS: The heart size and mediastinal contours are stable. There is no focal infiltrate, pulmonary edema, or pleural effusion. The visualized skeletal structures are unremarkable. IMPRESSION: No active cardiopulmonary disease. Electronically Signed   By: Abelardo Diesel M.D.   On: 12/10/2017 15:33    EKG: Orders placed or performed during the hospital encounter of 12/12/17  . EKG 12-Lead  . EKG 12-Lead  . ED EKG within 10 minutes  . ED EKG within 10 minutes    IMPRESSION AND PLAN: 1 acute on chronic chest pain Status post CABG in 2004, coronary artery stenting x3, last stress test 11/1998 13/14 which was a  normal study  per patient, alleviated with nitroglycerin, radiating into left arm, associated with nausea, shortness of breath Referred to the observation unit on our ACS protocol, consult cardiology for expert opinion, cycle cardiac enzymes x3 sets-if negative would proceed with stress testing in the morning, check echocardiogram, DAPT with aspirin/Plavix, intolerant to statin therapy, check lipids in the morning, Toprol-XL, add lisinopril, supplemental oxygen as needed, schedule nitroglycerin 1 inch to chest every 6 hours, IV morphine for breakthrough pain, and continue close medical monitoring  2 chronic benign essential hypertension Mildly elevated Continue current regiment, hydralazine IV as needed systolic blood pressure greater than 160, vitals per routine, make changes as per necessary  3 chronic GERD without esophagitis Stable PPI daily  4 history of alcoholism Stable Placed on alcohol withdrawal protocol  Full code  All the records are reviewed and case discussed with ED provider. Management plans discussed with the patient, family and they are in agreement.  CODE STATUS: Code Status History    This patient does not have a recorded code status. Please follow your organizational policy for patients in this situation.       TOTAL TIME TAKING CARE OF THIS PATIENT: 45 minutes.    Avel Peace Salary M.D on 12/12/2017   Between 7am to 6pm - Pager - [phone]  After 6pm go to [email or web address] - password [email or web address] [facility]  Office  [phone]  CC: Primary care physician; Katheren Shams   Note: This dictation was prepared with Dragon dictation along with smaller phrase technology. Any transcriptional errors that result from this process are unintentional.

## 2017-12-12 NOTE — ED Notes (Signed)
FIRST NURSE NOTE: pt c/o CP , was seen 1/21 and dx with angina, states was told to follow back if CP started again. Pt in wheelchair to triage room for EKG

## 2017-12-12 NOTE — ED Triage Notes (Signed)
Seen here Monday for CP that is somewhat chronic but has been worse over last week.  Worse with exertion.  Pain to back of left arm last night.  Took NTG last night and pain did improve.  Unlabored.  VSS. Color WNL. Still having same pains as Monday when here.

## 2017-12-12 NOTE — ED Notes (Signed)
Bedside Report given to [facility]

## 2017-12-12 NOTE — ED Notes (Addendum)
Attempted to call report. Altha Harm states they will call back for report.

## 2017-12-12 NOTE — ED Provider Notes (Signed)
[facility] Emergency Department Provider Note  Time seen: 1:53 PM  I have reviewed the triage vital signs and the nursing notes.   HISTORY  Chief Complaint Chest Pain    HPI Chase Scott is a 62 y.o. male with a past medical history of CAD, chest pain, hypertension, hyperlipidemia, states multiple stents prior to his CABG approximately 15 years ago, presents to the emergency department for chest pain.  According to the patient for the past 1 week he has been experiencing progressively worsening chest pain.  States it typically occurs with minimal exertion, has been relieved twice with nitroglycerin.  He states occasionally he will become nauseated, denies any diaphoresis.  Denies any significant shortness of breath.  Patient is also concerned that his blood pressure has been running elevated around 242 systolic.  Was seen in the emergency department 2 days ago for chest pain, had a negative workup and was discharged home states the chest pain worsened last night relieved with nitroglycerin, then again today.  Continues to state moderate chest discomfort currently.  Some shortness of breath but only with exertion per patient.  Denies any cough, fever.  Denies leg pain or swelling.   Past Medical History:  Diagnosis Date  . CAD (coronary artery disease)   . Chest pain    Rule out myocardial infarction. Rule out acute coronary syndrome.  Marland Kitchen Dyslipidemia   . Fracture    Left shoulder  . Gastroesophageal reflux disease   . Gastrointestinal symptoms    Chronic gastrointestinal symptoms  . Hyperlipidemia   . Hypertensive cardiovascular disease   . Labile hypertension     Patient Active Problem List   Diagnosis Date Noted  . CAD (coronary artery disease) 07/02/2012  . Hypertensive cardiovascular disease 07/02/2012  . Chest pain 07/02/2012  . Ischemic heart disease 02/16/2011  . Hypertension 02/16/2011  . Hypercholesterolemia 02/16/2011  . Hx of CABG  02/16/2011  . GERD (gastroesophageal reflux disease) 02/16/2011    Past Surgical History:  Procedure Laterality Date  . BACK SURGERY     Lower  . CORONARY ARTERY BYPASS GRAFT  April 2004   x four  . LEFT HEART CATHETERIZATION WITH CORONARY/GRAFT ANGIOGRAM  07/02/2012   Procedure: LEFT HEART CATHETERIZATION WITH Beatrix Fetters;  Surgeon: Burnell Blanks, MD;  Location: [facility] CATH LAB;  Service: Cardiovascular;;    Prior to Admission medications   Medication Sig Start Date End Date Taking? Authorizing Provider  amLODipine (NORVASC) 5 MG tablet Take 1 tablet (5 mg total) by mouth daily. 12/10/17 12/10/18  Rudene Re, MD  atorvastatin (LIPITOR) 80 MG tablet Take 80 mg by mouth daily.      [provider]  clopidogrel (PLAVIX) 75 MG tablet Take 75 mg by mouth daily.      [provider]  ezetimibe (ZETIA) 10 MG tablet Take 10 mg by mouth daily.      [provider]  gabapentin (NEURONTIN) 300 MG capsule Take 300 mg by mouth 2 (two) times daily as needed. pain    [provider]  methocarbamol (ROBAXIN) 750 MG tablet Take 325-750 mg by mouth daily as needed. For chest pain    [provider]  metoprolol (LOPRESSOR) 100 MG tablet Take 50 mg by mouth 3 (three) times daily. Taking daily    [provider]  nitroGLYCERIN (NITROSTAT) 0.4 MG SL tablet Place 1 tablet (0.4 mg total) under the tongue every 5 (five) minutes as needed for chest pain. 12/10/17 12/10/18  Alfred Levins,  [place], MD  pantoprazole (PROTONIX) 40 MG tablet Take 40 mg by mouth 2 (two) times daily. 07/02/12 07/02/13  Arguello, Roger A, PA-C  ramipril (ALTACE) 10 MG tablet Take 10 mg by mouth 2 (two) times daily.     [provider]    Allergies  Allergen Reactions  . Penicillins Anaphylaxis  . Sulfa Antibiotics Anaphylaxis    History reviewed. No pertinent family history.  Social History Social History   Tobacco Use  . Smoking status: Former  Research scientist (life sciences)  . Smokeless tobacco: Never Used  Substance Use Topics  . Alcohol use: No    Frequency: Never  . Drug use: No    Review of Systems Constitutional: Negative for fever. Eyes: Negative for visual complaints ENT: Negative for recent illness/congestion Cardiovascular: Positive for chest pain intermittent times 1 week, worse over the past 2-3 days. Respiratory: No shortness of breath currently, positive for shortness of breath with exertion per patient. Gastrointestinal: Negative for abdominal pain, vomiting.  Occasional nausea. Genitourinary: Negative for urinary compaints Musculoskeletal: Negative for leg pain or swelling Skin: Negative for skin complaints  Neurological: Negative for headache All other ROS negative  ____________________________________________   PHYSICAL EXAM:  VITAL SIGNS: ED Triage Vitals  Enc Vitals Group     BP 12/12/17 1228 (!) 160/89     Pulse Rate 12/12/17 1228 77     Resp 12/12/17 1228 16     Temp 12/12/17 1228 98.1 F (36.7 C)     Temp Source 12/12/17 1228 Oral     SpO2 12/12/17 1228 99 %     Weight 12/12/17 1231 230 lb (104.3 kg)     Height 12/12/17 1231 5\' 8"  (1.727 m)     Head Circumference --      Peak Flow --      Pain Score 12/12/17 1231 5     Pain Loc --      Pain Edu? --      Excl. in [place]? --    Constitutional: Alert and oriented. Well appearing and in no distress. Eyes: Normal exam ENT   Head: Normocephalic and atraumatic.   Mouth/Throat: Mucous membranes are moist. Cardiovascular: Normal rate, regular rhythm. No murmur Respiratory: Normal respiratory effort without tachypnea nor retractions. Breath sounds are clear  Gastrointestinal: Soft and nontender. No distention.   Musculoskeletal: Nontender with normal range of motion in all extremities. No lower extremity tenderness or edema. Neurologic:  Normal speech and language. No gross focal neurologic deficits Skin:  Skin is warm, dry and intact.  Psychiatric: Mood and  affect are normal  ____________________________________________    EKG  EKG reviewed and interpreted by myself shows sinus rhythm at 78 bpm with a narrow QRS, normal axis, normal intervals, nonspecific ST changes but no ST elevation.  ____________________________________________    RADIOLOGY  X-ray performed 2 days ago within normal limits.  ____________________________________________   INITIAL IMPRESSION / ASSESSMENT AND PLAN / ED COURSE  Pertinent labs & imaging results that were available during my care of the patient were reviewed by me and considered in my medical decision making (see chart for details).  Patient presents to the emergency department for chest pain, intermittent over the past 1 week worse over the past 48 hours relieved twice with nitroglycerin.  States moderate chest discomfort currently which she describes as bandlike across his entire chest with occasional radiation to his left arm.  Differential would include ACS, NSTEMI, pneumonia, pneumothorax, angina.  Overall the patient appears well, x-ray from 2 days ago  is normal do not suspect pneumonia or pneumothorax.  Patient's labs including troponin are negative.  EKG shows nonspecific but no concerning findings.  Given the patient's progressively worsening symptoms now with occasional shortness of breath nausea and left arm radiation we will discuss with cardiology for likely admission to the hospital.  We will place nitroglycerin ointment on the patient and continue to closely monitor.  Blood pressure currently 620 systolic.  States moderate chest discomfort.  Given the patient's concerning chest pain story despite normal workup we will place nitroglycerin ointment on the patient for symptom relief and blood pressure control.  We will admit to the hospital for further workup.  Patient agreeable to this plan of care.  ____________________________________________   FINAL CLINICAL IMPRESSION(S) / ED  DIAGNOSES  Chest pain    Harvest Dark, MD 12/12/17 1427

## 2017-12-12 NOTE — ED Notes (Signed)
Called and spoke with Altha Harm and notified her that I was on way up to room 235 with pt to give bedside report.

## 2017-12-13 ENCOUNTER — Observation Stay (HOSPITAL_BASED_OUTPATIENT_CLINIC_OR_DEPARTMENT_OTHER): Payer: BLUE CROSS/BLUE SHIELD

## 2017-12-13 ENCOUNTER — Encounter: Payer: Self-pay | Admitting: Nurse Practitioner

## 2017-12-13 DIAGNOSIS — I209 Angina pectoris, unspecified: Secondary | ICD-10-CM | POA: Diagnosis not present

## 2017-12-13 DIAGNOSIS — R079 Chest pain, unspecified: Secondary | ICD-10-CM | POA: Diagnosis not present

## 2017-12-13 DIAGNOSIS — I10 Essential (primary) hypertension: Secondary | ICD-10-CM | POA: Diagnosis not present

## 2017-12-13 DIAGNOSIS — I200 Unstable angina: Secondary | ICD-10-CM | POA: Diagnosis not present

## 2017-12-13 DIAGNOSIS — K219 Gastro-esophageal reflux disease without esophagitis: Secondary | ICD-10-CM | POA: Diagnosis not present

## 2017-12-13 DIAGNOSIS — F1021 Alcohol dependence, in remission: Secondary | ICD-10-CM | POA: Diagnosis not present

## 2017-12-13 LAB — LIPID PANEL
CHOL/HDL RATIO: 4.8 ratio
Cholesterol: 186 mg/dL (ref 0–200)
HDL: 39 mg/dL — AB (ref >40)
LDL CALC: 125 mg/dL — AB (ref 0–99)
Triglycerides: 109 mg/dL (ref <150)
VLDL: 22 mg/dL (ref 0–40)

## 2017-12-13 LAB — NM MYOCAR MULTI W/SPECT W/WALL MOTION / EF
CHL CUP RESTING HR STRESS: 91 {beats}/min
CSEPED: 0 min
CSEPEDS: 0 s
CSEPEW: 1 METS
LV dias vol: 79 mL (ref 62–150)
LV sys vol: 34 mL
MPHR: 159 {beats}/min
Peak HR: 134 {beats}/min
Percent HR: 84 %
SDS: 1
SRS: 11
SSS: 12
TID: 1.03

## 2017-12-13 LAB — ECHOCARDIOGRAM COMPLETE
HEIGHTINCHES: 68 in
WEIGHTICAEL: 3572.8 [oz_av]

## 2017-12-13 LAB — HIV ANTIBODY (ROUTINE TESTING W REFLEX): HIV SCREEN 4TH GENERATION: NONREACTIVE

## 2017-12-13 MED ORDER — SODIUM CHLORIDE 0.9 % IV SOLN: 250.0000 mL | INTRAVENOUS | Status: DC | PRN

## 2017-12-13 MED ORDER — SODIUM CHLORIDE 0.9% FLUSH
3.0000 mL | Freq: Two times a day (BID) | INTRAVENOUS | Status: DC
  Administered 2017-12-13: 3 mL via INTRAVENOUS

## 2017-12-13 MED ORDER — SODIUM CHLORIDE 0.9% FLUSH
3.0000 mL | INTRAVENOUS | Status: DC | PRN
  Administered 2017-12-13: 3 mL via INTRAVENOUS
  Filled 2017-12-13: qty 3

## 2017-12-13 MED ORDER — TECHNETIUM TC 99M TETROFOSMIN IV KIT
14.0300 | PACK | Freq: Once | INTRAVENOUS | Status: AC | PRN
  Administered 2017-12-13: 14.03 via INTRAVENOUS

## 2017-12-13 MED ORDER — SODIUM CHLORIDE 0.9 % WEIGHT BASED INFUSION
1.0000 mL/kg/h | INTRAVENOUS | Status: DC
  Administered 2017-12-14: 1 mL/kg/h via INTRAVENOUS

## 2017-12-13 MED ORDER — SODIUM CHLORIDE 0.9 % WEIGHT BASED INFUSION
3.0000 mL/kg/h | INTRAVENOUS | Status: DC
  Administered 2017-12-14: 3 mL/kg/h via INTRAVENOUS

## 2017-12-13 MED ORDER — REGADENOSON 0.4 MG/5ML IV SOLN
0.4000 mg | Freq: Once | INTRAVENOUS | Status: AC
  Administered 2017-12-13: 0.4 mg via INTRAVENOUS

## 2017-12-13 MED ORDER — TECHNETIUM TC 99M TETROFOSMIN IV KIT
32.0100 | PACK | Freq: Once | INTRAVENOUS | Status: AC | PRN
  Administered 2017-12-13: 32.01 via INTRAVENOUS

## 2017-12-13 NOTE — Consult Note (Signed)
Cardiology Consult    Patient ID: CAI ANFINSON MRN: [id number], DOB/AGE: 1956-11-09   Admit date: 12/12/2017 Date of Consult: 12/13/2017  Primary Physician: No primary care provider on file. Primary Cardiologist: [name], MD-new Requesting Provider: Ashok Norris, MD  Patient Profile    Chase Scott is a 62 y.o. male with a history of CAD s/p prior CABG with chronic chest pain ever since, diastolic dysfxn, HTN, HL, and GERD, who is being seen today for the evaluation of worsening chest pain at the request of Dr. Jerelyn Charles.  Past Medical History   Past Medical History:  Diagnosis Date  . CAD (coronary artery disease)    a. 01/2003 s/p PCI to RCA;  b. 02/2003 s/ CABG x 4 (LIMA->LAD, RIMA->RCA, VG->Diag, VG->LCX; c. 2005 s/p PCI to OM2; d. 06/2012 Cath: LM nl, LAD 118m, LCX mild plaque, OM1 min irregs, OM2 patent stent, RCA 72m ISR, VG->LCX 100, VG->Diag ok, RIMA->RCA 100, LIMA->LAD ok, EF 50-55%-->Med Rx.  . Chronic Chest pain   . Diastolic dysfunction    a. 11/2017 Echo: EF 50-55%, no rwma, Gr1 DD, mildly dil LA.  Marland Kitchen Dyslipidemia   . Gastroesophageal reflux disease   . Gastrointestinal symptoms    Chronic gastrointestinal symptoms  . History of left shoulder fracture   . Hyperlipidemia   . Hypertensive cardiovascular disease    a. Labile BPs.    Past Surgical History:  Procedure Laterality Date  . Arthroscopic Knee Surgery Right 2009  . Arthroscopic Knee Surgery Left 10/2017  . BACK SURGERY     Lower  . CORONARY ARTERY BYPASS GRAFT  April 2004   x four  . LEFT HEART CATHETERIZATION WITH CORONARY/GRAFT ANGIOGRAM  07/02/2012   Procedure: LEFT HEART CATHETERIZATION WITH Beatrix Fetters;  Surgeon: Burnell Blanks, MD;  Location: [facility] CATH LAB;  Service: Cardiovascular;;     Allergies  Allergies  Allergen Reactions  . Penicillins Anaphylaxis  . Sulfa Antibiotics Anaphylaxis    History of Present Illness    62 y/o ? with the above complex PMH including  CAD, HTN, HL, diast dysfxn, and GERD.  Cardiac hx dates back to ealry 2004 when he underwent stenting of the RCA.  He says that he had c/p afterwards and three weeks later he suffered a syncopal spell.  He ended up @ [facility] and subsequent cath revealed severe native multi-vessel dzs.  He underwent CABG x 4 @ that time (details of that admission are unavailable).  Following CABG, he noted daily, low-level chest pain. He was evaluated @ [facility] in 2005 and presumably found to have occlusive dzs of the VG  LCX, as he underwent PCI of the OM2 @ that time.  His last cath was performed in 06/2012 @ [facility] and revealed an occluded RIMA  RCA and VG  LCX,, occluded native LAD with patent LIMA  LAD, and patent stents in the OM2 and RCA.  EF was nl and he has been medically managed since.  He was previously followed by Vaughan Browner, MD in our [place] office, but has been lost to f/u since 2013.  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 About once a week or so, his chest pain will worsen with activity, becoming more tight and sometimes assoc w/ dyspnea.  This will typically last between 5 and 15 minutes and resolve with rest.  It has been this way for approximately 14-15 years.  In December 2018, he underwent left knee arthroscopic surgery.  He says he recovered pretty well.  He did come off of Plavix for 5 days prior to surgery but has been on it since.  He has been steadily increasing his activity at about 7-10 days ago, he began to note more frequent episodes of worsening chest tightness and burning with ambulation.  What was occurring about once a week began to occur just about every day.  Prior to returning to work, he was due for a physical by primary care and he was seen on January 21.  He was hypertensive and reported episodes of chest discomfort and was sent to the  emergency department.  There, ECG was nonacute and troponins were normal.  He was advised to be admitted but instead, he requested discharge and arranged for follow-up with Dr. Fletcher Anon on February 5.  Following ER visit on the 21st, he continued to have constant, low-level chest discomfort but then had an episode of worsening chest pain while at rest on the evening of January 22.  It radiated to his left arm.  He took nitroglycerin and symptoms resolved within 10-15 minutes.  On the morning of January 23, he had a similar episode shortly after awakening and he again took nitroglycerin with relief.  He became concerned and presented back to the emergency department.  There, he was again hypertensive.  ECG nonacute.  Troponin was normal.  He was admitted for further evaluation.  Troponins have remained normal.  He has not any recurrence of the worsened chest tightness and has continued to have his typical 3-4 out of 10 chest pain.  He is scheduled for stress test this morning.  Inpatient Medications    . amLODipine  5 mg Oral Daily  . aspirin EC  81 mg Oral Daily  . clopidogrel  75 mg Oral Daily  . enoxaparin (LOVENOX) injection  40 mg Subcutaneous Q24H  . ezetimibe  10 mg Oral Daily  . folic acid  1 mg Oral Daily  . lisinopril  10 mg Oral Daily  . metoprolol succinate  150 mg Oral Daily  . multivitamin with minerals  1 tablet Oral Daily  . nitroGLYCERIN  1 inch Topical Q6H  . pantoprazole  40 mg Oral Daily  . sucralfate  1 g Oral QID  . thiamine  100 mg Oral Daily   Or  . thiamine  100 mg Intravenous Daily    Family History    Family History  Problem Relation Age of Onset  . Other Mother        - "back problems and breathing problems."  . Other Father        Died @ age 92 - pt doesn't know father's medical history.  . Microcephaly Sister   . Heart attack Sister    indicated that his mother is alive. He indicated that his father is deceased. He indicated that his sister is  alive.   Social History    Social History   Socioeconomic History  . Marital status: Married    Spouse name: Not on file  . Number of children: Not on file  . Years of education: Not on file  . Highest education level: Not on file  Social Needs  . Financial resource strain: Not on file  . Food insecurity - worry: Not on file  . Food insecurity - inability: Not on file  . Transportation needs -  medical: Not on file  . Transportation needs - non-medical: Not on file  Occupational History  . Occupation: "Maintenance work"  Tobacco Use  . Smoking status: Former Smoker    Types: Cigarettes    Last attempt to quit: 12/13/2001    Years since quitting: 16.0  . Smokeless tobacco: Never Used  Substance and Sexual Activity  . Alcohol use: No    Frequency: Never  . Drug use: No  . Sexual activity: Not on file  Other Topics Concern  . Not on file  Social History Narrative   Lives in [place] with wife.  Works in Maintenance.     Review of Systems    General:  No chills, fever, night sweats or weight changes.  Cardiovascular:  +++ constant chest pain with intermittent worsening, no dyspnea on exertion, edema, orthopnea, palpitations, paroxysmal nocturnal dyspnea. Dermatological: No rash, lesions/masses Respiratory: No cough, dyspnea Urologic: No hematuria, dysuria Abdominal:   No nausea, vomiting, diarrhea, bright red blood per rectum, melena, or hematemesis. +++ GERD Ss Neurologic:  No visual changes, wkns, changes in mental status. All other systems reviewed and are otherwise negative except as noted above.  Physical Exam    Blood pressure 102/60, pulse 71, temperature 98.1 F (36.7 C), temperature source Oral, resp. rate 18, height 5\' 8"  (1.727 m), weight 223 lb 4.8 oz (101.3 kg), SpO2 96 %.  General: Pleasant, NAD Psych: Normal affect. Neuro: Alert and oriented X 3. Moves all extremities spontaneously. HEENT: Normal  Neck: Supple without bruits or JVD. Lungs:  Resp  regular and unlabored, CTA. Heart: RRR no s3, s4, or murmurs. Abdomen: Soft, non-tender, non-distended, BS + x 4.  Extremities: No clubbing, cyanosis or edema. DP/PT/Radials 2+ and equal bilaterally.  Labs     Recent Labs    12/12/17 1226 12/12/17 1607 12/12/17 1838 12/12/17 2135  TROPONINI <0.03 <0.03 <0.03 <0.03   Lab Results  Component Value Date   WBC 8.3 12/12/2017   HGB 16.6 12/12/2017   HCT 48.0 12/12/2017   MCV 89.7 12/12/2017   PLT 251 12/12/2017    Recent Labs  Lab 12/12/17 1226  NA 138  K 4.4  CL 102  CO2 29  BUN 20  CREATININE 1.16  CALCIUM 9.7  GLUCOSE 130*   Lab Results  Component Value Date   CHOL 186 12/13/2017   HDL 39 (L) 12/13/2017   LDLCALC 125 (H) 12/13/2017   TRIG 109 12/13/2017     Radiology Studies    Dg Chest 2 View  Result Date: 12/10/2017 CLINICAL DATA:  Increased central chest pain for 1 week EXAM: CHEST  2 VIEW COMPARISON:  August 06, 2011 FINDINGS: The heart size and mediastinal contours are stable. There is no focal infiltrate, pulmonary edema, or pleural effusion. The visualized skeletal structures are unremarkable. IMPRESSION: No active cardiopulmonary disease. Electronically Signed   By: Abelardo Diesel M.D.   On: 12/10/2017 15:33    ECG & Cardiac Imaging    Regular sinus rhythm, 78, left axis, inferior infarct, T wave inversion in V1 and V2.  Assessment & Plan    1.  Unstable angina/coronary artery disease: Patient has a prior history of CAD status post CABG in 2004 with subsequent occlusions of the vein graft to the left circumflex and RIMA to the RCA.  He has stents to the second obtuse marginal and native RCA, both of which were patent at the time of his catheterization in 2013.  The LIMA to the LAD and vein graft to  the diagonal were also patent at that time.  He has chronic/constant low level chest pain dating back to the time of his surgery and typically has worsening of discomfort with exertion once or so week or so.   But over the past week to week and a half, he has noticed more frequent worsening of chest pain.  He is also had higher blood pressures.  He had 2 episodes of chest pain at rest prompting him to re-present to the emergency department on January 23.  ECG nonacute.  Troponins have been normal.  He is scheduled for stress testing this morning.  Would have a low threshold to pursue diagnostic catheterization.  Continue aspirin, beta-blocker, Zetia, and Plavix.  He is not on a statin secondary to prior intolerance.  2.  Essential hypertension: Blood pressure was high on arrival.  He had previously been on beta-blocker therapy only.  Amlodipine and lisinopril added and blood pressures improved.  Continue to follow.  3.  Hyperlipidemia: LDL 125 on Zetia.  With his history, we should rechallenge him with low-dose rosuvastatin.  If he is unable to tolerate that, we can arrange for [id number] therapy as an outpt.  4.  GERD: Continue PPI.  Signed, Murray Hodgkins, NP 12/13/2017, 9:50 AM  For questions or updates, please contact   Please consult [email or web address] for contact info under Cardiology/STEMI.

## 2017-12-13 NOTE — H&P (View-Only) (Signed)
Cardiology Consult    Patient ID: [name] MRN: [id number], DOB/AGE: [birth year]   Admit date: 12/12/2017 Date of Consult: 12/13/2017  Primary Physician: No primary care provider on file. Primary Cardiologist: [name], MD-new Requesting Provider: Ashok Norris, MD  Patient Profile    Chase Scott is a 62 y.o. male with a history of CAD s/p prior CABG with chronic chest pain ever since, diastolic dysfxn, HTN, HL, and GERD, who is being seen today for the evaluation of worsening chest pain at the request of Dr. Jerelyn Charles.  Past Medical History   Past Medical History:  Diagnosis Date  . CAD (coronary artery disease)    a. 01/2003 s/p PCI to RCA;  b. 02/2003 s/ CABG x 4 (LIMA->LAD, RIMA->RCA, VG->Diag, VG->LCX; c. 2005 s/p PCI to OM2; d. 06/2012 Cath: LM nl, LAD 137m, LCX mild plaque, OM1 min irregs, OM2 patent stent, RCA 56m ISR, VG->LCX 100, VG->Diag ok, RIMA->RCA 100, LIMA->LAD ok, EF 50-55%-->Med Rx.  . Chronic Chest pain   . Diastolic dysfunction    a. 11/2017 Echo: EF 50-55%, no rwma, Gr1 DD, mildly dil LA.  Marland Kitchen Dyslipidemia   . Gastroesophageal reflux disease   . Gastrointestinal symptoms    Chronic gastrointestinal symptoms  . History of left shoulder fracture   . Hyperlipidemia   . Hypertensive cardiovascular disease    a. Labile BPs.    Past Surgical History:  Procedure Laterality Date  . Arthroscopic Knee Surgery Right 2009  . Arthroscopic Knee Surgery Left 10/2017  . BACK SURGERY     Lower  . CORONARY ARTERY BYPASS GRAFT  April 2004   x four  . LEFT HEART CATHETERIZATION WITH CORONARY/GRAFT ANGIOGRAM  07/02/2012   Procedure: LEFT HEART CATHETERIZATION WITH Beatrix Fetters;  Surgeon: Burnell Blanks, MD;  Location: [facility] CATH LAB;  Service: Cardiovascular;;     Allergies  Allergies  Allergen Reactions  . Penicillins Anaphylaxis  . Sulfa Antibiotics Anaphylaxis    History of Present Illness    62 y/o ? with the above complex PMH including  CAD, HTN, HL, diast dysfxn, and GERD.  Cardiac hx dates back to ealry 2004 when he underwent stenting of the RCA.  He says that he had c/p afterwards and three weeks later he suffered a syncopal spell.  He ended up @ [facility] and subsequent cath revealed severe native multi-vessel dzs.  He underwent CABG x 4 @ that time (details of that admission are unavailable).  Following CABG, he noted daily, low-level chest pain. He was evaluated @ [facility] in 2005 and presumably found to have occlusive dzs of the VG  LCX, as he underwent PCI of the OM2 @ that time.  His last cath was performed in 06/2012 @ [facility] and revealed an occluded RIMA  RCA and VG  LCX,, occluded native LAD with patent LIMA  LAD, and patent stents in the OM2 and RCA.  EF was nl and he has been medically managed since.  He was previously followed by Vaughan Browner, MD in our [place] office, but has been lost to f/u since 2013.  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 About once a week or so, his chest pain will worsen with activity, becoming more tight and sometimes assoc w/ dyspnea.  This will typically last between 5 and 15 minutes and resolve with rest.  It has been this way for approximately 14-15 years.  In December 2018, he underwent left knee arthroscopic surgery.  He says he recovered pretty well.  He did come off of Plavix for 5 days prior to surgery but has been on it since.  He has been steadily increasing his activity at about 7-10 days ago, he began to note more frequent episodes of worsening chest tightness and burning with ambulation.  What was occurring about once a week began to occur just about every day.  Prior to returning to work, he was due for a physical by primary care and he was seen on January 21.  He was hypertensive and reported episodes of chest discomfort and was sent to the  emergency department.  There, ECG was nonacute and troponins were normal.  He was advised to be admitted but instead, he requested discharge and arranged for follow-up with Dr. Fletcher Anon on February 5.  Following ER visit on the 21st, he continued to have constant, low-level chest discomfort but then had an episode of worsening chest pain while at rest on the evening of January 22.  It radiated to his left arm.  He took nitroglycerin and symptoms resolved within 10-15 minutes.  On the morning of January 23, he had a similar episode shortly after awakening and he again took nitroglycerin with relief.  He became concerned and presented back to the emergency department.  There, he was again hypertensive.  ECG nonacute.  Troponin was normal.  He was admitted for further evaluation.  Troponins have remained normal.  He has not any recurrence of the worsened chest tightness and has continued to have his typical 3-4 out of 10 chest pain.  He is scheduled for stress test this morning.  Inpatient Medications    . amLODipine  5 mg Oral Daily  . aspirin EC  81 mg Oral Daily  . clopidogrel  75 mg Oral Daily  . enoxaparin (LOVENOX) injection  40 mg Subcutaneous Q24H  . ezetimibe  10 mg Oral Daily  . folic acid  1 mg Oral Daily  . lisinopril  10 mg Oral Daily  . metoprolol succinate  150 mg Oral Daily  . multivitamin with minerals  1 tablet Oral Daily  . nitroGLYCERIN  1 inch Topical Q6H  . pantoprazole  40 mg Oral Daily  . sucralfate  1 g Oral QID  . thiamine  100 mg Oral Daily   Or  . thiamine  100 mg Intravenous Daily    Family History    Family History  Problem Relation Age of Onset  . Other Mother        - "back problems and breathing problems."  . Other Father        Died @ age 13 - pt doesn't know father's medical history.  . Microcephaly Sister   . Heart attack Sister    indicated that his mother is alive. He indicated that his father is deceased. He indicated that his sister is  alive.   Social History    Social History   Socioeconomic History  . Marital status: Married    Spouse name: Not on file  . Number of children: Not on file  . Years of education: Not on file  . Highest education level: Not on file  Social Needs  . Financial resource strain: Not on file  . Food insecurity - worry: Not on file  . Food insecurity - inability: Not on file  . Transportation needs -  medical: Not on file  . Transportation needs - non-medical: Not on file  Occupational History  . Occupation: "Maintenance work"  Tobacco Use  . Smoking status: Former Smoker    Types: Cigarettes    Last attempt to quit: 12/13/2001    Years since quitting: 16.0  . Smokeless tobacco: Never Used  Substance and Sexual Activity  . Alcohol use: No    Frequency: Never  . Drug use: No  . Sexual activity: Not on file  Other Topics Concern  . Not on file  Social History Narrative   Lives in [place] with wife.  Works in Maintenance.     Review of Systems    General:  No chills, fever, night sweats or weight changes.  Cardiovascular:  +++ constant chest pain with intermittent worsening, no dyspnea on exertion, edema, orthopnea, palpitations, paroxysmal nocturnal dyspnea. Dermatological: No rash, lesions/masses Respiratory: No cough, dyspnea Urologic: No hematuria, dysuria Abdominal:   No nausea, vomiting, diarrhea, bright red blood per rectum, melena, or hematemesis. +++ GERD Ss Neurologic:  No visual changes, wkns, changes in mental status. All other systems reviewed and are otherwise negative except as noted above.  Physical Exam    Blood pressure 102/60, pulse 71, temperature 98.1 F (36.7 C), temperature source Oral, resp. rate 18, height 5\' 8"  (1.727 m), weight 223 lb 4.8 oz (101.3 kg), SpO2 96 %.  General: Pleasant, NAD Psych: Normal affect. Neuro: Alert and oriented X 3. Moves all extremities spontaneously. HEENT: Normal  Neck: Supple without bruits or JVD. Lungs:  Resp  regular and unlabored, CTA. Heart: RRR no s3, s4, or murmurs. Abdomen: Soft, non-tender, non-distended, BS + x 4.  Extremities: No clubbing, cyanosis or edema. DP/PT/Radials 2+ and equal bilaterally.  Labs     Recent Labs    12/12/17 1226 12/12/17 1607 12/12/17 1838 12/12/17 2135  TROPONINI <0.03 <0.03 <0.03 <0.03   Lab Results  Component Value Date   WBC 8.3 12/12/2017   HGB 16.6 12/12/2017   HCT 48.0 12/12/2017   MCV 89.7 12/12/2017   PLT 251 12/12/2017    Recent Labs  Lab 12/12/17 1226  NA 138  K 4.4  CL 102  CO2 29  BUN 20  CREATININE 1.16  CALCIUM 9.7  GLUCOSE 130*   Lab Results  Component Value Date   CHOL 186 12/13/2017   HDL 39 (L) 12/13/2017   LDLCALC 125 (H) 12/13/2017   TRIG 109 12/13/2017     Radiology Studies    Dg Chest 2 View  Result Date: 12/10/2017 CLINICAL DATA:  Increased central chest pain for 1 week EXAM: CHEST  2 VIEW COMPARISON:  August 06, 2011 FINDINGS: The heart size and mediastinal contours are stable. There is no focal infiltrate, pulmonary edema, or pleural effusion. The visualized skeletal structures are unremarkable. IMPRESSION: No active cardiopulmonary disease. Electronically Signed   By: Abelardo Diesel M.D.   On: 12/10/2017 15:33    ECG & Cardiac Imaging    Regular sinus rhythm, 78, left axis, inferior infarct, T wave inversion in V1 and V2.  Assessment & Plan    1.  Unstable angina/coronary artery disease: Patient has a prior history of CAD status post CABG in 2004 with subsequent occlusions of the vein graft to the left circumflex and RIMA to the RCA.  He has stents to the second obtuse marginal and native RCA, both of which were patent at the time of his catheterization in 2013.  The LIMA to the LAD and vein graft to  the diagonal were also patent at that time.  He has chronic/constant low level chest pain dating back to the time of his surgery and typically has worsening of discomfort with exertion once or so week or so.   But over the past week to week and a half, he has noticed more frequent worsening of chest pain.  He is also had higher blood pressures.  He had 2 episodes of chest pain at rest prompting him to re-present to the emergency department on January 23.  ECG nonacute.  Troponins have been normal.  He is scheduled for stress testing this morning.  Would have a low threshold to pursue diagnostic catheterization.  Continue aspirin, beta-blocker, Zetia, and Plavix.  He is not on a statin secondary to prior intolerance.  2.  Essential hypertension: Blood pressure was high on arrival.  He had previously been on beta-blocker therapy only.  Amlodipine and lisinopril added and blood pressures improved.  Continue to follow.  3.  Hyperlipidemia: LDL 125 on Zetia.  With his history, we should rechallenge him with low-dose rosuvastatin.  If he is unable to tolerate that, we can arrange for PCSK9i therapy as an outpt.  4.  GERD: Continue PPI.  Signed, Murray Hodgkins, NP 12/13/2017, 9:50 AM  For questions or updates, please contact   Please consult [email or web address] for contact info under Cardiology/STEMI.

## 2017-12-13 NOTE — Progress Notes (Signed)
[facility] NAME: Nataniel Gasper    MR#:  [id number]  DATE OF BIRTH:  [birth year]  SUBJECTIVE:  CHIEF COMPLAINT:   Chief Complaint  Patient presents with  . Chest Pain  No complaints, no chest pain/shortness of breath, in discussion with cardiology-given abnormal stress test will proceed with heart catheterization in the morning  REVIEW OF SYSTEMS:  CONSTITUTIONAL: No fever, fatigue or weakness.  EYES: No blurred or double vision.  EARS, NOSE, AND THROAT: No tinnitus or ear pain.  RESPIRATORY: No cough, shortness of breath, wheezing or hemoptysis.  CARDIOVASCULAR: No chest pain, orthopnea, edema.  GASTROINTESTINAL: No nausea, vomiting, diarrhea or abdominal pain.  GENITOURINARY: No dysuria, hematuria.  ENDOCRINE: No polyuria, nocturia,  HEMATOLOGY: No anemia, easy bruising or bleeding SKIN: No rash or lesion. MUSCULOSKELETAL: No joint pain or arthritis.   NEUROLOGIC: No tingling, numbness, weakness.  PSYCHIATRY: No anxiety or depression.   ROS  DRUG ALLERGIES:   Allergies  Allergen Reactions  . Penicillins Anaphylaxis  . Sulfa Antibiotics Anaphylaxis    VITALS:  Blood pressure (!) 142/78, pulse 95, temperature 98.1 F (36.7 C), temperature source Oral, resp. rate 18, height 5\' 8"  (1.727 m), weight 101.3 kg (223 lb 4.8 oz), SpO2 95 %.  PHYSICAL EXAMINATION:  GENERAL:  62 y.o.-year-old patient lying in the bed with no acute distress.  EYES: Pupils equal, round, reactive to light and accommodation. No scleral icterus. Extraocular muscles intact.  HEENT: Head atraumatic, normocephalic. Oropharynx and nasopharynx clear.  NECK:  Supple, no jugular venous distention. No thyroid enlargement, no tenderness.  LUNGS: Normal breath sounds bilaterally, no wheezing, rales,rhonchi or crepitation. No use of accessory muscles of respiration.  CARDIOVASCULAR: S1, S2 normal. No murmurs, rubs, or gallops.  ABDOMEN: Soft, nontender,  nondistended. Bowel sounds present. No organomegaly or mass.  EXTREMITIES: No pedal edema, cyanosis, or clubbing.  NEUROLOGIC: Cranial nerves II through XII are intact. Muscle strength 5/5 in all extremities. Sensation intact. Gait not checked.  PSYCHIATRIC: The patient is alert and oriented x 3.  SKIN: No obvious rash, lesion, or ulcer.   Physical Exam LABORATORY PANEL:   CBC Recent Labs  Lab 12/12/17 1226  WBC 8.3  HGB 16.6  HCT 48.0  PLT 251   ------------------------------------------------------------------------------------------------------------------  Chemistries  Recent Labs  Lab 12/12/17 1226  NA 138  K 4.4  CL 102  CO2 29  GLUCOSE 130*  BUN 20  CREATININE 1.16  CALCIUM 9.7   ------------------------------------------------------------------------------------------------------------------  Cardiac Enzymes Recent Labs  Lab 12/12/17 1838 12/12/17 2135  TROPONINI <0.03 <0.03   ------------------------------------------------------------------------------------------------------------------  RADIOLOGY:  Nm Myocar Multi W/spect W/wall Motion / Ef  Result Date: 12/13/2017  Defect 1: There is a medium defect of severe severity present in the basal anterolateral and mid anterolateral location.  Findings consistent with prior myocardial infarction.  This is an intermediate risk study.  Nuclear stress EF: 43%.     ASSESSMENT AND PLAN:  1 acute on chronic chest pain/angina Stable on current regiment Status post CABG in 2004, coronary artery stenting x3, last stress test 11/1998 13/14 which was a normal study per patient, alleviated with nitroglycerin, radiating into left arm, associated with nausea, shortness of breath Continue ACS protocol, cardiology input appreciated, cardiac enzymes negative for acute coronary syndrome, echocardiogram noted for diastolic dysfunction stage I/ejection fraction 50-55%, abnormal stress test noted this morning and discussion  with cardiology-plans for heart catheterization in the morning, continue DAPT with aspirin/Plavix, intolerant to statin therapy, Toprol-XL,  lisinopril, supplemental oxygen as needed, scheduled nitroglycerin 1 inch to chest every 6 hours, IV morphine for breakthrough pain, and continue close medical monitoring  2 chronic benign essential hypertension Controlled on current regiment  3 chronic GERD without esophagitis Stable PPI daily  4 history of alcoholism Stable Continue alcohol withdrawal protocol   All the records are reviewed and case discussed with Care Management/Social Workerr. Management plans discussed with the patient, family and they are in agreement.  CODE STATUS: full TOTAL TIME TAKING CARE OF THIS PATIENT: 35 minutes.     POSSIBLE D/C IN 1-2 DAYS, DEPENDING ON CLINICAL CONDITION.   Avel Peace Kleber Crean M.D on 12/13/2017   Between 7am to 6pm - Pager - [phone]  After 6pm go to [email or web address] - password [email or web address] [facility]  Office  [phone]  CC: Primary care physician; No primary care provider on file.  Note: This dictation was prepared with Dragon dictation along with smaller phrase technology. Any transcriptional errors that result from this process are unintentional.

## 2017-12-14 ENCOUNTER — Encounter: Payer: Self-pay | Admitting: Cardiovascular Disease

## 2017-12-14 ENCOUNTER — Encounter
Admission: EM | Disposition: A | Discharge status: Home / Self Care | Payer: Self-pay | Source: Home / Self Care | Attending: Emergency Medicine

## 2017-12-14 DIAGNOSIS — K219 Gastro-esophageal reflux disease without esophagitis: Secondary | ICD-10-CM | POA: Diagnosis not present

## 2017-12-14 DIAGNOSIS — I10 Essential (primary) hypertension: Secondary | ICD-10-CM | POA: Diagnosis not present

## 2017-12-14 DIAGNOSIS — R079 Chest pain, unspecified: Secondary | ICD-10-CM | POA: Diagnosis not present

## 2017-12-14 DIAGNOSIS — I209 Angina pectoris, unspecified: Secondary | ICD-10-CM

## 2017-12-14 DIAGNOSIS — F1021 Alcohol dependence, in remission: Secondary | ICD-10-CM | POA: Diagnosis not present

## 2017-12-14 HISTORY — PX: LEFT HEART CATH AND CORONARY ANGIOGRAPHY: CATH118249

## 2017-12-14 SURGERY — LEFT HEART CATH AND CORONARY ANGIOGRAPHY
Anesthesia: Moderate Sedation

## 2017-12-14 MED ORDER — LOSARTAN POTASSIUM 25 MG PO TABS: 25.0000 mg | ORAL_TABLET | Freq: Every day | ORAL | Status: DC

## 2017-12-14 MED ORDER — FENTANYL CITRATE (PF) 100 MCG/2ML IJ SOLN
INTRAMUSCULAR | Status: AC
  Filled 2017-12-14: qty 2

## 2017-12-14 MED ORDER — IOPAMIDOL (ISOVUE-300) INJECTION 61%
INTRAVENOUS | Status: DC | PRN
  Administered 2017-12-14: 150 mL via INTRA_ARTERIAL

## 2017-12-14 MED ORDER — SODIUM CHLORIDE 0.9 % IV SOLN: 250.0000 mL | INTRAVENOUS | Status: DC | PRN

## 2017-12-14 MED ORDER — LOSARTAN POTASSIUM 25 MG PO TABS: 25.0000 mg | ORAL_TABLET | Freq: Every day | ORAL | 0 refills | Status: DC

## 2017-12-14 MED ORDER — SODIUM CHLORIDE 0.9% FLUSH
3.0000 mL | Freq: Two times a day (BID) | INTRAVENOUS | Status: DC
  Administered 2017-12-14: 3 mL via INTRAVENOUS

## 2017-12-14 MED ORDER — NITROGLYCERIN 5 MG/ML IV SOLN
INTRAVENOUS | Status: AC
  Filled 2017-12-14: qty 10

## 2017-12-14 MED ORDER — SODIUM CHLORIDE 0.9% FLUSH: 3.0000 mL | INTRAVENOUS | Status: DC | PRN

## 2017-12-14 MED ORDER — MIDAZOLAM HCL 2 MG/2ML IJ SOLN
INTRAMUSCULAR | Status: AC
  Filled 2017-12-14: qty 2

## 2017-12-14 MED ORDER — ROSUVASTATIN CALCIUM 5 MG PO TABS: 5.0000 mg | ORAL_TABLET | Freq: Every day | ORAL | Status: DC

## 2017-12-14 MED ORDER — HEPARIN (PORCINE) IN NACL 2-0.9 UNIT/ML-% IJ SOLN
INTRAMUSCULAR | Status: AC
  Filled 2017-12-14: qty 500

## 2017-12-14 MED ORDER — MIDAZOLAM HCL 2 MG/2ML IJ SOLN
INTRAMUSCULAR | Status: DC | PRN
  Administered 2017-12-14: 1 mg via INTRAVENOUS

## 2017-12-14 MED ORDER — ISOSORBIDE MONONITRATE ER 30 MG PO TB24: 30.0000 mg | ORAL_TABLET | Freq: Every day | ORAL | 0 refills | Status: DC

## 2017-12-14 MED ORDER — ROSUVASTATIN CALCIUM 5 MG PO TABS: 5.0000 mg | ORAL_TABLET | Freq: Every day | ORAL | 0 refills | Status: DC

## 2017-12-14 MED ORDER — SODIUM CHLORIDE 0.9 % IV SOLN: INTRAVENOUS | Status: AC

## 2017-12-14 MED ORDER — FENTANYL CITRATE (PF) 100 MCG/2ML IJ SOLN
INTRAMUSCULAR | Status: DC | PRN
  Administered 2017-12-14: 50 ug via INTRAVENOUS

## 2017-12-14 MED ORDER — ISOSORBIDE MONONITRATE ER 30 MG PO TB24
30.0000 mg | ORAL_TABLET | Freq: Every day | ORAL | Status: DC
  Administered 2017-12-14: 30 mg via ORAL
  Filled 2017-12-14: qty 1

## 2017-12-14 MED ORDER — NITROGLYCERIN 1 MG/10 ML FOR IR/CATH LAB
INTRA_ARTERIAL | Status: DC | PRN
  Administered 2017-12-14: 100 ug via INTRACORONARY
  Administered 2017-12-14: 200 ug via INTRACORONARY

## 2017-12-14 MED ORDER — ASPIRIN 81 MG PO TBEC: 81.0000 mg | DELAYED_RELEASE_TABLET | Freq: Every day | ORAL | 0 refills | Status: AC

## 2017-12-14 SURGICAL SUPPLY — 14 items
CATH 5FR PIGTAIL DIAGNOSTIC (CATHETERS) ×1 IMPLANT
CATH INFINITI 5 FR 3DRC (CATHETERS) ×1 IMPLANT
CATH INFINITI 5 FR IM (CATHETERS) ×1 IMPLANT
CATH INFINITI 5FR JL4 (CATHETERS) ×1 IMPLANT
CATH INFINITI JR4 5F (CATHETERS) ×1 IMPLANT
DEVICE CLOSURE MYNXGRIP 5F (Vascular Products) ×1 IMPLANT
KIT MANI 3VAL PERCEP (MISCELLANEOUS) ×2 IMPLANT
NDL PERC 18GX7CM (NEEDLE) IMPLANT
NEEDLE PERC 18GX7CM (NEEDLE) ×2 IMPLANT
PACK CARDIAC CATH (CUSTOM PROCEDURE TRAY) ×2 IMPLANT
SHEATH AVANTI 5FR X 11CM (SHEATH) ×1 IMPLANT
WIRE EMERALD 3MM-J .035X260CM (WIRE) ×1 IMPLANT
WIRE GUIDERIGHT .035X150 (WIRE) ×1 IMPLANT
WIRE HITORQ VERSACORE ST 145CM (WIRE) ×1 IMPLANT

## 2017-12-14 NOTE — Interval H&P Note (Signed)
History and Physical Interval Note:  12/14/2017 10:05 AM  Chase Scott  has presented today for surgery, with the diagnosis of unstable angina  The various methods of treatment have been discussed with the patient and family. After consideration of risks, benefits and other options for treatment, the patient has consented to  Procedure(s): LEFT HEART CATH AND CORONARY ANGIOGRAPHY (N/A) as a surgical intervention .  The patient's history has been reviewed, patient examined, no change in status, stable for surgery.  I have reviewed the patient's chart and labs.  Questions were answered to the patient's satisfaction.     [name]

## 2017-12-14 NOTE — Plan of Care (Signed)
Pt for cath this am

## 2017-12-14 NOTE — CV Procedure (Signed)
Cath showed patent LIMA to LAD and SVG to diagonal. Patent RCA and LCX stents. Moderate ostial RCA stenosis with spasm.  Recommend medical therapy. I added Imdur.  Full report to follow.

## 2017-12-14 NOTE — Progress Notes (Signed)
Progress Note  Patient Name: Chase Scott Date of Encounter: 12/14/2017  Primary Cardiologist: [name], MD  Subjective   No chest pain or sob.  For cath this AM.  Inpatient Medications    Scheduled Meds: . amLODipine  5 mg Oral Daily  . aspirin EC  81 mg Oral Daily  . clopidogrel  75 mg Oral Daily  . enoxaparin (LOVENOX) injection  40 mg Subcutaneous Q24H  . ezetimibe  10 mg Oral Daily  . folic acid  1 mg Oral Daily  . losartan  25 mg Oral Daily  . metoprolol succinate  150 mg Oral Daily  . multivitamin with minerals  1 tablet Oral Daily  . nitroGLYCERIN  1 inch Topical Q6H  . pantoprazole  40 mg Oral Daily  . rosuvastatin  5 mg Oral q1800  . sodium chloride flush  3 mL Intravenous Q12H  . sucralfate  1 g Oral QID  . thiamine  100 mg Oral Daily   Or  . thiamine  100 mg Intravenous Daily   Continuous Infusions: . sodium chloride    . sodium chloride     PRN Meds: sodium chloride, acetaminophen, ALPRAZolam, gi cocktail, hydrALAZINE, hydrALAZINE, LORazepam **OR** LORazepam, morphine injection, ondansetron (ZOFRAN) IV, sodium chloride flush   Vital Signs    Vitals:   12/13/17 1107 12/13/17 1708 12/13/17 2008 12/14/17 0403  BP: (!) 142/78 103/60 104/70 115/71  Pulse: 95 72 67 74  Resp: 18 18 18 18   Temp:   (!) 97.4 F (36.3 C) 98 F (36.7 C)  TempSrc:   Oral Oral  SpO2: 95% 97% 96% 97%  Weight:      Height:        Intake/Output Summary (Last 24 hours) at 12/14/2017 0809 Last data filed at 12/14/2017 0403 Gross per 24 hour  Intake 480 ml  Output 1370 ml  Net -890 ml   Filed Weights   12/12/17 1231 12/12/17 1701  Weight: 230 lb (104.3 kg) 223 lb 4.8 oz (101.3 kg)    Physical Exam   GEN: Well nourished, well developed, in no acute distress.  HEENT: Grossly normal.  Neck: Supple, no JVD, carotid bruits, or masses. Cardiac: RRR, no murmurs, rubs, or gallops. No clubbing, cyanosis, edema.  Radials/DP/PT 2+ and equal bilaterally.  Respiratory:   Respirations regular and unlabored, clear to auscultation bilaterally. GI: Soft, nontender, nondistended, BS + x 4. MS: no deformity or atrophy. Skin: warm and dry, no rash. Neuro:  Strength and sensation are intact. Psych: AAOx3.  Normal affect.  Labs    Chemistry Recent Labs  Lab 12/10/17 1511 12/12/17 1226  NA 136 138  K 4.4 4.4  CL 102 102  CO2 26 29  GLUCOSE 98 130*  BUN 20 20  CREATININE 1.04 1.16  CALCIUM 9.2 9.7  GFRNONAA >60 >60  GFRAA >60 >60  ANIONGAP 8 7     Hematology Recent Labs  Lab 12/10/17 1511 12/12/17 1226  WBC 8.7 8.3  RBC 5.35 5.35  HGB 16.4 16.6  HCT 48.3 48.0  MCV 90.2 89.7  MCH 30.7 31.0  MCHC 34.0 34.5  RDW 12.7 12.6  PLT 243 251    Cardiac Enzymes Recent Labs  Lab 12/12/17 1226 12/12/17 1607 12/12/17 1838 12/12/17 2135  TROPONINI <0.03 <0.03 <0.03 <0.03     Radiology    Nm Myocar Multi W/spect W/wall Motion / Ef  Result Date: 12/13/2017  Defect 1: There is a medium defect of severe severity present in the  basal anterolateral and mid anterolateral location.  Findings consistent with prior myocardial infarction.  This is an intermediate risk study.  Nuclear stress EF: 43%.     Telemetry    Rsr, occas pvc's. - Personally Reviewed  Cardiac Studies   2D Echocardiogram 1.23.2019  Study Conclusions   - Left ventricle: The cavity size was normal. There was moderate   concentric hypertrophy. Systolic function was normal. The   estimated ejection fraction was in the range of 50% to 55%.   Images were inadequate for LV wall motion assessment. Doppler   parameters are consistent with abnormal left ventricular   relaxation (grade 1 diastolic dysfunction). - Left atrium: The atrium was mildly dilated. _____________  Wille Glaser 1.24.2019  Defect 1: There is a medium defect of severe severity present in the basal anterolateral and mid anterolateral location. Findings consistent with prior myocardial  infarction. This is an intermediate risk study. Nuclear stress EF: 43%.   Patient Profile      62 y.o. male with a history of CAD s/p prior CABG with chronic chest pain ever since, diastolic dysfxn, HTN, HL, and GERD, who was admitted 1/23 with progressive rest and exertional c/p.  Assessment & Plan    1.  Unstable Angina/CAD:  S/p prior CABG in 2004 w/ subsequent occlusions of the VG  LCX and RIMA  RCA.  Patent stents in the OM2 and RCA, along with patent LIMA  LAD (LAD chronically occluded), and VG  Diag, on cath in 06/2012.  Admitted 1/23 with progressive exertional angina followed by nitrate responsive symptoms @ rest.  Myoview 1/24 showed basal anterolateral and mid anterolateral defect.  Based on progressive Ss and MV, for cath today.  The patient understands that risks include but are not limited to stroke (1 in 1000), death (1 in 18), kidney failure [usually temporary] (1 in 500), bleeding (1 in 200), allergic reaction [possibly serious] (1 in 200), and agrees to proceed.  No chest pain overnight.  Cont asa,  blocker, zetia, and plavix.  He is willing to try low-dose crestor.  Says he previously had myalgias on lipitor 80 and crestor 20.  2.  Essential HTN:  BP elevated on admission.  Now improved with addition of amlodipine and lisinopril to home  blocker therapy.  He has noted a dry, tickling cough since last night.  I suspect this may be attributable to lisinopril.  I will d/c and add losartan 25 daily.  3.  HL:  LDL 125 on Zetia.  As above, prev intolerant to lipitor 80 and crestor 20.  He is willing to try a lower dose of crestor as he says that overall, he tolerated crestor better than lipitor.  Ultimately, I suspect he will need a PCSK9i.  Can look into that as an outpt.  4.  GERD:  Cont PPI.  Signed, Murray Hodgkins, NP  12/14/2017, 8:09 AM    For questions or updates, please contact   Please consult [email or web address] for contact info under Cardiology/STEMI.

## 2017-12-14 NOTE — Care Management Note (Signed)
Case Management Note  Patient Details  Name: Chase Scott MRN: [id number] Date of Birth: December 18, 1955  Subjective/Objective:   Admitted to [facility] with the diagnosis of angina. Lives with wife, Mariann Laster, [phone]). Seen Dr. Harrel Lemon last Monday. Prescriptions are filled per [facility] and [facility] on [address], No home health. No skilled facility. No home oxygen. No medical equipment in the home. Takes care of all basic activities of daily living himself, drives. Works at [employer] in [place]. Plans to retire soon.  No falls. Good appetite. Wife will transport                 Action/Plan:  Scheduled for cardiac cath today. No follow-up need identified at this time.   Expected Discharge Date:                  Expected Discharge Plan:     In-House Referral:   yes  Discharge planning Services     Post Acute Care Choice:    Choice offered to:     DME Arranged:    DME Agency:     HH Arranged:    HH Agency:     Status of Service:     If discussed at [employer] [facility] [employer], dates discussed:    Additional Comments:  Shelbie Ammons, RN MSN CCM Care Management [phone] 12/14/2017, 8:55 AM

## 2017-12-14 NOTE — Progress Notes (Signed)
Went over discharge instructions with the patient and family including medication and follow-up appointment. Discontinue peripheral IV and telemetry monitor. Called volunteer for transport.  

## 2017-12-14 NOTE — Discharge Summary (Signed)
[facility] NAME: Chase Scott    MR#:  [id number]  DATE OF BIRTH:  December 08, 1955  DATE OF ADMISSION:  12/12/2017 ADMITTING PHYSICIAN: Gorden Harms, MD  DATE OF DISCHARGE: No discharge date for patient encounter.  PRIMARY CARE PHYSICIAN: No primary care provider on file.    ADMISSION DIAGNOSIS:  Chest pain, unspecified type [R07.9]  DISCHARGE DIAGNOSIS:  Active Problems:   Angina pectoris ([place])   SECONDARY DIAGNOSIS:   Past Medical History:  Diagnosis Date  . CAD (coronary artery disease)    a. 01/2003 s/p PCI to RCA;  b. 02/2003 s/ CABG x 4 (LIMA->LAD, RIMA->RCA, VG->Diag, VG->LCX; c. 2005 s/p PCI to OM2; d. 06/2012 Cath: LM nl, LAD 122m, LCX mild plaque, OM1 min irregs, OM2 patent stent, RCA 25m ISR, VG->LCX 100, VG->Diag ok, RIMA->RCA 100, LIMA->LAD ok, EF 50-55%-->Med Rx.  . Chronic Chest pain   . Diastolic dysfunction    a. 11/2017 Echo: EF 50-55%, no rwma, Gr1 DD, mildly dil LA.  Marland Kitchen Dyslipidemia   . Gastroesophageal reflux disease   . Gastrointestinal symptoms    Chronic gastrointestinal symptoms  . History of left shoulder fracture   . Hyperlipidemia   . Hypertensive cardiovascular disease    a. Labile BPs.    HOSPITAL COURSE:  1acute on chronic chest pain/angina Resolved on current regiment Status post CABG in 2004, coronary artery stenting x3, last stress test 11/1998 13/14 which was a normal study per patient, alleviated with nitroglycerin, radiating into left arm, associated with nausea, shortness of breath Referred to the observation unit, cardiac enzymes negative for acute coronary syndrome, cardiology to see patient while in house, underwent heart catheterization noted for moderate ostial stenosis with spasm-medical therapy recommended with Imdur, echocardiogram noted for diastolic dysfunction stage I/ejection fraction 50-55%, abnormal stress test noted while in house which led to heart catheterization  being done as stated above, continue DAPTwith aspirin/Plavix, started on Crestor, Toprol-XL,  lisinopril, and patient did well Patient follow-up with cardiology on December 25, 2017 for reevaluation   2chronic benign essential hypertension Controlled on current regiment  3chronic GERD without esophagitis Stable PPI daily  4history of alcoholism Stable Placed on alcohol withdrawal protocol  DISCHARGE CONDITIONS:  On day of discharge patient is afebrile, hemogram stable, tolerating diet, ready for discharge home with appropriate follow-up primary care provider in 2-3 days for reevaluation, follow with cardiology on December 25, 2017 for reevaluation of coronary spasms  CONSULTS OBTAINED:  Treatment Team:  Josue Hector, MD End, Harrell Gave, MD  DRUG ALLERGIES:   Allergies  Allergen Reactions  . Penicillins Anaphylaxis  . Sulfa Antibiotics Anaphylaxis  . Lipitor [Atorvastatin Calcium]     Myalgias @ 80 mg  . Statins     Myalgias on high dose lipitor and moderate dose crestor.    DISCHARGE MEDICATIONS:   Allergies as of 12/14/2017      Reactions   Penicillins Anaphylaxis   Sulfa Antibiotics Anaphylaxis   Lipitor [atorvastatin Calcium]    Myalgias @ 80 mg   Statins    Myalgias on high dose lipitor and moderate dose crestor.      Medication List    STOP taking these medications   nitroGLYCERIN 0.4 MG SL tablet Commonly known as:  NITROSTAT     TAKE these medications   amLODipine 5 MG tablet Commonly known as:  NORVASC Take 1 tablet (5 mg total) by mouth daily.   aspirin 81 MG EC tablet Take  1 tablet (81 mg total) by mouth daily. Start taking on:  12/15/2017   clopidogrel 75 MG tablet Commonly known as:  PLAVIX Take 75 mg by mouth daily.   esomeprazole 20 MG capsule Commonly known as:  NEXIUM Take 20 mg by mouth daily.   ezetimibe 10 MG tablet Commonly known as:  ZETIA Take 10 mg by mouth daily.   isosorbide mononitrate 30 MG 24 hr  tablet Commonly known as:  IMDUR Take 1 tablet (30 mg total) by mouth daily. Start taking on:  12/15/2017   losartan 25 MG tablet Commonly known as:  COZAAR Take 1 tablet (25 mg total) by mouth daily.   rosuvastatin 5 MG tablet Commonly known as:  CRESTOR Take 1 tablet (5 mg total) by mouth daily at 6 PM.   sucralfate 1 g tablet Commonly known as:  CARAFATE Take 1 g by mouth 4 (four) times daily.   TOPROL XL 100 MG 24 hr tablet Generic drug:  metoprolol succinate Take 150 mg by mouth daily.        DISCHARGE INSTRUCTIONS:    If you experience worsening of your admission symptoms, develop shortness of breath, life threatening emergency, suicidal or homicidal thoughts you must seek medical attention immediately by calling 911 or calling your MD immediately  if symptoms less severe.  You Must read complete instructions/literature along with all the possible adverse reactions/side effects for all the Medicines you take and that have been prescribed to you. Take any new Medicines after you have completely understood and accept all the possible adverse reactions/side effects.   Please note  You were cared for by a hospitalist during your hospital stay. If you have any questions about your discharge medications or the care you received while you were in the hospital after you are discharged, you can call the unit and asked to speak with the hospitalist on call if the hospitalist that took care of you is not available. Once you are discharged, your primary care physician will handle any further medical issues. Please note that NO REFILLS for any discharge medications will be authorized once you are discharged, as it is imperative that you return to your primary care physician (or establish a relationship with a primary care physician if you do not have one) for your aftercare needs so that they can reassess your need for medications and monitor your lab values.    Today   CHIEF  COMPLAINT:   Chief Complaint  Patient presents with  . Chest Pain    HISTORY OF PRESENT ILLNESS:  62 y.o. male with a known history per below which also includes coronary artery disease, status post CABG in 2004, coronary artery stenting x3, chronic chest pain since 2004, most recent stress testing in 2013 or 14 was a normal study per patient, presents with acute on chronic worsening chest pain over the last 24 hours worse with exertion and associated with nausea, shortness of breath, nonradiating dull mid chest pain which is constant, radiating to left arm, made better with nitroglycerin, in the emergency room workup was unimpressive, patient evaluated in the emergency room, wife at the bedside, patient continues to complain of mild to moderate persistent chest pain, patient is now been admitted for acute chest pain which is alleviated with nitroglycerin.     VITAL SIGNS:  Blood pressure 125/72, pulse 73, temperature 97.8 F (36.6 C), temperature source Oral, resp. rate 14, height 5\' 8"  (1.727 m), weight 100.7 kg (222 lb), SpO2 97 %.  I/O:    Intake/Output Summary (Last 24 hours) at 12/14/2017 1408 Last data filed at 12/14/2017 1343 Gross per 24 hour  Intake 480 ml  Output 1295 ml  Net -815 ml    PHYSICAL EXAMINATION:  GENERAL:  62 y.o.-year-old patient lying in the bed with no acute distress.  EYES: Pupils equal, round, reactive to light and accommodation. No scleral icterus. Extraocular muscles intact.  HEENT: Head atraumatic, normocephalic. Oropharynx and nasopharynx clear.  NECK:  Supple, no jugular venous distention. No thyroid enlargement, no tenderness.  LUNGS: Normal breath sounds bilaterally, no wheezing, rales,rhonchi or crepitation. No use of accessory muscles of respiration.  CARDIOVASCULAR: S1, S2 normal. No murmurs, rubs, or gallops.  ABDOMEN: Soft, non-tender, non-distended. Bowel sounds present. No organomegaly or mass.  EXTREMITIES: No pedal edema, cyanosis, or  clubbing.  NEUROLOGIC: Cranial nerves II through XII are intact. Muscle strength 5/5 in all extremities. Sensation intact. Gait not checked.  PSYCHIATRIC: The patient is alert and oriented x 3.  SKIN: No obvious rash, lesion, or ulcer.   DATA REVIEW:   CBC Recent Labs  Lab 12/12/17 1226  WBC 8.3  HGB 16.6  HCT 48.0  PLT 251    Chemistries  Recent Labs  Lab 12/12/17 1226  NA 138  K 4.4  CL 102  CO2 29  GLUCOSE 130*  BUN 20  CREATININE 1.16  CALCIUM 9.7    Cardiac Enzymes Recent Labs  Lab 12/12/17 2135  TROPONINI <0.03    Microbiology Results  No results found for this or any previous visit.  RADIOLOGY:  Nm Myocar Multi W/spect W/wall Motion / Ef  Result Date: 12/13/2017  Defect 1: There is a medium defect of severe severity present in the basal anterolateral and mid anterolateral location.  Findings consistent with prior myocardial infarction.  This is an intermediate risk study.  Nuclear stress EF: 43%.     EKG:   Orders placed or performed during the hospital encounter of 12/12/17  . EKG 12-Lead  . EKG 12-Lead  . ED EKG within 10 minutes  . ED EKG within 10 minutes      Management plans discussed with the patient, family and they are in agreement.  CODE STATUS:     Code Status Orders  (From admission, onward)        Start     Ordered   12/12/17 1548  Full code  Continuous     12/12/17 1547    Code Status History    Date Active Date Inactive Code Status Order ID Comments User Context   This patient has a current code status but no historical code status.      TOTAL TIME TAKING CARE OF THIS PATIENT: 45 minutes.    Avel Peace Corrinne Benegas M.D on 12/14/2017 at 2:08 PM  Between 7am to 6pm - Pager - [phone]  After 6pm go to [email or web address] - password [email or web address] [facility]  Office  [phone]  CC: Primary care physician; No primary care provider on file.   Note: This dictation was prepared with Dragon dictation  along with smaller phrase technology. Any transcriptional errors that result from this process are unintentional.

## 2017-12-25 ENCOUNTER — Ambulatory Visit: Payer: BLUE CROSS/BLUE SHIELD | Admitting: Cardiovascular Disease

## 2017-12-25 ENCOUNTER — Encounter: Payer: Self-pay | Admitting: Cardiovascular Disease

## 2017-12-25 VITALS — BP 128/78 | HR 72 | Ht 68.0 in | Wt 230.0 lb

## 2017-12-25 DIAGNOSIS — I25708 Atherosclerosis of coronary artery bypass graft(s), unspecified, with other forms of angina pectoris: Secondary | ICD-10-CM | POA: Diagnosis not present

## 2017-12-25 DIAGNOSIS — I10 Essential (primary) hypertension: Secondary | ICD-10-CM | POA: Diagnosis not present

## 2017-12-25 DIAGNOSIS — E785 Hyperlipidemia, unspecified: Secondary | ICD-10-CM

## 2017-12-25 MED ORDER — ISOSORBIDE MONONITRATE ER 30 MG PO TB24: 15.0000 mg | ORAL_TABLET | Freq: Every day | ORAL | 1 refills | Status: DC

## 2017-12-25 MED ORDER — ROSUVASTATIN CALCIUM 5 MG PO TABS: 5.0000 mg | ORAL_TABLET | Freq: Every day | ORAL | 2 refills | Status: DC

## 2017-12-25 MED ORDER — LOSARTAN POTASSIUM 25 MG PO TABS: 25.0000 mg | ORAL_TABLET | Freq: Every day | ORAL | 2 refills | Status: DC

## 2017-12-25 NOTE — Progress Notes (Signed)
Cardiology Office Note   Date:  12/25/2017   ID:  Chase Scott 1956/11/13, MRN [id number]  PCP:  No primary care provider on file.  Cardiologist:   [name], MD   Chief Complaint  Patient presents with  . OTHER    Chest pain c/o taking 1/2 tablet isosorbide causing headache. Meds reviewed verbally with pt.      History of Present Illness: Chase Scott is a 62 y.o. male who presents for a follow-up visit regarding coronary artery disease.  He has known history of coronary artery disease status post CABG in 2004 with subsequent PCI of left circumflex and right coronary artery.  He also has chronic diastolic heart failure, hypertension and hyperlipidemia. He was hospitalized recently at [facility] with exertional chest tightness.  EKG showed possible old inferior infarct.  He ruled out for myocardial infarction by enzymes.  He underwent a nuclear stress test which showed evidence of prior infarct in the basal anterolateral and mid anterolateral location without significant reversibility.  EF was 43%.  The study was intermediate risk.  Echocardiogram showed an EF of 50% with grade 1 diastolic dysfunction. I proceeded with cardiac catheterization which showed significant underlying three-vessel coronary artery disease with patent stents in the left circumflex and right coronary arteries.  There was patent LIMA to LAD and SVG to diagonal.  SVG to left circumflex was chronically occluded with atretic RIMA to RCA.  There was moderate ostial RCA stenosis with catheter-induced spasm that improved with nitroglycerin.  Left ventricular end-diastolic pressure was normal. He was treated medically.  We added Imdur and amlodipine.  He reports having headache daily for a few days after hospital discharge which resolved after decreasing the dose of isosorbide to half a tablet daily.  He has not had any episodes of chest pain and overall has been feeling extremely well.  No shortness of breath. He is  tolerating rosuvastatin with no significant myalgia.    Past Medical History:  Diagnosis Date  . CAD (coronary artery disease)    a. 01/2003 s/p PCI to RCA;  b. 02/2003 s/ CABG x 4 (LIMA->LAD, RIMA->RCA, VG->Diag, VG->LCX; c. 2005 s/p PCI to OM2; d. 06/2012 Cath: LM nl, LAD 168m, LCX mild plaque, OM1 min irregs, OM2 patent stent, RCA 20m ISR, VG->LCX 100, VG->Diag ok, RIMA->RCA 100, LIMA->LAD ok, EF 50-55%-->Med Rx.  . Chronic Chest pain   . Diastolic dysfunction    a. 11/2017 Echo: EF 50-55%, no rwma, Gr1 DD, mildly dil LA.  Marland Kitchen Dyslipidemia   . Gastroesophageal reflux disease   . Gastrointestinal symptoms    Chronic gastrointestinal symptoms  . History of left shoulder fracture   . Hyperlipidemia   . Hypertension   . Hypertensive cardiovascular disease    a. Labile BPs.    Past Surgical History:  Procedure Laterality Date  . Arthroscopic Knee Surgery Right 2009  . Arthroscopic Knee Surgery Left 10/2017  . BACK SURGERY     Lower  . CARDIAC CATHETERIZATION    . CORONARY ARTERY BYPASS GRAFT  April 2004   x four  . LEFT HEART CATH AND CORONARY ANGIOGRAPHY N/A 12/14/2017   Procedure: LEFT HEART CATH AND CORONARY ANGIOGRAPHY;  Surgeon: Wellington Hampshire, MD;  Location: [facility];  Service: Cardiovascular;  Laterality: N/A;  . LEFT HEART CATHETERIZATION WITH CORONARY/GRAFT ANGIOGRAM  07/02/2012   Procedure: LEFT HEART CATHETERIZATION WITH Beatrix Fetters;  Surgeon: Burnell Blanks, MD;  Location: [facility] CATH LAB;  Service: Cardiovascular;;  Current Outpatient Medications  Medication Sig Dispense Refill  . amLODipine (NORVASC) 5 MG tablet Take 1 tablet (5 mg total) by mouth daily. 30 tablet 1  . aspirin EC 81 MG EC tablet Take 1 tablet (81 mg total) by mouth daily. 180 tablet 0  . clopidogrel (PLAVIX) 75 MG tablet Take 75 mg by mouth daily.      Marland Kitchen esomeprazole (NEXIUM) 20 MG capsule Take 20 mg by mouth daily.    Marland Kitchen ezetimibe (ZETIA) 10 MG tablet Take 10 mg by  mouth daily.      . isosorbide mononitrate (IMDUR) 30 MG 24 hr tablet Take 1 tablet (30 mg total) by mouth daily. (Patient taking differently: Take 15 mg by mouth daily. ) 90 tablet 0  . losartan (COZAAR) 25 MG tablet Take 1 tablet (25 mg total) by mouth daily. 90 tablet 0  . rosuvastatin (CRESTOR) 5 MG tablet Take 1 tablet (5 mg total) by mouth daily at 6 PM. 90 tablet 0  . sucralfate (CARAFATE) 1 g tablet Take 1 g by mouth 4 (four) times daily.    . TOPROL XL 100 MG 24 hr tablet Take 150 mg by mouth daily.     No current facility-administered medications for this visit.     Allergies:   Penicillins; Sulfa antibiotics; Lipitor [atorvastatin calcium]; and Statins    Social History:  The patient  reports that he quit smoking about 16 years ago. His smoking use included cigarettes. He has a 15.00 pack-year smoking history. he has never used smokeless tobacco. He reports that he does not drink alcohol or use drugs.   Family History:  The patient's family history includes Heart attack in his sister; Microcephaly in his sister; Other in his father and mother.    ROS:  Please see the history of present illness.   Otherwise, review of systems are positive for none.   All other systems are reviewed and negative.    PHYSICAL EXAM: VS:  BP 128/78 (BP Location: Right Arm, Patient Position: Sitting, Cuff Size: Normal)   Pulse 72   Ht 5\' 8"  (1.727 m)   Wt 230 lb (104.3 kg)   BMI 34.97 kg/m  , BMI Body mass index is 34.97 kg/m. GEN: Well nourished, well developed, in no acute distress  HEENT: normal  Neck: no JVD, carotid bruits, or masses Cardiac: RRR; no murmurs, rubs, or gallops,no edema  Respiratory:  clear to auscultation bilaterally, normal work of breathing GI: soft, nontender, nondistended, + BS MS: no deformity or atrophy  Skin: warm and dry, no rash Neuro:  Strength and sensation are intact Psych: euthymic mood, full affect Right groin is intact with mild ecchymosis and no  hematoma.  Normal femoral pulse.   EKG:  EKG is ordered today. The ekg ordered today demonstrates normal sinus rhythm with no significant ST or T wave changes.   Recent Labs: 12/12/2017: BUN 20; Creatinine, Ser 1.16; Hemoglobin 16.6; Platelets 251; Potassium 4.4; Sodium 138    Lipid Panel    Component Value Date/Time   CHOL 186 12/13/2017 0523   TRIG 109 12/13/2017 0523   HDL 39 (L) 12/13/2017 0523   CHOLHDL 4.8 12/13/2017 0523   VLDL 22 12/13/2017 0523   LDLCALC 125 (H) 12/13/2017 0523      Wt Readings from Last 3 Encounters:  12/25/17 230 lb (104.3 kg)  12/14/17 222 lb (100.7 kg)  12/10/17 230 lb (104.3 kg)      PAD Screen 12/25/2017  Previous PAD dx? No  Previous surgical procedure? No  Pain with walking? No  Feet/toe relief with dangling? No  Painful, non-healing ulcers? No  Extremities discolored? No      ASSESSMENT AND PLAN:  1.  Coronary artery disease involving bypass graft with other forms of angina: The patient feels significantly better since hospital discharge.  I suspect that his anginal symptoms were partially due to uncontrolled hypertension.  He initially did not tolerate full dose isosorbide but seems to be doing fine with a half a tablet. Recent cardiac catheterization was reassuring although he does have moderate ostial RCA stenosis.  Continue to monitor this and if his symptoms worsen, I will consider proceeding with FFR evaluation. The patient can resume work next week on Monday.  2.  Essential hypertension: His blood pressure was very high on presentation recently which improved with the addition of amlodipine, losartan and isosorbide.  3.  Hyperlipidemia: History of intolerance to statins.  He is tolerating small dose rosuvastatin and has been on Zetia.  I requested lipid and liver profile to be done in 1 month.    Disposition:   FU with me in 3 months  Signed,  [name], MD  12/25/2017 3:30 PM    [place]

## 2017-12-25 NOTE — Patient Instructions (Signed)
Medication Instructions:  Your physician recommends that you continue on your current medications as directed. Please refer to the Current Medication list given to you today.   Labwork: Fasting lipid and liver profile in one month at the [facility] No appointment needed. Nothing to eat or drink after midnight.   Testing/Procedures: none  Follow-Up: Your physician recommends that you schedule a follow-up appointment in: 3 months with Dr. Fletcher Anon.    Any Other Special Instructions Will Be Listed Below (If Applicable).     If you need a refill on your cardiac medications before your next appointment, please call your pharmacy.

## 2018-01-15 DIAGNOSIS — E7800 Pure hypercholesterolemia, unspecified: Secondary | ICD-10-CM | POA: Diagnosis not present

## 2018-01-15 DIAGNOSIS — K219 Gastro-esophageal reflux disease without esophagitis: Secondary | ICD-10-CM | POA: Diagnosis not present

## 2018-01-15 DIAGNOSIS — I2510 Atherosclerotic heart disease of native coronary artery without angina pectoris: Secondary | ICD-10-CM | POA: Diagnosis not present

## 2018-01-15 DIAGNOSIS — I10 Essential (primary) hypertension: Secondary | ICD-10-CM | POA: Diagnosis not present

## 2018-01-31 ENCOUNTER — Telehealth: Payer: Self-pay | Admitting: Cardiovascular Disease

## 2018-01-31 NOTE — Telephone Encounter (Signed)
° °  [facility] Pre-operative Risk Assessment    Request for surgical clearance:  1. What type of surgery is being performed? Colonoscopy    2. When is this surgery scheduled? 02/08/18   3. What type of clearance is required (medical clearance vs. Pharmacy clearance to hold med vs. Both)? Both  4. Are there any medications that need to be held prior to surgery and how long? Not listed    5. Practice name and name of physician performing surgery? [facility]  Dr Vira Agar   6. What is your office phone and fax number? Fax [phone] Phone [phone]  7. Anesthesia type (None, local, MAC, general) ? Not listed

## 2018-02-01 NOTE — Telephone Encounter (Signed)
Dr. Fletcher Anon can you please comment on clearance for colonoscopy and whether plavix can be held for the procedure, how long?  Please route your response back to the preop pool.

## 2018-02-04 NOTE — Telephone Encounter (Signed)
Hold Plavix 5 days before .colonoscopy

## 2018-02-04 NOTE — Telephone Encounter (Signed)
Reviewed note by Dr. Fletcher Anon above. Will route to PRE-OP pool l

## 2018-02-06 NOTE — Telephone Encounter (Signed)
Attempted to contact Chase Scott at [facility] multiple times. Number provided, [phone], rings a fast busy. I have routed Epic note with Dr. Tyrell Antonio recommendations to hold plavix 5 days before colonoscopy to [facility], [phone].

## 2018-02-06 NOTE — Telephone Encounter (Signed)
Nurse with [facility] would like status of clearance, patient procedure is this Friday 3/22

## 2018-02-08 ENCOUNTER — Encounter: Admission: RE | Payer: Self-pay | Source: Ambulatory Visit

## 2018-02-08 ENCOUNTER — Ambulatory Visit
Admission: RE | Admit: 2018-02-08 | Payer: BLUE CROSS/BLUE SHIELD | Source: Ambulatory Visit | Admitting: Unknown Physician Specialty

## 2018-02-08 SURGERY — ESOPHAGOGASTRODUODENOSCOPY (EGD) WITH PROPOFOL
Anesthesia: General

## 2018-03-14 ENCOUNTER — Telehealth: Payer: Self-pay | Admitting: Cardiovascular Disease

## 2018-03-14 NOTE — Telephone Encounter (Signed)
Pt calling stating for about 2 weeks he's been having concerns about HR  He states his BP is doing well but HR is just very low compared to his normal  His normal HR is around 70 +  He states he's just very tired and isn't able to even walk from front door to mail box without being so tired. But then there are days where is he perfectly fine. He is coming to see Dr Fletcher Anon on 03/26/18  But wanted to know if we could give him some advise before hand.  Sunday HR 45 Monday HR normal 70 and up  Tuesday he states he felt bad and just didn't check it Wednesday he wasn't feeling well again  He's still been going to work but would like to know what to do before  Coming in if we can help    Please call back

## 2018-03-14 NOTE — Telephone Encounter (Signed)
S/w patient. He's been having episodes of decreased heart rate some days. He's also experiencing shortness of breath when walking distances he usually tolerates well. He feels very tired. States his BP is usually stable. He also has cold sweats at night and his hands "fall asleep" easily. Patient would like to be seen sooner than 03/26/18. He is agreeable to seeing Dr End tomorrow at 1:20pm. Patient scheduled.

## 2018-03-15 ENCOUNTER — Encounter: Payer: Self-pay | Admitting: Internal Medicine

## 2018-03-15 ENCOUNTER — Ambulatory Visit: Payer: BLUE CROSS/BLUE SHIELD | Admitting: Internal Medicine

## 2018-03-15 ENCOUNTER — Ambulatory Visit (INDEPENDENT_AMBULATORY_CARE_PROVIDER_SITE_OTHER): Payer: BLUE CROSS/BLUE SHIELD

## 2018-03-15 VITALS — BP 132/80 | HR 76 | Ht 69.0 in | Wt 228.5 lb

## 2018-03-15 DIAGNOSIS — R5383 Other fatigue: Secondary | ICD-10-CM | POA: Diagnosis not present

## 2018-03-15 DIAGNOSIS — R0609 Other forms of dyspnea: Secondary | ICD-10-CM | POA: Diagnosis not present

## 2018-03-15 DIAGNOSIS — E785 Hyperlipidemia, unspecified: Secondary | ICD-10-CM | POA: Diagnosis not present

## 2018-03-15 DIAGNOSIS — I493 Ventricular premature depolarization: Secondary | ICD-10-CM | POA: Diagnosis not present

## 2018-03-15 DIAGNOSIS — I10 Essential (primary) hypertension: Secondary | ICD-10-CM

## 2018-03-15 DIAGNOSIS — I25708 Atherosclerosis of coronary artery bypass graft(s), unspecified, with other forms of angina pectoris: Secondary | ICD-10-CM | POA: Diagnosis not present

## 2018-03-15 DIAGNOSIS — E7800 Pure hypercholesterolemia, unspecified: Secondary | ICD-10-CM | POA: Diagnosis not present

## 2018-03-15 MED ORDER — ISOSORBIDE MONONITRATE ER 30 MG PO TB24: 30.0000 mg | ORAL_TABLET | Freq: Every day | ORAL | Status: DC

## 2018-03-15 NOTE — Progress Notes (Signed)
Follow-up Outpatient Visit Date: 03/15/2018  Primary Care Provider: Baxter Hire, MD [place] [address]  Primary Cardiologist: [name], MD  Chief Complaint: Fatigue and slow heart rate  HPI:  Mr. Hargrove is a 62 y.o. year-old male with history of coronary artery disease status post CABG (2004) and subsequent PCI to the LCx and RCA, chronic diastolic heart failure, hypertension, and hyperlipidemia, who presents for evaluation of slow heart rate.  He was last seen by Dr. Fletcher Anon in early February after preceding hospitalization for exertional chest tightness.  Stress test was intermediate risk with evidence of prior basal and mid anterolateral infarct without significant ischemia.  Cardiac catheterization revealed severe native three-vessel disease.  LIMA to LAD and SVG to diagonal branch were patent.  SVG to LCx was chronically occluded.  RIMA to the RCA was atretic.  The decision was made to proceed with medical therapy.  At the time of his follow-up appointment in February, Mr. Lollar had not had any further chest pain and was feeling "extremely well."  Today, Mr. Pinedo reports that he has been getting quite exhausted over the last 2 to 3 weeks just walking to his mailbox.  He notes accompanying shortness of breath.  This seems to correspond to low heart rates, often dipping into the 40s when he checks his blood pressure.  He has not had any lightheadedness or dizziness.  He reports a "dull ache and tightness" in his chest that has been present for at least 15 years and is unchanged from its baseline.  He denies lower extremity edema and orthopnea.  After his hospitalization in January, Mr. Novack did not tolerate isosorbide mononitrate 30 mg daily due to headaches.  He has been taking 15 mg daily without significant headaches but feels like his chest discomfort was better controlled on the higher dose.  He has not been taking amlodipine for the last 1 to 1.5  months.  --------------------------------------------------------------------------------------------------  Past Medical History:  Diagnosis Date  . CAD (coronary artery disease)    a. 01/2003 s/p PCI to RCA;  b. 02/2003 s/ CABG x 4 (LIMA->LAD, RIMA->RCA, VG->Diag, VG->LCX; c. 2005 s/p PCI to OM2; d. 06/2012 Cath: LM nl, LAD 137m, LCX mild plaque, OM1 min irregs, OM2 patent stent, RCA 76m ISR, VG->LCX 100, VG->Diag ok, RIMA->RCA 100, LIMA->LAD ok, EF 50-55%-->Med Rx.  . Chronic Chest pain   . Diastolic dysfunction    a. 11/2017 Echo: EF 50-55%, no rwma, Gr1 DD, mildly dil LA.  Marland Kitchen Dyslipidemia   . Gastroesophageal reflux disease   . Gastrointestinal symptoms    Chronic gastrointestinal symptoms  . History of left shoulder fracture   . Hyperlipidemia   . Hypertension   . Hypertensive cardiovascular disease    a. Labile BPs.   Past Surgical History:  Procedure Laterality Date  . Arthroscopic Knee Surgery Right 2009  . Arthroscopic Knee Surgery Left 10/2017  . BACK SURGERY     Lower  . CARDIAC CATHETERIZATION    . CORONARY ARTERY BYPASS GRAFT  April 2004   x four  . LEFT HEART CATH AND CORONARY ANGIOGRAPHY N/A 12/14/2017   Procedure: LEFT HEART CATH AND CORONARY ANGIOGRAPHY;  Surgeon: Wellington Hampshire, MD;  Location: [facility];  Service: Cardiovascular;  Laterality: N/A;  . LEFT HEART CATHETERIZATION WITH CORONARY/GRAFT ANGIOGRAM  07/02/2012   Procedure: LEFT HEART CATHETERIZATION WITH Beatrix Fetters;  Surgeon: Burnell Blanks, MD;  Location: [facility] CATH LAB;  Service: Cardiovascular;;  Current Meds  Medication Sig  . aspirin EC 81 MG EC tablet Take 1 tablet (81 mg total) by mouth daily.  . clopidogrel (PLAVIX) 75 MG tablet Take 75 mg by mouth daily.    Marland Kitchen esomeprazole (NEXIUM) 20 MG capsule Take 20 mg by mouth daily.  Marland Kitchen ezetimibe (ZETIA) 10 MG tablet Take 10 mg by mouth daily.    . isosorbide mononitrate (IMDUR) 30 MG 24 hr tablet Take 0.5 tablets (15 mg  total) by mouth daily.  Marland Kitchen losartan (COZAAR) 25 MG tablet Take 1 tablet (25 mg total) by mouth daily.  . rosuvastatin (CRESTOR) 5 MG tablet Take 1 tablet (5 mg total) by mouth daily at 6 PM.  . sucralfate (CARAFATE) 1 g tablet Take 1 g by mouth 4 (four) times daily.  . TOPROL XL 100 MG 24 hr tablet Take 150 mg by mouth daily.    Allergies: Penicillins; Sulfa antibiotics; Lipitor [atorvastatin calcium]; and Statins  Social History   Tobacco Use  . Smoking status: Former Smoker    Packs/day: 1.00    Years: 15.00    Pack years: 15.00    Types: Cigarettes    Last attempt to quit: 12/13/2001    Years since quitting: 16.2  . Smokeless tobacco: Never Used  Substance Use Topics  . Alcohol use: No    Frequency: Never  . Drug use: No    Family History  Problem Relation Age of Onset  . Other Mother        - "back problems and breathing problems."  . Other Father        Died @ age 44 - pt doesn't know father's medical history.  . Microcephaly Sister   . Heart attack Sister     Review of Systems: A 12-system review of systems was performed and was negative except as noted in the HPI.  --------------------------------------------------------------------------------------------------  Physical Exam: BP 132/80 (BP Location: Left Arm, Patient Position: Sitting, Cuff Size: Normal)   Pulse 76   Ht 5\' 9"  (1.753 m)   Wt 228 lb 8 oz (103.6 kg)   BMI 33.74 kg/m   General: B's man, seated comfortably in the exam room.  He is accompanied by his wife. HEENT: No conjunctival pallor or scleral icterus. Moist mucous membranes.  OP clear. Neck: Supple without lymphadenopathy, thyromegaly, JVD, or HJR Lungs: Normal work of breathing. Clear to auscultation bilaterally without wheezes or crackles. Heart: Irregularly irregular rhythm without murmurs or rubs.  Nondisplaced PMI. Abd: Bowel sounds present. Soft, NT/ND without hepatosplenomegaly Ext: No lower extremity edema. Radial, PT, and DP pulses  are 2+ bilaterally. Skin: Warm and dry without rash.  EKG:  NSR with frequent PVC's (ventricular trigeminy) and prolonged QTc.  Lab Results  Component Value Date   WBC 8.3 12/12/2017   HGB 16.6 12/12/2017   HCT 48.0 12/12/2017   MCV 89.7 12/12/2017   PLT 251 12/12/2017    Lab Results  Component Value Date   NA 138 12/12/2017   K 4.4 12/12/2017   CL 102 12/12/2017   CO2 29 12/12/2017   BUN 20 12/12/2017   CREATININE 1.16 12/12/2017   GLUCOSE 130 (H) 12/12/2017   ALT 20 07/01/2012    Lab Results  Component Value Date   CHOL 186 12/13/2017   HDL 39 (L) 12/13/2017   LDLCALC 125 (H) 12/13/2017   TRIG 109 12/13/2017   CHOLHDL 4.8 12/13/2017    --------------------------------------------------------------------------------------------------  ASSESSMENT AND PLAN: PVCs (trigeminy) with fatigue and dyspnea on exertion I  wonder if some of Mr. Ben symptoms and bradycardia at home could be due to frequent PVCs.  EKG today demonstrates ventricular trigeminy, which could be interpreted as significant bradycardia by a sphygmomanometer or palpation of the pulse.  Catheterization in January showed 2/4 occluded bypass grafts, though overall disease was amenable to medical therapy.  It is possible that some degree of ischemia is contributing to Mr. Morina PVCs and fatigue/dyspnea.  We have discussed further medical therapy options and have agreed to increase isosorbide mononitrate to 30 mg daily.  If this does not provide relief, addition of ranolazine should also be considered.  I have also recommended a 48-hour Holter monitor to assess PVC burden.  If the burden is quite high, antiarrhythmic therapy would also need to be considered.  I will check a complete metabolic panel, magnesium level, and TSH today as well.  Coronary artery disease with stable angina Overall, chronic chest pain is at its baseline.  Interestingly, Mr. Nishi noted some improvement on isosorbide mononitrate 30 mg  daily, though he did not tolerate this initially due to headaches.  I am hopeful that he will be able to tolerate this higher dose as he has been on 15 mg daily for a few months.  Hyperlipidemia Goal LDL less than 70; LDL was 125 during hospitalization in January.  Mr. Kwong is tolerating high intensity statin therapy well.  We will check a lipid panel and liver function tests today.  Hypertension Blood pressure upper normal to mildly elevated today.  As above, we will increase isosorbide mononitrate, which may help with this.  No other medication changes to be made today.  Follow-up: Return to clinic as previously scheduled with Dr. Fletcher Anon.  Nelva Bush, MD 03/15/2018 1:58 PM

## 2018-03-15 NOTE — Patient Instructions (Signed)
Medication Instructions: - Your physician has recommended you make the following change in your medication:  1) INCREASE imdur (isosorbide) to 30 mg- take 1 tablet by mouth once daily  Labwork: - Your physician recommends that you have lab work today: Lipid/ CMET/ TSH/ Magnesium  Procedures/Testing: - Your physician has recommended that you wear a 48 hour holter monitor. Holter monitors are medical devices that record the heart's electrical activity. Doctors most often use these monitors to diagnose arrhythmias. Arrhythmias are problems with the speed or rhythm of the heartbeat. The monitor is a small, portable device. You can wear one while you do your normal daily activities. This is usually used to diagnose what is causing palpitations/syncope (passing out).  Follow-Up: - Your physician recommends that you follow-up as scheduled with Dr. Fletcher Anon   Any Additional Special Instructions Will Be Listed Below (If Applicable).     If you need a refill on your cardiac medications before your next appointment, please call your pharmacy.

## 2018-03-16 LAB — COMPREHENSIVE METABOLIC PANEL
ALT: 23 IU/L (ref 0–44)
AST: 14 IU/L (ref 0–40)
Albumin/Globulin Ratio: 1.8 (ref 1.2–2.2)
Albumin: 4.4 g/dL (ref 3.6–4.8)
Alkaline Phosphatase: 59 IU/L (ref 39–117)
BUN/Creatinine Ratio: 16 (ref 10–24)
BUN: 16 mg/dL (ref 8–27)
Bilirubin Total: 0.6 mg/dL (ref 0.0–1.2)
CALCIUM: 9.5 mg/dL (ref 8.6–10.2)
CO2: 22 mmol/L (ref 20–29)
CREATININE: 1.02 mg/dL (ref 0.76–1.27)
Chloride: 104 mmol/L (ref 96–106)
GFR calc Af Amer: 91 mL/min/{1.73_m2} (ref >59)
GFR, EST NON AFRICAN AMERICAN: 79 mL/min/{1.73_m2} (ref >59)
GLUCOSE: 94 mg/dL (ref 65–99)
Globulin, Total: 2.4 g/dL (ref 1.5–4.5)
Potassium: 4.7 mmol/L (ref 3.5–5.2)
Sodium: 139 mmol/L (ref 134–144)
Total Protein: 6.8 g/dL (ref 6.0–8.5)

## 2018-03-16 LAB — LIPID PANEL
CHOL/HDL RATIO: 3.4 ratio (ref 0.0–5.0)
Cholesterol, Total: 180 mg/dL (ref 100–199)
HDL: 53 mg/dL (ref >39)
LDL Calculated: 107 mg/dL — ABNORMAL HIGH (ref 0–99)
Triglycerides: 102 mg/dL (ref 0–149)
VLDL Cholesterol Cal: 20 mg/dL (ref 5–40)

## 2018-03-16 LAB — TSH: TSH: 1.46 u[IU]/mL (ref 0.450–4.500)

## 2018-03-16 LAB — MAGNESIUM: Magnesium: 2.2 mg/dL (ref 1.6–2.3)

## 2018-03-17 DIAGNOSIS — I493 Ventricular premature depolarization: Secondary | ICD-10-CM | POA: Diagnosis not present

## 2018-03-25 ENCOUNTER — Ambulatory Visit
Admission: RE | Admit: 2018-03-25 | Discharge: 2018-03-25 | Disposition: A | Discharge status: Home / Self Care | Payer: BLUE CROSS/BLUE SHIELD | Source: Ambulatory Visit | Attending: Internal Medicine | Admitting: Internal Medicine

## 2018-03-25 DIAGNOSIS — R5383 Other fatigue: Secondary | ICD-10-CM | POA: Diagnosis not present

## 2018-03-25 DIAGNOSIS — R0609 Other forms of dyspnea: Secondary | ICD-10-CM | POA: Diagnosis not present

## 2018-03-25 DIAGNOSIS — I472 Ventricular tachycardia: Secondary | ICD-10-CM | POA: Diagnosis not present

## 2018-03-25 DIAGNOSIS — I493 Ventricular premature depolarization: Secondary | ICD-10-CM | POA: Diagnosis not present

## 2018-03-26 ENCOUNTER — Ambulatory Visit: Payer: BLUE CROSS/BLUE SHIELD | Admitting: Cardiovascular Disease

## 2018-03-26 ENCOUNTER — Encounter: Payer: Self-pay | Admitting: Cardiovascular Disease

## 2018-03-26 VITALS — BP 104/78 | HR 72 | Ht 69.0 in | Wt 230.5 lb

## 2018-03-26 DIAGNOSIS — I10 Essential (primary) hypertension: Secondary | ICD-10-CM | POA: Diagnosis not present

## 2018-03-26 DIAGNOSIS — I493 Ventricular premature depolarization: Secondary | ICD-10-CM

## 2018-03-26 DIAGNOSIS — I25708 Atherosclerosis of coronary artery bypass graft(s), unspecified, with other forms of angina pectoris: Secondary | ICD-10-CM

## 2018-03-26 DIAGNOSIS — E785 Hyperlipidemia, unspecified: Secondary | ICD-10-CM | POA: Diagnosis not present

## 2018-03-26 MED ORDER — EZETIMIBE 10 MG PO TABS: 10.0000 mg | ORAL_TABLET | Freq: Every day | ORAL | 3 refills | Status: DC

## 2018-03-26 NOTE — Patient Instructions (Signed)
Medication Instructions: - Your physician recommends that you continue on your current medications as directed. Please refer to the Current Medication list given to you today.  Labwork: - none ordered  Procedures/Testing: - none ordered  Follow-Up: - Your physician recommends that you schedule a follow-up appointment in: 3 months with Dr. Arida.   Any Additional Special Instructions Will Be Listed Below (If Applicable).     If you need a refill on your cardiac medications before your next appointment, please call your pharmacy.   

## 2018-03-26 NOTE — Progress Notes (Signed)
Cardiology Office Note   Date:  03/26/2018   ID:  Chase, Scott Sep 16, 1956, MRN [id number]  PCP:  Baxter Hire, MD  Cardiologist:   [name], MD   Chief Complaint  Patient presents with  . Other    3 month follow up. Patient c/o chest pain and SOB. Patient did wear a 48 hour holter monitor. Meds reviewed verbally with patient.       History of Present Illness: Chase Scott is a 62 y.o. male who presents for a follow-up visit regarding coronary artery disease.  He has known history of coronary artery disease status post CABG in 2004 with subsequent PCI of left circumflex and right coronary artery.  He also has chronic diastolic heart failure, hypertension and hyperlipidemia. He was hospitalized in January with exertional chest tightness.  EKG showed possible old inferior infarct.  He ruled out for myocardial infarction by enzymes.  He underwent a nuclear stress test which showed evidence of prior infarct in the basal anterolateral and mid anterolateral location without significant reversibility.  EF was 43%.  The study was intermediate risk.  Echocardiogram showed an EF of 50% with grade 1 diastolic dysfunction. I proceeded with cardiac catheterization which showed significant underlying three-vessel coronary artery disease with patent stents in the left circumflex and right coronary arteries.  There was patent LIMA to LAD and SVG to diagonal.  SVG to left circumflex was chronically occluded with atretic RIMA to RCA.  There was moderate ostial RCA stenosis with catheter-induced spasm that improved with nitroglycerin.  Left ventricular end-diastolic pressure was normal. He was treated medically.   He initially have headache with improved but is now able to tolerate the medication.  He was seen by Dr. Saunders Revel for excessive fatigue and was found to have frequent PVCs.  He had a 48-hour Holter monitor done which showed 28,000 PVC and 48 hours representing 18% burden.  Average heart  rate was 71 bpm.  The dose of Imdur was increased to 30 mg once daily. Over the last week, he reports significant improvement in symptoms.  No recent chest pain.   Past Medical History:  Diagnosis Date  . CAD (coronary artery disease)    a. 01/2003 s/p PCI to RCA;  b. 02/2003 s/ CABG x 4 (LIMA->LAD, RIMA->RCA, VG->Diag, VG->LCX; c. 2005 s/p PCI to OM2; d. 06/2012 Cath: LM nl, LAD 142m, LCX mild plaque, OM1 min irregs, OM2 patent stent, RCA 71m ISR, VG->LCX 100, VG->Diag ok, RIMA->RCA 100, LIMA->LAD ok, EF 50-55%-->Med Rx.  . Chronic Chest pain   . Diastolic dysfunction    a. 11/2017 Echo: EF 50-55%, no rwma, Gr1 DD, mildly dil LA.  Marland Kitchen Dyslipidemia   . Gastroesophageal reflux disease   . Gastrointestinal symptoms    Chronic gastrointestinal symptoms  . History of left shoulder fracture   . Hyperlipidemia   . Hypertension   . Hypertensive cardiovascular disease    a. Labile BPs.    Past Surgical History:  Procedure Laterality Date  . Arthroscopic Knee Surgery Right 2009  . Arthroscopic Knee Surgery Left 10/2017  . BACK SURGERY     Lower  . CARDIAC CATHETERIZATION    . CORONARY ARTERY BYPASS GRAFT  April 2004   x four  . LEFT HEART CATH AND CORONARY ANGIOGRAPHY N/A 12/14/2017   Procedure: LEFT HEART CATH AND CORONARY ANGIOGRAPHY;  Surgeon: Wellington Hampshire, MD;  Location: [facility];  Service: Cardiovascular;  Laterality: N/A;  . LEFT HEART  CATHETERIZATION WITH CORONARY/GRAFT ANGIOGRAM  07/02/2012   Procedure: LEFT HEART CATHETERIZATION WITH Beatrix Fetters;  Surgeon: Burnell Blanks, MD;  Location: [facility] C[facility];  Service: Cardiovascular;;     Current Outpatient Medications  Medication Sig Dispense Refill  . aspirin EC 81 MG EC tablet Take 1 tablet (81 mg total) by mouth daily. 180 tablet 0  . clopidogrel (PLAVIX) 75 MG tablet Take 75 mg by mouth daily.      Marland Kitchen esomeprazole (NEXIUM) 20 MG capsule Take 20 mg by mouth daily.    Marland Kitchen ezetimibe (ZETIA) 10 MG tablet  Take 10 mg by mouth daily.      . isosorbide mononitrate (IMDUR) 30 MG 24 hr tablet Take 1 tablet (30 mg total) by mouth daily.    Marland Kitchen losartan (COZAAR) 25 MG tablet Take 1 tablet (25 mg total) by mouth daily. 90 tablet 2  . rosuvastatin (CRESTOR) 5 MG tablet Take 1 tablet (5 mg total) by mouth daily at 6 PM. 90 tablet 2  . sucralfate (CARAFATE) 1 g tablet Take 1 g by mouth 4 (four) times daily.    . TOPROL XL 100 MG 24 hr tablet Take 150 mg by mouth daily.     No current facility-administered medications for this visit.     Allergies:   Penicillins; Sulfa antibiotics; Lipitor [atorvastatin calcium]; and Statins    Social History:  The patient  reports that he quit smoking about 16 years ago. His smoking use included cigarettes. He has a 15.00 pack-year smoking history. He has never used smokeless tobacco. He reports that he does not drink alcohol or use drugs.   Family History:  The patient's family history includes Heart attack in his sister; Microcephaly in his sister; Other in his father and mother.    ROS:  Please see the history of present illness.   Otherwise, review of systems are positive for none.   All other systems are reviewed and negative.    PHYSICAL EXAM: VS:  BP 104/78 (BP Location: Left Arm, Patient Position: Sitting, Cuff Size: Normal)   Pulse 72   Ht 5\' 9"  (1.753 m)   Wt 230 lb 8 oz (104.6 kg)   BMI 34.04 kg/m  , BMI Body mass index is 34.04 kg/m. GEN: Well nourished, well developed, in no acute distress  HEENT: normal  Neck: no JVD, carotid bruits, or masses Cardiac: RRR; no murmurs, rubs, or gallops,no edema  Respiratory:  clear to auscultation bilaterally, normal work of breathing GI: soft, nontender, nondistended, + BS MS: no deformity or atrophy  Skin: warm and dry, no rash Neuro:  Strength and sensation are intact Psych: euthymic mood, full affect   EKG:  EKG is ordered today. The ekg ordered today demonstrates normal sinus rhythm with PVCs and left  atrial enlargement.   Recent Labs: 12/12/2017: Hemoglobin 16.6; Platelets 251 03/15/2018: ALT 23; BUN 16; Creatinine, Ser 1.02; Magnesium 2.2; Potassium 4.7; Sodium 139; TSH 1.460    Lipid Panel    Component Value Date/Time   CHOL 180 03/15/2018 1424   TRIG 102 03/15/2018 1424   HDL 53 03/15/2018 1424   CHOLHDL 3.4 03/15/2018 1424   CHOLHDL 4.8 12/13/2017 0523   VLDL 22 12/13/2017 0523   LDLCALC 107 (H) 03/15/2018 1424      Wt Readings from Last 3 Encounters:  03/26/18 230 lb 8 oz (104.6 kg)  03/15/18 228 lb 8 oz (103.6 kg)  12/25/17 230 lb (104.3 kg)      PAD Screen 12/25/2017  Previous PAD dx? No  Previous surgical procedure? No  Pain with walking? No  Feet/toe relief with dangling? No  Painful, non-healing ulcers? No  Extremities discolored? No      ASSESSMENT AND PLAN:  1.  Coronary artery disease involving bypass graft with other forms of angina: He reports improvement in symptoms especially after increasing the dose of Imdur.  Cardiac catheterization in January was reassuring although he does have moderate ostial RCA stenosis.  Continue to monitor this and if his symptoms worsen, I will consider proceeding with FFR evaluation.  2.  Essential hypertension: Blood pressure is controlled on current medications.  3.  Hyperlipidemia: He is tolerating small dose rosuvastatin and Zetia.  He has known history of intolerance to higher doses.  Most recent lipid profile showed an LDL of 107.  We need to get his LDL below 70.  The patient has an appointment with our lipid clinic in [place] to initiate treatment with a PC SK 9 inhibitor.  4.  PVCs: The burden is high but his symptoms seems to have improved over the last week without intervention.  Continue treatment with metoprolol for now.  If he has recurrent symptoms, I plan on setting him to see Dr. Caryl Comes.  We could also consider adding Ranexa or an antiarrhythmic medication.   Disposition:   FU with me in 3  months  Signed,  [name], MD  03/26/2018 4:08 PM    [facility]

## 2018-04-11 ENCOUNTER — Ambulatory Visit (INDEPENDENT_AMBULATORY_CARE_PROVIDER_SITE_OTHER): Payer: BLUE CROSS/BLUE SHIELD | Admitting: Pharmacist

## 2018-04-11 ENCOUNTER — Encounter: Payer: Self-pay | Admitting: Pharmacist

## 2018-04-11 DIAGNOSIS — E7800 Pure hypercholesterolemia, unspecified: Secondary | ICD-10-CM | POA: Diagnosis not present

## 2018-04-11 NOTE — Progress Notes (Signed)
Patient ID: Chase Scott                 DOB: 1956/02/05                    MRN: [id number]     HPI: Chase Scott is a 62 y.o. male patient of Dr. Fletcher Anon that presents today for lipid evaluation.  PMH includes coronary artery disease status post CABG (2004) and subsequent PCI to the LCx and RCA, chronic diastolic heart failure, hypertension, and hyperlipidemia. He is currently taking rosuvastatin 5mg  daily and ezetimibe 10mg  daily.  He presents for discussion of cholesterol. He states that he previously tried atorvastatin and was unable to tolerate due to muscle aches. He states this was changed to rosuvastatin 40mg  daily which was slightly better, but still developed aches and pains. Subsequently this was reduced to 5mg  daily. This dose has been tolerable to him    Risk Factors: CAD s/p CABG, HTN LDL Goal: <70, nonHDL <100  Current Medications: ezetimibe 10mg  daily, rosuvastatin 5mg  daily Intolerances: higher doses of rosuvastatin 40mg , atorvastatin 80mg  daily (muscle aches - )  Diet: He eats out regularly. He eats mostly meat and vegetables. He eats primarily chicken, pork and fish. He eats grilled and fried mostly. Frying in oil. He drinks mostly water or gingerale. He drinks sweet tea occasionally.   Exercise: He has not been exercising recently due to knee pain.   Family History: sister with MI  Social History: former smoker, denies alcohol  Labs: 03/15/18: TC 180, TG 102, HDL 53, LDL 107, nonHDL 127 (rosuvastatin 5mg  daily and ezetimibe 10mg  daily)  Past Medical History:  Diagnosis Date  . CAD (coronary artery disease)    a. 01/2003 s/p PCI to RCA;  b. 02/2003 s/ CABG x 4 (LIMA->LAD, RIMA->RCA, VG->Diag, VG->LCX; c. 2005 s/p PCI to OM2; d. 06/2012 Cath: LM nl, LAD 160m, LCX mild plaque, OM1 min irregs, OM2 patent stent, RCA 10m ISR, VG->LCX 100, VG->Diag ok, RIMA->RCA 100, LIMA->LAD ok, EF 50-55%-->Med Rx.  . Chronic Chest pain   . Diastolic dysfunction    a. 11/2017 Echo: EF  50-55%, no rwma, Gr1 DD, mildly dil LA.  Marland Kitchen Dyslipidemia   . Gastroesophageal reflux disease   . Gastrointestinal symptoms    Chronic gastrointestinal symptoms  . History of left shoulder fracture   . Hyperlipidemia   . Hypertension   . Hypertensive cardiovascular disease    a. Labile BPs.    Current Outpatient Medications on File Prior to Visit  Medication Sig Dispense Refill  . aspirin EC 81 MG EC tablet Take 1 tablet (81 mg total) by mouth daily. 180 tablet 0  . clopidogrel (PLAVIX) 75 MG tablet Take 75 mg by mouth daily.      Marland Kitchen esomeprazole (NEXIUM) 20 MG capsule Take 20 mg by mouth daily.    Marland Kitchen ezetimibe (ZETIA) 10 MG tablet Take 1 tablet (10 mg total) by mouth daily. 90 tablet 3  . isosorbide mononitrate (IMDUR) 30 MG 24 hr tablet Take 1 tablet (30 mg total) by mouth daily.    Marland Kitchen losartan (COZAAR) 25 MG tablet Take 1 tablet (25 mg total) by mouth daily. 90 tablet 2  . rosuvastatin (CRESTOR) 5 MG tablet Take 1 tablet (5 mg total) by mouth daily at 6 PM. 90 tablet 2  . sucralfate (CARAFATE) 1 g tablet Take 1 g by mouth 4 (four) times daily.    . TOPROL XL 100 MG 24 hr tablet Take  150 mg by mouth daily.     No current facility-administered medications on file prior to visit.     Allergies  Allergen Reactions  . Penicillins Anaphylaxis  . Sulfa Antibiotics Anaphylaxis  . Lipitor [Atorvastatin Calcium]     Myalgias @ 80 mg  . Statins     Myalgias on high dose lipitor and moderate dose crestor.    Assessment/Plan: Hyperlipidemia: LDL and nonHDL are not at goal. He has been unable to tolerate higher doses of statin medication. Will continue rosuvastatin 5mg  daily and ezetimibe 10mg  daily. Will pursue [id number] in addition to bring LDL to goal.     Thank you,  Lelan Pons. Patterson Hammersmith, [facility]  04/11/2018 7:36 AM

## 2018-04-11 NOTE — Patient Instructions (Addendum)
CONTINUE all medication as prescribed.   We will send for coverage of Praluent 75mg  injection every 14 days. Once you receive the medication please let us know so that we can schedule labs [phone]).    Cholesterol Cholesterol is a fat. Your body needs a small amount of cholesterol. Cholesterol (plaque) may build up in your blood vessels (arteries). That makes you more likely to have a heart attack or stroke. You cannot feel your cholesterol level. Having a blood test is the only way to find out if your level is high. Keep your test results. Work with your doctor to keep your cholesterol at a good level. What do the results mean?  Total cholesterol is how much cholesterol is in your blood.  LDL is bad cholesterol. This is the type that can build up. Try to have low LDL.  HDL is good cholesterol. It cleans your blood vessels and carries LDL away. Try to have high HDL.  Triglycerides are fat that the body can store or burn for energy. What are good levels of cholesterol?  Total cholesterol below 200.  LDL below 100 is good for people who have health risks. LDL below 70 is good for people who have very high risks.  HDL above 40 is good. It is best to have HDL of 60 or higher.  Triglycerides below 150. How can I lower my cholesterol? Diet Follow your diet program as told by your doctor.  Choose fish, white meat chicken, or [place] that is roasted or baked. Try not to eat red meat, fried foods, sausage, or lunch meats.  Eat lots of fresh fruits and vegetables.  Choose whole grains, beans, pasta, potatoes, and cereals.  Choose olive oil, corn oil, or canola oil. Only use small amounts.  Try not to eat butter, mayonnaise, shortening, or palm kernel oils.  Try not to eat foods with trans fats.  Choose low-fat or nonfat dairy foods. ? Drink skim or nonfat milk. ? Eat low-fat or nonfat yogurt and cheeses. ? Try not to drink whole milk or cream. ? Try not to eat ice cream, egg  yolks, or full-fat cheeses.  Healthy desserts include angel food cake, ginger snaps, animal crackers, hard candy, popsicles, and low-fat or nonfat frozen yogurt. Try not to eat pastries, cakes, pies, and cookies.  Exercise Follow your exercise program as told by your doctor.  Be more active. Try gardening, walking, and taking the stairs.  Ask your doctor about ways that you can be more active.  Medicine  Take over-the-counter and prescription medicines only as told by your doctor. This information is not intended to replace advice given to you by your health care provider. Make sure you discuss any questions you have with your health care provider. Document Released: 02/02/2009 Document Revised: 06/07/2016 Document Reviewed: 05/18/2016 Elsevier Interactive Patient Education  [employer].

## 2018-04-17 ENCOUNTER — Telehealth: Payer: Self-pay | Admitting: Pharmacist

## 2018-04-17 MED ORDER — EVOLOCUMAB 140 MG/ML ~~LOC~~ SOAJ: 140.0000 mg | SUBCUTANEOUS | 5 refills | Status: DC

## 2018-04-17 NOTE — Telephone Encounter (Signed)
Pt insurance prefers [place]. Resent for [place] which is approved.    Pt will be changing insurance the first part of next week. Will send 30 day supply to [facility] to get through until can obtain coverage through new insurance. Pt states understanding and will call once he has medication and has new insurance information.

## 2018-04-22 ENCOUNTER — Telehealth: Payer: Self-pay

## 2018-04-22 NOTE — Telephone Encounter (Signed)
Awaiting Approval for Prior Auth through [employer] (Key: [id number]) - [id number]  Got the note Stating: Your PA has been faxed to the plan as a paper copy. Please contact the plan directly if you haven't received a determination in a typical timeframe. You will be notified of the determination via fax.

## 2018-04-26 ENCOUNTER — Other Ambulatory Visit: Payer: Self-pay | Admitting: Pharmacist

## 2018-04-26 ENCOUNTER — Encounter: Payer: Self-pay | Admitting: *Deleted

## 2018-04-26 MED ORDER — EVOLOCUMAB 140 MG/ML ~~LOC~~ SOAJ: 140.0000 mg | SUBCUTANEOUS | 5 refills | Status: DC

## 2018-04-26 MED ORDER — REPATHA SURECLICK 140 MG/ML ~~LOC~~ SOAJ: 140.0000 mg | SUBCUTANEOUS | 3 refills | Status: DC

## 2018-04-26 NOTE — Telephone Encounter (Signed)
Spoke with [facility] and got ordered. Pt would like to get through [facility] moving forward. Refill sent as requested.

## 2018-04-29 ENCOUNTER — Ambulatory Visit: Payer: BLUE CROSS/BLUE SHIELD | Admitting: Certified Registered Nurse Anesthetist

## 2018-04-29 ENCOUNTER — Ambulatory Visit
Admission: RE | Admit: 2018-04-29 | Discharge: 2018-04-29 | Disposition: A | Discharge status: Home / Self Care | Payer: BLUE CROSS/BLUE SHIELD | Source: Ambulatory Visit | Attending: Unknown Physician Specialty | Admitting: Unknown Physician Specialty

## 2018-04-29 ENCOUNTER — Encounter: Payer: Self-pay | Admitting: *Deleted

## 2018-04-29 ENCOUNTER — Encounter
Admission: RE | Disposition: A | Discharge status: Home / Self Care | Payer: Self-pay | Source: Ambulatory Visit | Attending: Unknown Physician Specialty

## 2018-04-29 DIAGNOSIS — K3189 Other diseases of stomach and duodenum: Secondary | ICD-10-CM | POA: Diagnosis not present

## 2018-04-29 DIAGNOSIS — Z955 Presence of coronary angioplasty implant and graft: Secondary | ICD-10-CM | POA: Diagnosis not present

## 2018-04-29 DIAGNOSIS — D120 Benign neoplasm of cecum: Secondary | ICD-10-CM | POA: Insufficient documentation

## 2018-04-29 DIAGNOSIS — K2950 Unspecified chronic gastritis without bleeding: Secondary | ICD-10-CM | POA: Diagnosis not present

## 2018-04-29 DIAGNOSIS — E785 Hyperlipidemia, unspecified: Secondary | ICD-10-CM | POA: Diagnosis not present

## 2018-04-29 DIAGNOSIS — Z951 Presence of aortocoronary bypass graft: Secondary | ICD-10-CM | POA: Insufficient documentation

## 2018-04-29 DIAGNOSIS — I2510 Atherosclerotic heart disease of native coronary artery without angina pectoris: Secondary | ICD-10-CM | POA: Diagnosis not present

## 2018-04-29 DIAGNOSIS — Z79899 Other long term (current) drug therapy: Secondary | ICD-10-CM | POA: Insufficient documentation

## 2018-04-29 DIAGNOSIS — Z7982 Long term (current) use of aspirin: Secondary | ICD-10-CM | POA: Diagnosis not present

## 2018-04-29 DIAGNOSIS — I252 Old myocardial infarction: Secondary | ICD-10-CM | POA: Insufficient documentation

## 2018-04-29 DIAGNOSIS — K219 Gastro-esophageal reflux disease without esophagitis: Secondary | ICD-10-CM | POA: Diagnosis not present

## 2018-04-29 DIAGNOSIS — D123 Benign neoplasm of transverse colon: Secondary | ICD-10-CM | POA: Diagnosis not present

## 2018-04-29 DIAGNOSIS — I10 Essential (primary) hypertension: Secondary | ICD-10-CM | POA: Diagnosis not present

## 2018-04-29 DIAGNOSIS — K648 Other hemorrhoids: Secondary | ICD-10-CM | POA: Diagnosis not present

## 2018-04-29 DIAGNOSIS — K621 Rectal polyp: Secondary | ICD-10-CM | POA: Insufficient documentation

## 2018-04-29 DIAGNOSIS — Z1211 Encounter for screening for malignant neoplasm of colon: Secondary | ICD-10-CM | POA: Insufficient documentation

## 2018-04-29 DIAGNOSIS — Z87891 Personal history of nicotine dependence: Secondary | ICD-10-CM | POA: Diagnosis not present

## 2018-04-29 DIAGNOSIS — K2970 Gastritis, unspecified, without bleeding: Secondary | ICD-10-CM | POA: Diagnosis not present

## 2018-04-29 DIAGNOSIS — C189 Malignant neoplasm of colon, unspecified: Secondary | ICD-10-CM | POA: Diagnosis not present

## 2018-04-29 HISTORY — DX: Personal history of urinary calculi: Z87.442

## 2018-04-29 HISTORY — DX: Alcohol dependence, uncomplicated: F10.20

## 2018-04-29 HISTORY — PX: COLONOSCOPY WITH PROPOFOL: SHX5780

## 2018-04-29 HISTORY — DX: Unspecified rotator cuff tear or rupture of unspecified shoulder, not specified as traumatic: M75.100

## 2018-04-29 HISTORY — PX: ESOPHAGOGASTRODUODENOSCOPY (EGD) WITH PROPOFOL: SHX5813

## 2018-04-29 HISTORY — DX: Angina pectoris, unspecified: I20.9

## 2018-04-29 SURGERY — ESOPHAGOGASTRODUODENOSCOPY (EGD) WITH PROPOFOL
Anesthesia: General

## 2018-04-29 MED ORDER — LIDOCAINE HCL (CARDIAC) PF 100 MG/5ML IV SOSY
PREFILLED_SYRINGE | INTRAVENOUS | Status: DC | PRN
  Administered 2018-04-29: 100 mg via INTRAVENOUS

## 2018-04-29 MED ORDER — SODIUM CHLORIDE 0.9 % IV SOLN
INTRAVENOUS | Status: DC
  Administered 2018-04-29: 1000 mL via INTRAVENOUS

## 2018-04-29 MED ORDER — PHENYLEPHRINE HCL 10 MG/ML IJ SOLN
INTRAMUSCULAR | Status: DC | PRN
  Administered 2018-04-29 (×2): 100 ug via INTRAVENOUS

## 2018-04-29 MED ORDER — PROPOFOL 10 MG/ML IV BOLUS
INTRAVENOUS | Status: AC
  Filled 2018-04-29: qty 20

## 2018-04-29 MED ORDER — SODIUM CHLORIDE 0.9 % IV SOLN: INTRAVENOUS | Status: DC

## 2018-04-29 MED ORDER — PROPOFOL 10 MG/ML IV BOLUS
INTRAVENOUS | Status: DC | PRN
  Administered 2018-04-29: 10 mg via INTRAVENOUS
  Administered 2018-04-29: 70 mg via INTRAVENOUS
  Administered 2018-04-29: 10 mg via INTRAVENOUS

## 2018-04-29 MED ORDER — PROPOFOL 500 MG/50ML IV EMUL
INTRAVENOUS | Status: DC | PRN
  Administered 2018-04-29: 160 ug/kg/min via INTRAVENOUS

## 2018-04-29 NOTE — Anesthesia Procedure Notes (Signed)
Performed by: Faline Langer, CRNA Pre-anesthesia Checklist: Patient identified, Emergency Drugs available, Suction available, Patient being monitored and Timeout performed Patient Re-evaluated:Patient Re-evaluated prior to induction Oxygen Delivery Method: Nasal cannula Induction Type: IV induction       

## 2018-04-29 NOTE — Anesthesia Postprocedure Evaluation (Signed)
Anesthesia Post Note  Patient: Chase Scott  Procedure(s) Performed: ESOPHAGOGASTRODUODENOSCOPY (EGD) WITH PROPOFOL (N/A ) COLONOSCOPY WITH PROPOFOL (N/A )  Patient location during evaluation: Endoscopy Anesthesia Type: General Level of consciousness: awake and alert Pain management: pain level controlled Vital Signs Assessment: post-procedure vital signs reviewed and stable Respiratory status: spontaneous breathing, nonlabored ventilation, respiratory function stable and patient connected to nasal cannula oxygen Cardiovascular status: blood pressure returned to baseline and stable Postop Assessment: no apparent nausea or vomiting Anesthetic complications: no     Last Vitals:  Vitals:   04/29/18 1339 04/29/18 1510  BP: (!) 140/93 112/69  Pulse: 69   Resp: 18   Temp: (!) 35.6 C (!) 36.1 C  SpO2: 100%     Last Pain:  Vitals:   04/29/18 1510  TempSrc: Tympanic  PainSc:                  Martha Clan

## 2018-04-29 NOTE — H&P (Signed)
Primary Care Physician:  Baxter Hire, MD Primary Gastroenterologist:  Dr. Vira Agar  Pre-Procedure History & Physical: HPI:  Chase Scott is a 62 y.o. male is here for an endoscopy and colonoscopy.  For GERD and colon cancer screening.   Past Medical History:  Diagnosis Date  . Alcoholism ([place])   . Anginal pain ([place])   . CAD (coronary artery disease)    a. 01/2003 s/p PCI to RCA;  b. 02/2003 s/ CABG x 4 (LIMA->LAD, RIMA->RCA, VG->Diag, VG->LCX; c. 2005 s/p PCI to OM2; d. 06/2012 Cath: LM nl, LAD 151m, LCX mild plaque, OM1 min irregs, OM2 patent stent, RCA 55m ISR, VG->LCX 100, VG->Diag ok, RIMA->RCA 100, LIMA->LAD ok, EF 50-55%-->Med Rx.  . Chronic Chest pain   . Diastolic dysfunction    a. 11/2017 Echo: EF 50-55%, no rwma, Gr1 DD, mildly dil LA.  Marland Kitchen Dyslipidemia   . Gastroesophageal reflux disease   . Gastrointestinal symptoms    Chronic gastrointestinal symptoms  . History of kidney stones   . History of left shoulder fracture   . Hyperlipidemia   . Hypertension   . Hypertensive cardiovascular disease    a. Labile BPs.  . Rotator cuff syndrome     Past Surgical History:  Procedure Laterality Date  . Arthroscopic Knee Surgery Right 2009  . Arthroscopic Knee Surgery Left 10/2017  . BACK SURGERY     Lower  . CARDIAC CATHETERIZATION    . CORONARY ARTERY BYPASS GRAFT  April 2004   x four  . LEFT HEART CATH AND CORONARY ANGIOGRAPHY N/A 12/14/2017   Procedure: LEFT HEART CATH AND CORONARY ANGIOGRAPHY;  Surgeon: Wellington Hampshire, MD;  Location: [facility];  Service: Cardiovascular;  Laterality: N/A;  . LEFT HEART CATHETERIZATION WITH CORONARY/GRAFT ANGIOGRAM  07/02/2012   Procedure: LEFT HEART CATHETERIZATION WITH Beatrix Fetters;  Surgeon: Burnell Blanks, MD;  Location: [facility] CATH LAB;  Service: Cardiovascular;;  . TONSILLECTOMY      Prior to Admission medications   Medication Sig Start Date End Date Taking? Authorizing Provider  aspirin EC 81 MG EC  tablet Take 1 tablet (81 mg total) by mouth daily. 12/15/17  Yes Salary, Avel Peace, MD  clopidogrel (PLAVIX) 75 MG tablet Take 75 mg by mouth daily.     Yes [provider]  esomeprazole (NEXIUM) 20 MG capsule Take 20 mg by mouth daily.   Yes [provider]  ezetimibe (ZETIA) 10 MG tablet Take 1 tablet (10 mg total) by mouth daily. 03/26/18  Yes Wellington Hampshire, MD  isosorbide mononitrate (IMDUR) 30 MG 24 hr tablet Take 1 tablet (30 mg total) by mouth daily. 03/15/18  Yes End, Harrell Gave, MD  losartan (COZAAR) 25 MG tablet Take 1 tablet (25 mg total) by mouth daily. 12/25/17  Yes Wellington Hampshire, MD  REPATHA SURECLICK 782 MG/ML SOAJ Inject 140 mg into the skin every 14 (fourteen) days. 04/26/18  Yes Wellington Hampshire, MD  rosuvastatin (CRESTOR) 5 MG tablet Take 1 tablet (5 mg total) by mouth daily at 6 PM. 12/25/17  Yes Arida, Mertie Clause, MD  sucralfate (CARAFATE) 1 g tablet Take 1 g by mouth 4 (four) times daily.   Yes [provider]  TOPROL XL 100 MG 24 hr tablet Take 150 mg by mouth daily. 10/30/17  Yes [provider]    Allergies as of 03/13/2018 - Review Complete 12/25/2017  Allergen Reaction Noted  . Penicillins Anaphylaxis 09/27/2011  . Sulfa antibiotics Anaphylaxis 09/27/2011  .  Lipitor [atorvastatin calcium]  12/14/2017  . Statins  12/14/2017    Family History  Problem Relation Age of Onset  . Other Mother        - "back problems and breathing problems."  . Other Father        Died @ age 51 - pt doesn't know father's medical history.  . Microcephaly Sister   . Heart attack Sister     Social History   Socioeconomic History  . Marital status: Married    Spouse name: Not on file  . Number of children: Not on file  . Years of education: Not on file  . Highest education level: Not on file  Occupational History  . Occupation: "Maintenance workCatering manager  . Financial resource strain: Not on file  . Food insecurity:    Worry: Not on  file    Inability: Not on file  . Transportation needs:    Medical: Not on file    Non-medical: Not on file  Tobacco Use  . Smoking status: Former Smoker    Packs/day: 1.00    Years: 15.00    Pack years: 15.00    Types: Cigarettes    Last attempt to quit: 12/13/2001    Years since quitting: 16.3  . Smokeless tobacco: Never Used  Substance and Sexual Activity  . Alcohol use: No    Frequency: Never  . Drug use: No  . Sexual activity: Not on file  Lifestyle  . Physical activity:    Days per week: Not on file    Minutes per session: Not on file  . Stress: Not on file  Relationships  . Social connections:    Talks on phone: Not on file    Gets together: Not on file    Attends religious service: Not on file    Active member of club or organization: Not on file    Attends meetings of clubs or organizations: Not on file    Relationship status: Not on file  . Intimate partner violence:    Fear of current or ex partner: Not on file    Emotionally abused: Not on file    Physically abused: Not on file    Forced sexual activity: Not on file  Other Topics Concern  . Not on file  Social History Narrative   Lives in [place] with wife.  Works in Maintenance.    Review of Systems: See HPI, otherwise negative ROS  Physical Exam: BP (!) 140/93   Pulse 69   Temp (!) 96 F (35.6 C) (Tympanic)   Resp 18   Ht 5\' 9"  (1.753 m)   Wt 102.1 kg (225 lb)   SpO2 100%   BMI 33.23 kg/m  General:   Alert,  pleasant and cooperative in NAD Head:  Normocephalic and atraumatic. Neck:  Supple; no masses or thyromegaly. Lungs:  Clear throughout to auscultation.    Heart:  Regular rate and rhythm. Abdomen:  Soft, nontender and nondistended. Normal bowel sounds, without guarding, and without rebound.   Neurologic:  Alert and  oriented x4;  grossly normal neurologically.  Impression/Plan: Chase Scott is here for an endoscopy and colonoscopy to be performed for GERD and colon cancer  screening.  Risks, benefits, limitations, and alternatives regarding  endoscopy and colonoscopy have been reviewed with the patient.  Questions have been answered.  All parties agreeable.   Gaylyn Cheers, MD  04/29/2018, 2:28 PM

## 2018-04-29 NOTE — Anesthesia Preprocedure Evaluation (Addendum)
Anesthesia Evaluation  Patient identified by MRN, date of birth, ID band Patient awake    Reviewed: Allergy & Precautions, H&P , NPO status , Patient's Chart, lab work & pertinent test results, reviewed documented beta blocker date and time   History of Anesthesia Complications Negative for: history of anesthetic complications  Airway Mallampati: III  TM Distance: >3 FB Neck ROM: full    Dental  (+) Dental Advidsory Given, Teeth Intact, Caps, Missing   Pulmonary neg pulmonary ROS, former smoker,           Cardiovascular Exercise Tolerance: Good hypertension, + angina (stable) + CAD, + Cardiac Stents and + CABG  (-) Past MI + dysrhythmias (-) Valvular Problems/Murmurs     Neuro/Psych negative neurological ROS  negative psych ROS   GI/Hepatic Neg liver ROS, GERD  ,  Endo/Other  negative endocrine ROS  Renal/GU Renal disease (kidney stones)  negative genitourinary   Musculoskeletal   Abdominal   Peds  Hematology negative hematology ROS (+)   Anesthesia Other Findings Past Medical History: No date: Alcoholism ([place]) No date: Anginal pain ([place]) No date: CAD (coronary artery disease)     Comment:  a. 01/2003 s/p PCI to RCA;  b. 02/2003 s/ CABG x 4               (LIMA->LAD, RIMA->RCA, VG->Diag, VG->LCX; c. 2005 s/p PCI              to OM2; d. 06/2012 Cath: LM nl, LAD 169m, LCX mild plaque,              OM1 min irregs, OM2 patent stent, RCA 60m ISR, VG->LCX               100, VG->Diag ok, RIMA->RCA 100, LIMA->LAD ok, EF               50-55%-->Med Rx. No date: Chronic Chest pain No date: Diastolic dysfunction     Comment:  a. 11/2017 Echo: EF 50-55%, no rwma, Gr1 DD, mildly dil               LA. No date: Dyslipidemia No date: Gastroesophageal reflux disease No date: Gastrointestinal symptoms     Comment:  Chronic gastrointestinal symptoms No date: History of kidney stones No date: History of left shoulder fracture No  date: Hyperlipidemia No date: Hypertension No date: Hypertensive cardiovascular disease     Comment:  a. Labile BPs. No date: Rotator cuff syndrome   Reproductive/Obstetrics negative OB ROS                            Anesthesia Physical Anesthesia Plan  ASA: III  Anesthesia Plan: General   Post-op Pain Management:    Induction: Intravenous  PONV Risk Score and Plan: 2 and Propofol infusion  Airway Management Planned: Nasal Cannula  Additional Equipment:   Intra-op Plan:   Post-operative Plan:   Informed Consent: I have reviewed the patients History and Physical, chart, labs and discussed the procedure including the risks, benefits and alternatives for the proposed anesthesia with the patient or authorized representative who has indicated his/her understanding and acceptance.   Dental Advisory Given  Plan Discussed with: Anesthesiologist, CRNA and Surgeon  Anesthesia Plan Comments:        Anesthesia Quick Evaluation

## 2018-04-29 NOTE — Op Note (Signed)
[facility] Gastroenterology Patient Name: Lawrnce Reyez Procedure Date: 04/29/2018 2:36 PM MRN: [id number] Account #: [id number] Date of Birth: 1956/02/21 Admit Type: Outpatient Age: 62 Room: [facility] ENDO ROOM 3 Gender: Male Note Status: Finalized Procedure:            Upper GI endoscopy Indications:          Heartburn, Suspected gastro-esophageal reflux disease Providers:            Manya Silvas, MD Referring MD:         Baxter Hire, MD (Referring MD) Medicines:            Propofol per Anesthesia Complications:        No immediate complications. Procedure:            Pre-Anesthesia Assessment:                       - After reviewing the risks and benefits, the patient                        was deemed in satisfactory condition to undergo the                        procedure.                       After obtaining informed consent, the endoscope was                        passed under direct vision. Throughout the procedure,                        the patient's blood pressure, pulse, and oxygen                        saturations were monitored continuously. The Endoscope                        was introduced through the mouth, and advanced to the                        second part of duodenum. The upper GI endoscopy was                        accomplished without difficulty. The patient tolerated                        the procedure well. Findings:      The examined esophagus was normal. GEJ 40cm.      Striped mildly erythematous mucosa without bleeding was found in the       gastric antrum. Biopsies were taken with a cold forceps for histology.       Biopsies were taken with a cold forceps for histology. Body looked       normal.      The examined duodenum was normal. Impression:           - Normal esophagus.                       - Erythematous mucosa in the antrum. Biopsied.                       -  Normal examined duodenum. Recommendation:       -  Await pathology results. Manya Silvas, MD 04/29/2018 2:47:09 PM This report has been signed electronically. Number of Addenda: 0 Note Initiated On: 04/29/2018 2:36 PM      [facility]

## 2018-04-29 NOTE — Transfer of Care (Signed)
Immediate Anesthesia Transfer of Care Note  Patient: Chase Scott  Procedure(s) Performed: ESOPHAGOGASTRODUODENOSCOPY (EGD) WITH PROPOFOL (N/A ) COLONOSCOPY WITH PROPOFOL (N/A )  Patient Location: Endoscopy Unit  Anesthesia Type:General  Level of Consciousness: drowsy and patient cooperative  Airway & Oxygen Therapy: Patient Spontanous Breathing and Patient connected to nasal cannula oxygen  Post-op Assessment: Report given to RN and Post -op Vital signs reviewed and stable  Post vital signs: Reviewed and stable  Last Vitals:  Vitals Value Taken Time  BP 112/69 04/29/2018  3:10 PM  Temp 36.1 C 04/29/2018  3:10 PM  Pulse 68 04/29/2018  3:12 PM  Resp 19 04/29/2018  3:12 PM  SpO2 97 % 04/29/2018  3:12 PM  Vitals shown include unvalidated device data.  Last Pain:  Vitals:   04/29/18 1510  TempSrc: Tympanic  PainSc:       Patients Stated Pain Goal: 0 (45/99/77 [id number])  Complications: No apparent anesthesia complications

## 2018-04-29 NOTE — Op Note (Signed)
[facility] Gastroenterology Patient Name: Chase Scott Procedure Date: 04/29/2018 2:35 PM MRN: [id number] Account #: [id number] Date of Birth: December 17, 1955 Admit Type: Outpatient Age: 62 Room: [facility] ENDO ROOM 3 Gender: Male Note Status: Finalized Procedure:            Colonoscopy Indications:          Screening for colorectal malignant neoplasm Providers:            Manya Silvas, MD Referring MD:         Baxter Hire, MD (Referring MD) Medicines:            Propofol per Anesthesia Complications:        No immediate complications. Procedure:            Pre-Anesthesia Assessment:                       - After reviewing the risks and benefits, the patient                        was deemed in satisfactory condition to undergo the                        procedure.                       After obtaining informed consent, the colonoscope was                        passed under direct vision. Throughout the procedure,                        the patient's blood pressure, pulse, and oxygen                        saturations were monitored continuously. The                        Colonoscope was introduced through the anus and                        advanced to the the cecum, identified by appendiceal                        orifice and ileocecal valve. The colonoscopy was                        performed without difficulty. The patient tolerated the                        procedure well. The quality of the bowel preparation                        was excellent. Findings:      A diminutive polyp was found in the cecum. The polyp was sessile. The       polyp was removed with a cold snare. Resection and retrieval were       complete.      A diminutive polyp was found in the transverse colon. The polyp was       sessile. The polyp was removed with a cold snare. Resection and  retrieval were complete.      Two sessile polyps were found in the rectum. The polyps were  diminutive       in size. These polyps were removed with a jumbo cold forceps. Resection       and retrieval were complete.      The exam was otherwise without abnormality. Impression:           - One diminutive polyp in the cecum, removed with a                        cold snare. Resected and retrieved.                       - One diminutive polyp in the transverse colon, removed                        with a cold snare. Resected and retrieved.                       - Two diminutive polyps in the rectum, removed with a                        jumbo cold forceps. Resected and retrieved.                       - The examination was otherwise normal. Recommendation:       - Await pathology results. Manya Silvas, MD 04/29/2018 3:07:35 PM This report has been signed electronically. Number of Addenda: 0 Note Initiated On: 04/29/2018 2:35 PM Scope Withdrawal Time: 0 hours 12 minutes 3 seconds  Total Procedure Duration: 0 hours 13 minutes 55 seconds       [facility]

## 2018-04-29 NOTE — Anesthesia Post-op Follow-up Note (Signed)
Anesthesia QCDR form completed.        

## 2018-04-30 ENCOUNTER — Encounter: Payer: Self-pay | Admitting: Unknown Physician Specialty

## 2018-05-01 LAB — SURGICAL PATHOLOGY

## 2018-06-28 ENCOUNTER — Ambulatory Visit (INDEPENDENT_AMBULATORY_CARE_PROVIDER_SITE_OTHER): Payer: BLUE CROSS/BLUE SHIELD | Admitting: Cardiovascular Disease

## 2018-06-28 ENCOUNTER — Encounter: Payer: Self-pay | Admitting: Cardiovascular Disease

## 2018-06-28 VITALS — BP 130/80 | HR 70 | Ht 69.0 in | Wt 230.5 lb

## 2018-06-28 DIAGNOSIS — I25708 Atherosclerosis of coronary artery bypass graft(s), unspecified, with other forms of angina pectoris: Secondary | ICD-10-CM | POA: Diagnosis not present

## 2018-06-28 DIAGNOSIS — I10 Essential (primary) hypertension: Secondary | ICD-10-CM

## 2018-06-28 DIAGNOSIS — E785 Hyperlipidemia, unspecified: Secondary | ICD-10-CM | POA: Diagnosis not present

## 2018-06-28 DIAGNOSIS — I493 Ventricular premature depolarization: Secondary | ICD-10-CM | POA: Diagnosis not present

## 2018-06-28 MED ORDER — PANTOPRAZOLE SODIUM 40 MG PO TBEC: 40.0000 mg | DELAYED_RELEASE_TABLET | Freq: Every day | ORAL | 3 refills | Status: DC

## 2018-06-28 NOTE — Patient Instructions (Signed)
Medication Instructions: STOP the Nexium START Protonix 40 mg daily  If you need a refill on your cardiac medications before your next appointment, please call your pharmacy.   Follow-Up: Your physician wants you to follow-up in 4 months with Dr. Fletcher Anon.    Thank you for choosing [facility]!

## 2018-06-28 NOTE — Progress Notes (Signed)
Cardiology Office Note   Date:  06/28/2018   ID:  Day, Deery [birth year], MRN [id number]  PCP:  Baxter Hire, MD  Cardiologist:   [name], MD   Chief Complaint  Patient presents with  . Other    3 month follow up. Patient states he has been having reflux. Meds reviewed verbally with patient.       History of Present Illness: Chase Scott is a 62 y.o. male who presents for a follow-up visit regarding coronary artery disease.  He has known history of coronary artery disease status post CABG in 2004 with subsequent PCI of left circumflex and right coronary artery.  He also has chronic diastolic heart failure, hypertension and hyperlipidemia. He was hospitalized in January, 2019 with exertional chest tightness.  EKG showed possible old inferior infarct.  He ruled out for myocardial infarction by enzymes.  He underwent a nuclear stress test which showed evidence of prior infarct in the basal anterolateral and mid anterolateral location without significant reversibility.  EF was 43%.  The study was intermediate risk.  Echocardiogram showed an EF of 50% with grade 1 diastolic dysfunction. I proceeded with cardiac catheterization which showed significant underlying three-vessel coronary artery disease with patent stents in the left circumflex and right coronary arteries.  There was patent LIMA to LAD and SVG to diagonal.  SVG to left circumflex was chronically occluded with atretic RIMA to RCA.  There was moderate ostial RCA stenosis with catheter-induced spasm that improved with nitroglycerin.  Left ventricular end-diastolic pressure was normal. He was treated medically.    He is known to have PVCs.  Holter monitor in April showed 28,000 PVC in 48 hours representing 18% burden.  Average heart rate was 71 bpm.     He reports improved symptoms overall with no recent exertional chest pain.  He denies palpitations.  He does complain of increased heartburn especially after he eats  certain food.  He has been taking Nexium over-the-counter 20 mg twice daily.  He has known history of hyperlipidemia with intolerance to statins.  He was started on Repatha and he has been tolerating the medication without side effects.  Past Medical History:  Diagnosis Date  . Alcoholism ([place])   . Anginal pain ([place])   . CAD (coronary artery disease)    a. 01/2003 s/p PCI to RCA;  b. 02/2003 s/ CABG x 4 (LIMA->LAD, RIMA->RCA, VG->Diag, VG->LCX; c. 2005 s/p PCI to OM2; d. 06/2012 Cath: LM nl, LAD 17m, LCX mild plaque, OM1 min irregs, OM2 patent stent, RCA 57m ISR, VG->LCX 100, VG->Diag ok, RIMA->RCA 100, LIMA->LAD ok, EF 50-55%-->Med Rx.  . Chronic Chest pain   . Diastolic dysfunction    a. 11/2017 Echo: EF 50-55%, no rwma, Gr1 DD, mildly dil LA.  Marland Kitchen Dyslipidemia   . Gastroesophageal reflux disease   . Gastrointestinal symptoms    Chronic gastrointestinal symptoms  . History of kidney stones   . History of left shoulder fracture   . Hyperlipidemia   . Hypertension   . Hypertensive cardiovascular disease    a. Labile BPs.  . Rotator cuff syndrome     Past Surgical History:  Procedure Laterality Date  . Arthroscopic Knee Surgery Right 2009  . Arthroscopic Knee Surgery Left 10/2017  . BACK SURGERY     Lower  . CARDIAC CATHETERIZATION    . COLONOSCOPY WITH PROPOFOL N/A 04/29/2018   Procedure: COLONOSCOPY WITH PROPOFOL;  Surgeon: Manya Silvas, MD;  Location: [facility]  ENDOSCOPY;  Service: Endoscopy;  Laterality: N/A;  . CORONARY ARTERY BYPASS GRAFT  April 2004   x four  . ESOPHAGOGASTRODUODENOSCOPY (EGD) WITH PROPOFOL N/A 04/29/2018   Procedure: ESOPHAGOGASTRODUODENOSCOPY (EGD) WITH PROPOFOL;  Surgeon: Manya Silvas, MD;  Location: [facility] ENDOSCOPY;  Service: Endoscopy;  Laterality: N/A;  . LEFT HEART CATH AND CORONARY ANGIOGRAPHY N/A 12/14/2017   Procedure: LEFT HEART CATH AND CORONARY ANGIOGRAPHY;  Surgeon: Wellington Hampshire, MD;  Location: [place] CV LAB;  Service:  Cardiovascular;  Laterality: N/A;  . LEFT HEART CATHETERIZATION WITH CORONARY/GRAFT ANGIOGRAM  07/02/2012   Procedure: LEFT HEART CATHETERIZATION WITH Beatrix Fetters;  Surgeon: Burnell Blanks, MD;  Location[facility] CATH LAB;  Service: Cardiovascular;;  . TONSILLECTOMY       Current Outpatient Medications  Medication Sig Dispense Refill  . aspirin EC 81 MG EC tablet Take 1 tablet (81 mg total) by mouth daily. 180 tablet 0  . clopidogrel (PLAVIX) 75 MG tablet Take 75 mg by mouth daily.      Marland Kitchen esomeprazole (NEXIUM) 20 MG capsule Take 20 mg by mouth daily.    Marland Kitchen ezetimibe (ZETIA) 10 MG tablet Take 1 tablet (10 mg total) by mouth daily. 90 tablet 3  . isosorbide mononitrate (IMDUR) 30 MG 24 hr tablet Take 1 tablet (30 mg total) by mouth daily.    Marland Kitchen losartan (COZAAR) 25 MG tablet Take 1 tablet (25 mg total) by mouth daily. 90 tablet 2  . REPATHA SURECLICK 643 MG/ML SOAJ Inject 140 mg into the skin every 14 (fourteen) days. 6 pen 3  . sucralfate (CARAFATE) 1 g tablet Take 1 g by mouth 4 (four) times daily.    . TOPROL XL 100 MG 24 hr tablet Take 150 mg by mouth daily.     No current facility-administered medications for this visit.     Allergies:   Penicillins; Sulfa antibiotics; Etodolac; Lipitor [atorvastatin calcium]; Paxil [paroxetine hcl]; Statins; and Sulfasalazine    Social History:  The patient  reports that he quit smoking about 16 years ago. His smoking use included cigarettes. He has a 15.00 pack-year smoking history. He has never used smokeless tobacco. He reports that he does not drink alcohol or use drugs.   Family History:  The patient's family history includes Heart attack in his sister; Microcephaly in his sister; Other in his father and mother.    ROS:  Please see the history of present illness.   Otherwise, review of systems are positive for none.   All other systems are reviewed and negative.    PHYSICAL EXAM: VS:  BP 130/80 (BP Location: Left Arm, Patient  Position: Sitting, Cuff Size: Normal)   Pulse 70   Ht 5\' 9"  (1.753 m)   Wt 230 lb 8 oz (104.6 kg)   BMI 34.04 kg/m  , BMI Body mass index is 34.04 kg/m. GEN: Well nourished, well developed, in no acute distress  HEENT: normal  Neck: no JVD, carotid bruits, or masses Cardiac: RRR; no murmurs, rubs, or gallops,no edema  Respiratory:  clear to auscultation bilaterally, normal work of breathing GI: soft, nontender, nondistended, + BS MS: no deformity or atrophy  Skin: warm and dry, no rash Neuro:  Strength and sensation are intact Psych: euthymic mood, full affect   EKG:  EKG is not ordered today.   Recent Labs: 12/12/2017: Hemoglobin 16.6; Platelets 251 03/15/2018: ALT 23; BUN 16; Creatinine, Ser 1.02; Magnesium 2.2; Potassium 4.7; Sodium 139; TSH 1.460    Lipid Panel  Component Value Date/Time   CHOL 180 03/15/2018 1424   TRIG 102 03/15/2018 1424   HDL 53 03/15/2018 1424   CHOLHDL 3.4 03/15/2018 1424   CHOLHDL 4.8 12/13/2017 0523   VLDL 22 12/13/2017 0523   LDLCALC 107 (H) 03/15/2018 1424      Wt Readings from Last 3 Encounters:  06/28/18 230 lb 8 oz (104.6 kg)  04/29/18 225 lb (102.1 kg)  03/26/18 230 lb 8 oz (104.6 kg)      PAD Screen 12/25/2017  Previous PAD dx? No  Previous surgical procedure? No  Pain with walking? No  Feet/toe relief with dangling? No  Painful, non-healing ulcers? No  Extremities discolored? No      ASSESSMENT AND PLAN:  1.  Coronary artery disease involving bypass graft with other forms of angina: His symptoms are well controlled on current medications.    Cardiac catheterization in January was reassuring although he does have moderate ostial RCA stenosis.  Continue to monitor this and if his symptoms worsen, I will consider proceeding with FFR evaluation.  We can also consider adding Ranexa.  2.  Essential hypertension: Blood pressure is controlled on current medications.  3.  Hyperlipidemia: Continue treatment with Repatha and  Zetia.  He is going to have routine labs with his primary care physician in the next 2 weeks.  I suspect significant improvement in his lipid profile.  4.  PVCs: Seems to be controlled with current dose of Toprol.  Continue to monitor closely and if there is worsening, we can consider an antiarrhythmic medication or Ranexa.  5.  GERD: Worsening symptoms recently.  I switch Nexium to Protonix 40 mg once daily especially that he is on Plavix.    Disposition:   FU with me in 4 months  Signed,  [name], MD  06/28/2018 2:24 PM    [place]

## 2018-07-08 DIAGNOSIS — I10 Essential (primary) hypertension: Secondary | ICD-10-CM | POA: Diagnosis not present

## 2018-07-08 DIAGNOSIS — E7800 Pure hypercholesterolemia, unspecified: Secondary | ICD-10-CM | POA: Diagnosis not present

## 2018-07-15 DIAGNOSIS — I2510 Atherosclerotic heart disease of native coronary artery without angina pectoris: Secondary | ICD-10-CM | POA: Diagnosis not present

## 2018-07-15 DIAGNOSIS — E7800 Pure hypercholesterolemia, unspecified: Secondary | ICD-10-CM | POA: Diagnosis not present

## 2018-07-15 DIAGNOSIS — I10 Essential (primary) hypertension: Secondary | ICD-10-CM | POA: Diagnosis not present

## 2018-07-15 DIAGNOSIS — K219 Gastro-esophageal reflux disease without esophagitis: Secondary | ICD-10-CM | POA: Diagnosis not present

## 2018-08-23 ENCOUNTER — Other Ambulatory Visit: Payer: Self-pay | Admitting: Cardiovascular Disease

## 2018-08-23 MED ORDER — ISOSORBIDE MONONITRATE ER 30 MG PO TB24: 30.0000 mg | ORAL_TABLET | Freq: Every day | ORAL | 1 refills | Status: DC

## 2018-08-23 MED ORDER — CLOPIDOGREL BISULFATE 75 MG PO TABS: 75.0000 mg | ORAL_TABLET | Freq: Every day | ORAL | 1 refills | Status: DC

## 2018-08-23 MED ORDER — ISOSORBIDE MONONITRATE ER 30 MG PO TB24: 30.0000 mg | ORAL_TABLET | Freq: Every day | ORAL | Status: DC

## 2018-08-23 NOTE — Addendum Note (Signed)
Addended by: Alba Destine on: 08/23/2018 09:38 AM   Modules accepted: Orders

## 2018-08-23 NOTE — Telephone Encounter (Signed)
°*  STAT* If patient is at the pharmacy, call can be transferred to refill team.   1. Which medications need to be refilled? (please list name of each medication and dose if known)  Losartan (COZAAR) 25 MG - 1 tablet daily Isosorbide mononitrate (IMDUR) 30 MG - 1 tablet daily  2. Which pharmacy/location (including street and city if local pharmacy) is medication to be sent to? [facility]   3. Do they need a 30 day or 90 day supply? States he gets them for a year.  Patient is just a day or so to being out of IMDUR medication, if possible please send a 30 day perscription to [facility] on [address].

## 2018-09-08 ENCOUNTER — Other Ambulatory Visit: Payer: Self-pay | Admitting: Cardiovascular Disease

## 2018-10-07 ENCOUNTER — Telehealth: Payer: Self-pay | Admitting: Student-PharmD

## 2018-10-07 ENCOUNTER — Telehealth: Payer: Self-pay

## 2018-10-07 NOTE — Telephone Encounter (Signed)
Prior Auth Submitted through [employer]. Chase Scott (Key: [id number]) Repatha SureClick 140MG /ML auto-injectors If [employer] has not replied to your request within 24-72 hours please contact [place] at [phone].

## 2018-10-07 NOTE — Telephone Encounter (Signed)
Patient called back to verify Member ID

## 2018-10-07 NOTE — Telephone Encounter (Signed)
Message sent to Dr. Tyrell Antonio nurse as pt will need labs while on therapy before reauth can be sent.

## 2018-10-07 NOTE — Telephone Encounter (Signed)
LMOV to verify patients insurance and member ID number for Prior auth for [place].

## 2018-10-07 NOTE — Telephone Encounter (Signed)
Called Mr. Bagshaw to obtain new insurance information to resubmit Repatha PA. Patient states that although he has recently retired, his insurance is still with [employer], under his wife's name Mariann Laster). Will renew PA with the insurance that we have on file.

## 2018-10-08 ENCOUNTER — Telehealth: Payer: Self-pay | Admitting: *Deleted

## 2018-10-08 DIAGNOSIS — E785 Hyperlipidemia, unspecified: Secondary | ICD-10-CM

## 2018-10-08 NOTE — Telephone Encounter (Signed)
Call placed to the patient. He will need to have fasting lab work for reauthorization for [employer]. Orders have been placed. He stated that he will go Thursday morning.

## 2018-10-10 ENCOUNTER — Other Ambulatory Visit
Admission: RE | Admit: 2018-10-10 | Discharge: 2018-10-10 | Disposition: A | Discharge status: Home / Self Care | Payer: BLUE CROSS/BLUE SHIELD | Source: Ambulatory Visit | Attending: Cardiovascular Disease | Admitting: Cardiovascular Disease

## 2018-10-10 DIAGNOSIS — E785 Hyperlipidemia, unspecified: Secondary | ICD-10-CM | POA: Insufficient documentation

## 2018-10-10 LAB — HEPATIC FUNCTION PANEL
ALBUMIN: 4.1 g/dL (ref 3.5–5.0)
ALT: 20 U/L (ref 0–44)
AST: 19 U/L (ref 15–41)
Alkaline Phosphatase: 46 U/L (ref 38–126)
BILIRUBIN TOTAL: 1.3 mg/dL — AB (ref 0.3–1.2)
Bilirubin, Direct: 0.3 mg/dL — ABNORMAL HIGH (ref 0.0–0.2)
Indirect Bilirubin: 1 mg/dL — ABNORMAL HIGH (ref 0.3–0.9)
Total Protein: 6.7 g/dL (ref 6.5–8.1)

## 2018-10-10 LAB — LIPID PANEL
CHOLESTEROL: 80 mg/dL (ref 0–200)
HDL: 58 mg/dL (ref >40)
LDL Cholesterol: 13 mg/dL (ref 0–99)
Total CHOL/HDL Ratio: 1.4 RATIO
Triglycerides: 43 mg/dL (ref <150)
VLDL: 9 mg/dL (ref 0–40)

## 2018-10-11 ENCOUNTER — Telehealth: Payer: Self-pay | Admitting: *Deleted

## 2018-10-11 NOTE — Telephone Encounter (Signed)
-----   Message from Wellington Hampshire, MD sent at 10/10/2018  2:01 PM EST ----- Inform patient that labs were fine.  His cholesterol improved significantly with Repatha which should be continued.  We can stop Zetia given that his cholesterol is now very low.

## 2018-10-11 NOTE — Telephone Encounter (Signed)
Patient made aware of results and verbalized understanding.  He will discontinue the Zetia.

## 2018-10-14 MED ORDER — REPATHA SURECLICK 140 MG/ML ~~LOC~~ SOAJ: 140.0000 mg | SUBCUTANEOUS | 3 refills | Status: DC

## 2018-10-14 NOTE — Telephone Encounter (Signed)
Pt approved for Repatha through insurance. Rx sent to [facility] as per pt request.

## 2018-11-01 ENCOUNTER — Telehealth: Payer: Self-pay | Admitting: Cardiovascular Disease

## 2018-11-01 ENCOUNTER — Ambulatory Visit: Payer: BLUE CROSS/BLUE SHIELD | Admitting: Cardiovascular Disease

## 2018-11-01 ENCOUNTER — Other Ambulatory Visit: Payer: Self-pay | Admitting: *Deleted

## 2018-11-01 ENCOUNTER — Encounter: Payer: Self-pay | Admitting: Cardiovascular Disease

## 2018-11-01 VITALS — BP 138/78 | HR 75 | Ht 69.0 in | Wt 234.5 lb

## 2018-11-01 DIAGNOSIS — I25708 Atherosclerosis of coronary artery bypass graft(s), unspecified, with other forms of angina pectoris: Secondary | ICD-10-CM

## 2018-11-01 DIAGNOSIS — I493 Ventricular premature depolarization: Secondary | ICD-10-CM | POA: Diagnosis not present

## 2018-11-01 DIAGNOSIS — I10 Essential (primary) hypertension: Secondary | ICD-10-CM

## 2018-11-01 DIAGNOSIS — E785 Hyperlipidemia, unspecified: Secondary | ICD-10-CM | POA: Diagnosis not present

## 2018-11-01 MED ORDER — RANOLAZINE ER 500 MG PO TB12: 500.0000 mg | ORAL_TABLET | Freq: Two times a day (BID) | ORAL | 0 refills | Status: DC

## 2018-11-01 MED ORDER — ISOSORBIDE MONONITRATE ER 30 MG PO TB24: 30.0000 mg | ORAL_TABLET | Freq: Every day | ORAL | 1 refills | Status: DC

## 2018-11-01 MED ORDER — LOSARTAN POTASSIUM 25 MG PO TABS: 25.0000 mg | ORAL_TABLET | Freq: Every day | ORAL | 1 refills | Status: DC

## 2018-11-01 MED ORDER — RANOLAZINE ER 500 MG PO TB12: 500.0000 mg | ORAL_TABLET | Freq: Two times a day (BID) | ORAL | 1 refills | Status: DC

## 2018-11-01 NOTE — Progress Notes (Signed)
Cardiology Office Note   Date:  11/01/2018   ID:  [name], [name] 17-Mar-1956, MRN [id number]  PCP:  Baxter Hire, MD  Cardiologist:   [name], MD   Chief Complaint  Patient presents with  . other    4 month f/u c/o chest pain. Meds reviewed verbally with pt. Meds reviewed verbally with pt.      History of Present Illness: Chase Scott is a 62 y.o. male who presents for a follow-up visit regarding coronary artery disease.  He has known history of coronary artery disease status post CABG in 2004 with subsequent PCI of left circumflex and right coronary artery.  He also has chronic diastolic heart failure, hypertension and hyperlipidemia. He was hospitalized in January, 2019 with exertional chest tightness.  EKG showed possible old inferior infarct.  He ruled out for myocardial infarction by enzymes.  He underwent a nuclear stress test which showed evidence of prior infarct in the basal anterolateral and mid anterolateral location without significant reversibility.  EF was 43%.  The study was intermediate risk.  Echocardiogram showed an EF of 50% with grade 1 diastolic dysfunction. Cardiac catheterization showed significant underlying three-vessel coronary artery disease with patent stents in the left circumflex and right coronary arteries.  There was patent LIMA to LAD and SVG to diagonal.  SVG to left circumflex was chronically occluded with atretic RIMA to RCA.  There was moderate ostial RCA stenosis with catheter-induced spasm that improved with nitroglycerin.  Left ventricular end-diastolic pressure was normal. He was treated medically.    He is known to have PVCs.  Holter monitor in April showed 28,000 PVC in 48 hours representing 18% burden.  Average heart rate was 71 bpm.     He has known history of hyperlipidemia with intolerance to statins.  He was started on Repatha and he has been tolerating the medication without side effects.  He continues to report  intermittent chest pain which is difficult for him to distinguish whether cardiac or due to GERD.  He does complain of tightness feeling.  He also seems to be more aware of the PVCs with discomfort and irregularity.  Past Medical History:  Diagnosis Date  . Alcoholism ([place])   . Anginal pain ([place])   . CAD (coronary artery disease)    a. 01/2003 s/p PCI to RCA;  b. 02/2003 s/ CABG x 4 (LIMA->LAD, RIMA->RCA, VG->Diag, VG->LCX; c. 2005 s/p PCI to OM2; d. 06/2012 Cath: LM nl, LAD 11m, LCX mild plaque, OM1 min irregs, OM2 patent stent, RCA 41m ISR, VG->LCX 100, VG->Diag ok, RIMA->RCA 100, LIMA->LAD ok, EF 50-55%-->Med Rx.  . Chronic Chest pain   . Diastolic dysfunction    a. 11/2017 Echo: EF 50-55%, no rwma, Gr1 DD, mildly dil LA.  Marland Kitchen Dyslipidemia   . Gastroesophageal reflux disease   . Gastrointestinal symptoms    Chronic gastrointestinal symptoms  . History of kidney stones   . History of left shoulder fracture   . Hyperlipidemia   . Hypertension   . Hypertensive cardiovascular disease    a. Labile BPs.  . Rotator cuff syndrome     Past Surgical History:  Procedure Laterality Date  . Arthroscopic Knee Surgery Right 2009  . Arthroscopic Knee Surgery Left 10/2017  . BACK SURGERY     Lower  . CARDIAC CATHETERIZATION    . COLONOSCOPY WITH PROPOFOL N/A 04/29/2018   Procedure: COLONOSCOPY WITH PROPOFOL;  Surgeon: Manya Silvas, MD;  Location: [facility] ENDOSCOPY;  Service: Endoscopy;  Laterality: N/A;  . CORONARY ARTERY BYPASS GRAFT  April 2004   x four  . ESOPHAGOGASTRODUODENOSCOPY (EGD) WITH PROPOFOL N/A 04/29/2018   Procedure: ESOPHAGOGASTRODUODENOSCOPY (EGD) WITH PROPOFOL;  Surgeon: Manya Silvas, MD;  Location: [facility] ENDOSCOPY;  Service: Endoscopy;  Laterality: N/A;  . LEFT HEART CATH AND CORONARY ANGIOGRAPHY N/A 12/14/2017   Procedure: LEFT HEART CATH AND CORONARY ANGIOGRAPHY;  Surgeon: Wellington Hampshire, MD;  Location: [place] CV LAB;  Service: Cardiovascular;  Laterality: N/A;    . LEFT HEART CATHETERIZATION WITH CORONARY/GRAFT ANGIOGRAM  07/02/2012   Procedure: LEFT HEART CATHETERIZATION WITH Beatrix Fetters;  Surgeon: Burnell Blanks, MD;  Location: [facility] CATH LAB;  Service: Cardiovascular;;  . TONSILLECTOMY       Current Outpatient Medications  Medication Sig Dispense Refill  . aspirin EC 81 MG EC tablet Take 1 tablet (81 mg total) by mouth daily. 180 tablet 0  . clopidogrel (PLAVIX) 75 MG tablet Take 1 tablet (75 mg total) by mouth daily. 90 tablet 1  . isosorbide mononitrate (IMDUR) 30 MG 24 hr tablet Take 1 tablet (30 mg total) by mouth daily. 90 tablet 1  . losartan (COZAAR) 25 MG tablet TAKE 1 TABLET DAILY 90 tablet 0  . pantoprazole (PROTONIX) 40 MG tablet Take 1 tablet (40 mg total) by mouth daily. 90 tablet 3  . REPATHA SURECLICK 440 MG/ML SOAJ Inject 140 mg into the skin every 14 (fourteen) days. 6 pen 3  . sucralfate (CARAFATE) 1 g tablet Take 1 g by mouth as needed.     . TOPROL XL 100 MG 24 hr tablet Take 150 mg by mouth daily.     No current facility-administered medications for this visit.     Allergies:   Penicillins; Sulfa antibiotics; Etodolac; Lipitor [atorvastatin calcium]; Paxil [paroxetine hcl]; Statins; and Sulfasalazine    Social History:  The patient  reports that he quit smoking about 16 years ago. His smoking use included cigarettes. He has a 15.00 pack-year smoking history. He has never used smokeless tobacco. He reports that he does not drink alcohol or use drugs.   Family History:  The patient's family history includes Heart attack in his sister; Microcephaly in his sister; Other in his father and mother.    ROS:  Please see the history of present illness.   Otherwise, review of systems are positive for none.   All other systems are reviewed and negative.    PHYSICAL EXAM: VS:  BP 138/78 (BP Location: Left Arm, Patient Position: Sitting, Cuff Size: Normal)   Pulse 75   Ht 5\' 9"  (1.753 m)   Wt 234 lb 8 oz (106.4  kg)   BMI 34.63 kg/m  , BMI Body mass index is 34.63 kg/m. GEN: Well nourished, well developed, in no acute distress  HEENT: normal  Neck: no JVD, carotid bruits, or masses Cardiac: RRR; no murmurs, rubs, or gallops,no edema  Respiratory:  clear to auscultation bilaterally, normal work of breathing GI: soft, nontender, nondistended, + BS MS: no deformity or atrophy  Skin: warm and dry, no rash Neuro:  Strength and sensation are intact Psych: euthymic mood, full affect   EKG:  EKG is  ordered today. EKG showed sinus rhythm with frequent PVCs  Recent Labs: 12/12/2017: Hemoglobin 16.6; Platelets 251 03/15/2018: BUN 16; Creatinine, Ser 1.02; Magnesium 2.2; Potassium 4.7; Sodium 139; TSH 1.460 10/10/2018: ALT 20    Lipid Panel    Component Value Date/Time   CHOL 80 10/10/2018 0920  CHOL 180 03/15/2018 1424   TRIG 43 10/10/2018 0920   HDL 58 10/10/2018 0920   HDL 53 03/15/2018 1424   CHOLHDL 1.4 10/10/2018 0920   VLDL 9 10/10/2018 0920   LDLCALC 13 10/10/2018 0920   LDLCALC 107 (H) 03/15/2018 1424      Wt Readings from Last 3 Encounters:  11/01/18 234 lb 8 oz (106.4 kg)  06/28/18 230 lb 8 oz (104.6 kg)  04/29/18 225 lb (102.1 kg)      PAD Screen 12/25/2017  Previous PAD dx? No  Previous surgical procedure? No  Pain with walking? No  Feet/toe relief with dangling? No  Painful, non-healing ulcers? No  Extremities discolored? No      ASSESSMENT AND PLAN:  1.  Coronary artery disease involving bypass graft with other forms of angina: The patient seems to have intermittent chest pain and exertional dyspnea with lack of stamina which he reports has been chronic for him.  Some of his symptoms seem to be anginal but he also has other symptoms that are clearly due to GERD and also has an element of physical deconditioning.  PVCs are likely also contributing.  I elected to add Ranexa 500 mg twice daily for symptomatic relief.    Cardiac catheterization in January was  reassuring although he does have moderate ostial RCA stenosis.  Continue to monitor this and if his symptoms worsen, I will consider proceeding with FFR evaluation.    2.  Essential hypertension: Blood pressure is controlled on current medications.  3.  Hyperlipidemia: Continue treatment with Repatha and Zetia.  Follow-up lipid profile showed significant improvement.  4.  PVCs: These do not seem to be well controlled with Toprol.  I added Ranexa as outlined above.  If he continues to be symptomatic, I will consider referral to electrophysiology.  5.  Obesity and physical deconditioning.  I discussed with him the importance of starting an exercise program and attempting weight loss.  He has more time now given that he retired.    Disposition:   FU with me in 4 months  Signed,  [name], MD  11/01/2018 2:16 PM    [place]

## 2018-11-01 NOTE — Patient Instructions (Signed)
Medication Instructions:  START Ranexa 500 mg twice daily  If you need a refill on your cardiac medications before your next appointment, please call your pharmacy.   Lab work: None ordered.  Testing/Procedures: None ordered  Follow-Up: At [facility], you and your health needs are our priority.  As part of our continuing mission to provide you with exceptional heart care, we have created designated Provider Care Teams.  These Care Teams include your primary Cardiologist (physician) and Advanced Practice Providers (APPs -  Physician Assistants and Nurse Practitioners) who all work together to provide you with the care you need, when you need it. You will need a follow up appointment in 4 months.You may see [name], MD or one of the following Advanced Practice Providers on your designated Care Team:   Murray Hodgkins, NP Christell Faith, PA-C . Marrianne Mood, PA-C

## 2018-11-01 NOTE — Telephone Encounter (Signed)
Recieved request from : [employer]  Entered Release  Forwarded to [employer] for processing

## 2018-11-04 ENCOUNTER — Telehealth: Payer: Self-pay

## 2018-11-04 NOTE — Telephone Encounter (Signed)
Prior Auth started through [employer] PA has been faxed to the plan as a paper copy. Please contact the plan directly if you haven't received a determination in a typical timeframe.  You will be notified of the determination via fax.  Contact plan to follow up on [id number]

## 2018-11-05 NOTE — Telephone Encounter (Signed)
Fax received.  Prior Auth Approved from 09/05/2018 to 11/05/2019  Fax states we will receive an official hard copy of the decision letter vis the [place] postal services.

## 2018-11-07 ENCOUNTER — Telehealth: Payer: Self-pay | Admitting: Cardiovascular Disease

## 2018-11-07 NOTE — Telephone Encounter (Signed)
Please call pt , pt states his pharmacy has questions regarding Ranexa

## 2018-11-07 NOTE — Telephone Encounter (Signed)
Patient called to check on the PA for the Ranxea. He has been informed that it was approved and should call us back if he has any trouble getting his medication. He verbalized his understanding.

## 2018-12-03 ENCOUNTER — Other Ambulatory Visit: Payer: Self-pay | Admitting: *Deleted

## 2018-12-03 MED ORDER — RANOLAZINE ER 500 MG PO TB12: 500.0000 mg | ORAL_TABLET | Freq: Two times a day (BID) | ORAL | 1 refills | Status: DC

## 2019-01-03 ENCOUNTER — Observation Stay
Admission: EM | Admit: 2019-01-03 | Discharge: 2019-01-05 | Disposition: A | Discharge status: Home / Self Care | Payer: BLUE CROSS/BLUE SHIELD | Attending: Internal Medicine | Admitting: Internal Medicine

## 2019-01-03 ENCOUNTER — Emergency Department: Payer: BLUE CROSS/BLUE SHIELD

## 2019-01-03 ENCOUNTER — Other Ambulatory Visit: Payer: Self-pay

## 2019-01-03 DIAGNOSIS — I25110 Atherosclerotic heart disease of native coronary artery with unstable angina pectoris: Secondary | ICD-10-CM

## 2019-01-03 DIAGNOSIS — Z951 Presence of aortocoronary bypass graft: Secondary | ICD-10-CM | POA: Insufficient documentation

## 2019-01-03 DIAGNOSIS — Z87442 Personal history of urinary calculi: Secondary | ICD-10-CM | POA: Insufficient documentation

## 2019-01-03 DIAGNOSIS — I2510 Atherosclerotic heart disease of native coronary artery without angina pectoris: Secondary | ICD-10-CM | POA: Diagnosis not present

## 2019-01-03 DIAGNOSIS — Z9114 Patient's other noncompliance with medication regimen: Secondary | ICD-10-CM | POA: Diagnosis not present

## 2019-01-03 DIAGNOSIS — E785 Hyperlipidemia, unspecified: Secondary | ICD-10-CM | POA: Diagnosis not present

## 2019-01-03 DIAGNOSIS — Z955 Presence of coronary angioplasty implant and graft: Secondary | ICD-10-CM | POA: Insufficient documentation

## 2019-01-03 DIAGNOSIS — I5032 Chronic diastolic (congestive) heart failure: Secondary | ICD-10-CM | POA: Diagnosis not present

## 2019-01-03 DIAGNOSIS — Z7902 Long term (current) use of antithrombotics/antiplatelets: Secondary | ICD-10-CM | POA: Insufficient documentation

## 2019-01-03 DIAGNOSIS — R1013 Epigastric pain: Secondary | ICD-10-CM | POA: Diagnosis not present

## 2019-01-03 DIAGNOSIS — R079 Chest pain, unspecified: Principal | ICD-10-CM | POA: Insufficient documentation

## 2019-01-03 DIAGNOSIS — G8929 Other chronic pain: Secondary | ICD-10-CM | POA: Diagnosis not present

## 2019-01-03 DIAGNOSIS — Z7982 Long term (current) use of aspirin: Secondary | ICD-10-CM | POA: Diagnosis not present

## 2019-01-03 DIAGNOSIS — Z87891 Personal history of nicotine dependence: Secondary | ICD-10-CM | POA: Insufficient documentation

## 2019-01-03 DIAGNOSIS — K219 Gastro-esophageal reflux disease without esophagitis: Secondary | ICD-10-CM | POA: Insufficient documentation

## 2019-01-03 DIAGNOSIS — R739 Hyperglycemia, unspecified: Secondary | ICD-10-CM | POA: Diagnosis not present

## 2019-01-03 DIAGNOSIS — I110 Hypertensive heart disease with heart failure: Secondary | ICD-10-CM | POA: Diagnosis not present

## 2019-01-03 DIAGNOSIS — I10 Essential (primary) hypertension: Secondary | ICD-10-CM | POA: Diagnosis not present

## 2019-01-03 DIAGNOSIS — Z79899 Other long term (current) drug therapy: Secondary | ICD-10-CM | POA: Insufficient documentation

## 2019-01-03 DIAGNOSIS — I252 Old myocardial infarction: Secondary | ICD-10-CM | POA: Insufficient documentation

## 2019-01-03 DIAGNOSIS — Z8249 Family history of ischemic heart disease and other diseases of the circulatory system: Secondary | ICD-10-CM | POA: Insufficient documentation

## 2019-01-03 LAB — BASIC METABOLIC PANEL
Anion gap: 6 (ref 5–15)
BUN: 14 mg/dL (ref 8–23)
CO2: 27 mmol/L (ref 22–32)
Calcium: 8.8 mg/dL — ABNORMAL LOW (ref 8.9–10.3)
Chloride: 103 mmol/L (ref 98–111)
Creatinine, Ser: 1.05 mg/dL (ref 0.61–1.24)
GFR calc Af Amer: >60 mL/min (ref >60)
GFR calc non Af Amer: >60 mL/min (ref >60)
Glucose, Bld: 145 mg/dL — ABNORMAL HIGH (ref 70–99)
Potassium: 3.5 mmol/L (ref 3.5–5.1)
Sodium: 136 mmol/L (ref 135–145)

## 2019-01-03 LAB — HEPATIC FUNCTION PANEL
ALT: 19 U/L (ref 0–44)
AST: 22 U/L (ref 15–41)
Albumin: 4.3 g/dL (ref 3.5–5.0)
Alkaline Phosphatase: 47 U/L (ref 38–126)
Bilirubin, Direct: 0.2 mg/dL (ref 0.0–0.2)
Indirect Bilirubin: 0.6 mg/dL (ref 0.3–0.9)
TOTAL PROTEIN: 6.9 g/dL (ref 6.5–8.1)
Total Bilirubin: 0.8 mg/dL (ref 0.3–1.2)

## 2019-01-03 LAB — CBC
HEMATOCRIT: 43.1 % (ref 39.0–52.0)
Hemoglobin: 15.1 g/dL (ref 13.0–17.0)
MCH: 31.1 pg (ref 26.0–34.0)
MCHC: 35 g/dL (ref 30.0–36.0)
MCV: 88.7 fL (ref 80.0–100.0)
Platelets: 243 10*3/uL (ref 150–400)
RBC: 4.86 MIL/uL (ref 4.22–5.81)
RDW: 12 % (ref 11.5–15.5)
WBC: 9.3 10*3/uL (ref 4.0–10.5)
nRBC: 0 % (ref 0.0–0.2)

## 2019-01-03 LAB — TROPONIN I: Troponin I: <0.03 ng/mL (ref <0.03)

## 2019-01-03 LAB — LIPASE, BLOOD: Lipase: 25 U/L (ref 11–51)

## 2019-01-03 MED ORDER — ASPIRIN 81 MG PO CHEW
324.0000 mg | CHEWABLE_TABLET | Freq: Once | ORAL | Status: AC
  Administered 2019-01-03: 324 mg via ORAL
  Filled 2019-01-03: qty 4

## 2019-01-03 MED ORDER — NITROGLYCERIN 2 % TD OINT
0.5000 [in_us] | TOPICAL_OINTMENT | TRANSDERMAL | Status: AC
  Administered 2019-01-03: 0.5 [in_us] via TOPICAL
  Filled 2019-01-03: qty 1

## 2019-01-03 MED ORDER — MORPHINE SULFATE (PF) 2 MG/ML IV SOLN
2.0000 mg | INTRAVENOUS | Status: DC | PRN
  Administered 2019-01-04: 2 mg via INTRAVENOUS
  Filled 2019-01-03: qty 1

## 2019-01-03 MED ORDER — ASPIRIN 81 MG PO CHEW
81.0000 mg | CHEWABLE_TABLET | Freq: Every day | ORAL | Status: DC
  Administered 2019-01-04 – 2019-01-05 (×2): 81 mg via ORAL
  Filled 2019-01-03 (×2): qty 1

## 2019-01-03 MED ORDER — METOPROLOL TARTRATE 25 MG PO TABS
25.0000 mg | ORAL_TABLET | ORAL | Status: AC
  Administered 2019-01-03: 25 mg via ORAL
  Filled 2019-01-03: qty 1

## 2019-01-03 MED ORDER — NITROGLYCERIN 0.4 MG SL SUBL: 0.4000 mg | SUBLINGUAL_TABLET | SUBLINGUAL | Status: DC | PRN

## 2019-01-03 NOTE — ED Notes (Signed)
Pt disconnected from monitor to go to bathroom. Pt able to ambulate independently to bathroom. Pt denies chest pain or shortness of breath with ambulation. This RN will continue to monitor.

## 2019-01-03 NOTE — ED Notes (Signed)
Pt denying shortness of breath, n/v at this time. This RN will continue to monitor.

## 2019-01-03 NOTE — ED Provider Notes (Signed)
[facility] Emergency Department Provider Note ____________________________________________   First MD Initiated Contact with Patient 01/03/19 2038     (approximate)  I have reviewed the triage vital signs and the nursing notes.  HISTORY  Chief Complaint Chest Pain  HPI Chase Scott is a 63 y.o. male history of known coronary disease, angina  Patient reports that about 4:35 PM a sudden severe worsening of his chronic chest pain located below his breastbone felt as a heavy pressure with associated nausea and feeling and looking pale.  No shortness of breath.  No leg swelling.  Otherwise in his normal health.  Compliant with his medications, does not take aspirin daily at this time.  Follows with cardiology has a history of coronary bypass, patient reports similar symptoms with angina but reports this is the worst he is ever had and it came very unexpectedly.  Did not occur with exertion.  Patient states he reports the past pressure seems to have's alleviated itself quite a bit right now, only a very mild pressure in the chest just slightly more than his typical daily symptoms.  Past Medical History:  Diagnosis Date  . Alcoholism ([place])   . Anginal pain ([place])   . CAD (coronary artery disease)    a. 01/2003 s/p PCI to RCA;  b. 02/2003 s/ CABG x 4 (LIMA->LAD, RIMA->RCA, VG->Diag, VG->LCX; c. 2005 s/p PCI to OM2; d. 06/2012 Cath: LM nl, LAD 133m, LCX mild plaque, OM1 min irregs, OM2 patent stent, RCA 27m ISR, VG->LCX 100, VG->Diag ok, RIMA->RCA 100, LIMA->LAD ok, EF 50-55%-->Med Rx.  . Chronic Chest pain   . Diastolic dysfunction    a. 11/2017 Echo: EF 50-55%, no rwma, Gr1 DD, mildly dil LA.  Marland Kitchen Dyslipidemia   . Gastroesophageal reflux disease   . Gastrointestinal symptoms    Chronic gastrointestinal symptoms  . History of kidney stones   . History of left shoulder fracture   . Hyperlipidemia   . Hypertension   . Hypertensive cardiovascular disease    a. Labile  BPs.  . Rotator cuff syndrome     Patient Active Problem List   Diagnosis Date Noted  . PVC's (premature ventricular contractions) 03/15/2018  . Fatigue 03/15/2018  . Dyspnea on exertion 03/15/2018  . Angina pectoris ([place]) 12/12/2017  . CAD (coronary artery disease) 07/02/2012  . Hypertensive cardiovascular disease 07/02/2012  . Chest pain 07/02/2012  . Ischemic heart disease 02/16/2011  . Hypertension 02/16/2011  . Hypercholesterolemia 02/16/2011  . Hx of CABG 02/16/2011  . GERD (gastroesophageal reflux disease) 02/16/2011    Past Surgical History:  Procedure Laterality Date  . Arthroscopic Knee Surgery Right 2009  . Arthroscopic Knee Surgery Left 10/2017  . BACK SURGERY     Lower  . CARDIAC CATHETERIZATION    . COLONOSCOPY WITH PROPOFOL N/A 04/29/2018   Procedure: COLONOSCOPY WITH PROPOFOL;  Surgeon: Manya Silvas, MD;  Location: [facility] ENDOSCOPY;  Service: Endoscopy;  Laterality: N/A;  . CORONARY ARTERY BYPASS GRAFT  April 2004   x four  . ESOPHAGOGASTRODUODENOSCOPY (EGD) WITH PROPOFOL N/A 04/29/2018   Procedure: ESOPHAGOGASTRODUODENOSCOPY (EGD) WITH PROPOFOL;  Surgeon: Manya Silvas, MD;  Location: [facility] ENDOSCOPY;  Service: Endoscopy;  Laterality: N/A;  . LEFT HEART CATH AND CORONARY ANGIOGRAPHY N/A 12/14/2017   Procedure: LEFT HEART CATH AND CORONARY ANGIOGRAPHY;  Surgeon: Wellington Hampshire, MD;  Location: [place] CV LAB;  Service: Cardiovascular;  Laterality: N/A;  . LEFT HEART CATHETERIZATION WITH CORONARY/GRAFT ANGIOGRAM  07/02/2012   Procedure:  LEFT HEART CATHETERIZATION WITH Beatrix Fetters;  Surgeon: Burnell Blanks, MD;  Location: [facility] CATH LAB;  Service: Cardiovascular;;  . TONSILLECTOMY      Prior to Admission medications   Medication Sig Start Date End Date Taking? Authorizing Provider  aspirin EC 81 MG EC tablet Take 1 tablet (81 mg total) by mouth daily. 12/15/17   Salary, Holly Bodily D, MD  clopidogrel (PLAVIX) 75 MG tablet Take 1 tablet  (75 mg total) by mouth daily. 08/23/18   Wellington Hampshire, MD  isosorbide mononitrate (IMDUR) 30 MG 24 hr tablet Take 1 tablet (30 mg total) by mouth daily. 11/01/18   Wellington Hampshire, MD  losartan (COZAAR) 25 MG tablet Take 1 tablet (25 mg total) by mouth daily. 11/01/18   Wellington Hampshire, MD  pantoprazole (PROTONIX) 40 MG tablet Take 1 tablet (40 mg total) by mouth daily. 06/28/18   Wellington Hampshire, MD  ranolazine (RANEXA) 500 MG 12 hr tablet Take 1 tablet (500 mg total) by mouth 2 (two) times daily. 12/03/18   Wellington Hampshire, MD  REPATHA SURECLICK 132 MG/ML SOAJ Inject 140 mg into the skin every 14 (fourteen) days. 10/14/18   Wellington Hampshire, MD  sucralfate (CARAFATE) 1 g tablet Take 1 g by mouth as needed.     [provider]  TOPROL XL 100 MG 24 hr tablet Take 150 mg by mouth daily. 10/30/17   [provider]    Allergies Penicillins; Sulfa antibiotics; Etodolac; Lipitor [atorvastatin calcium]; Paxil [paroxetine hcl]; Statins; and Sulfasalazine  Family History  Problem Relation Age of Onset  . Other Mother        - "back problems and breathing problems."  . Other Father        Died @ age 37 - pt doesn't know father's medical history.  . Microcephaly Sister   . Heart attack Sister     Social History Social History   Tobacco Use  . Smoking status: Former Smoker    Packs/day: 1.00    Years: 15.00    Pack years: 15.00    Types: Cigarettes    Last attempt to quit: 12/13/2001    Years since quitting: 17.0  . Smokeless tobacco: Never Used  Substance Use Topics  . Alcohol use: No    Frequency: Never  . Drug use: No    Review of Systems Constitutional: No fever/chills Eyes: No visual changes. ENT: No sore throat. Cardiovascular: See HPI Respiratory: Denies shortness of breath. Gastrointestinal: No abdominal pain.  Got nauseated when the pain was severe. Genitourinary: Negative for dysuria. Musculoskeletal: Negative for back pain. Skin: Negative  for rash. Neurological: Negative for headaches, areas of focal weakness or numbness.    ____________________________________________   PHYSICAL EXAM:  VITAL SIGNS: ED Triage Vitals  Enc Vitals Group     BP 01/03/19 1831 (!) 163/107     Pulse --      Resp 01/03/19 1831 18     Temp --      Temp src --      SpO2 01/03/19 1831 96 %     Weight 01/03/19 1832 230 lb (104.3 kg)     Height 01/03/19 1832 5\' 9"  (1.753 m)     Head Circumference --      Peak Flow --      Pain Score 01/03/19 1832 10     Pain Loc --      Pain Edu? --      Excl. in [place]? --  Constitutional: Alert and oriented. Well appearing and in no acute distress. Eyes: Conjunctivae are normal. Head: Atraumatic. Nose: No congestion/rhinnorhea. Mouth/Throat: Mucous membranes are moist. Neck: No stridor.  Cardiovascular: Normal rate, regular rhythm. Grossly normal heart sounds.  Good peripheral circulation. Respiratory: Normal respiratory effort.  No retractions. Lungs CTAB. Gastrointestinal: Soft and nontender. No distention. Musculoskeletal: No lower extremity tenderness nor edema. Neurologic:  Normal speech and language. No gross focal neurologic deficits are appreciated.  Skin:  Skin is warm, dry and intact. No rash noted. Psychiatric: Mood and affect are normal. Speech and behavior are normal.  ____________________________________________   LABS (all labs ordered are listed, but only abnormal results are displayed)  Labs Reviewed  BASIC METABOLIC PANEL - Abnormal; Notable for the following components:      Result Value   Glucose, Bld 145 (*)    Calcium 8.8 (*)    All other components within normal limits  CBC  TROPONIN I  HEPATIC FUNCTION PANEL  LIPASE, BLOOD   ____________________________________________  EKG  Reviewed and interpreted at 1822 Heart rate 70 QRS 100 QTc 500 Sinus rhythm, frequent PVCs.  No evidence of acute ischemia.  Discussed with Dr. Lovena Le there are occasional PVC triplets  noted.  In review of patient's previous Holter monitoring data is reported he has up to 20% PVCs in the past as well.  Patient also reports no palpitations ____________________________________________  RADIOLOGY  Dg Chest 2 View  Result Date: 01/03/2019 CLINICAL DATA:  Chest pain for several months. EXAM: CHEST - 2 VIEW COMPARISON:  None. FINDINGS: The heart size and mediastinal contours are stable. Both lungs are clear. The visualized skeletal structures are unremarkable. IMPRESSION: No active cardiopulmonary disease. Electronically Signed   By: Abelardo Diesel M.D.   On: 01/03/2019 19:05   Chest x-ray is reviewed by me and negative for acute ____________________________________________   PROCEDURES  Procedure(s) performed: None  Procedures  Critical Care performed: No  ____________________________________________   INITIAL IMPRESSION / ASSESSMENT AND PLAN / ED COURSE  Pertinent labs & imaging results that were available during my care of the patient were reviewed by me and considered in my medical decision making (see chart for details).   Differential diagnosis includes, but is not limited to, ACS, aortic dissection, pulmonary embolism, cardiac tamponade, pneumothorax, pneumonia, pericarditis, myocarditis, GI-related causes including esophagitis/gastritis, and musculoskeletal chest wall pain.    Patient symptoms and examination concerning for unstable angina, no evidence to suggest ischemia from blood work at this time and no STEMI.  No ripping tearing or moving pain, this sounds like an exacerbation of his chronic angina, which to me suggest the diagnosis is likely unstable angina.  No signs or symptoms suggest pulmonary embolism, dissection, pneumothorax, or intra-abdominal etiology at this time.  ----------------------------------------- 8:57 PM on 01/03/2019 -----------------------------------------  Case and care discussed with cardiology Dr. Lovena Le.  Recommends oral  metoprolol and nitro paste for now.  See [facility] heart care will see patient in consultation tomorrow, agreeable with admission.  Close monitoring for cardiac rule out.  Patient is agreeable with plan.  Does report he is feeling improved but still having some residual chest pressure at this time, overall feels much better than when he first came in      ____________________________________________   FINAL CLINICAL IMPRESSION(S) / ED DIAGNOSES  Final diagnoses:  Unstable angina pectoris due to coronary arteriosclerosis [facility]        Note:  This document was prepared using Dragon voice recognition software and may include unintentional dictation  errors       Delman Kitten, MD 01/03/19 2101

## 2019-01-03 NOTE — ED Notes (Signed)
Pt denies any chest pain or shortness of breath at this time. This RN will continue to monitor.

## 2019-01-03 NOTE — ED Triage Notes (Signed)
Pt arrives to ed via POV, Pt c/o chest pain on and off over the several months. Pain increased around 5 pm, pt states pain was unbearable. C/o nausea, no vomiting at this time. Pt appears pale. A&o x 4, NAD noted at this time.

## 2019-01-04 ENCOUNTER — Other Ambulatory Visit: Payer: Self-pay

## 2019-01-04 DIAGNOSIS — I119 Hypertensive heart disease without heart failure: Secondary | ICD-10-CM

## 2019-01-04 DIAGNOSIS — I25118 Atherosclerotic heart disease of native coronary artery with other forms of angina pectoris: Secondary | ICD-10-CM

## 2019-01-04 DIAGNOSIS — R079 Chest pain, unspecified: Secondary | ICD-10-CM

## 2019-01-04 DIAGNOSIS — E782 Mixed hyperlipidemia: Secondary | ICD-10-CM

## 2019-01-04 DIAGNOSIS — I2510 Atherosclerotic heart disease of native coronary artery without angina pectoris: Secondary | ICD-10-CM | POA: Diagnosis not present

## 2019-01-04 DIAGNOSIS — R739 Hyperglycemia, unspecified: Secondary | ICD-10-CM | POA: Diagnosis not present

## 2019-01-04 DIAGNOSIS — K219 Gastro-esophageal reflux disease without esophagitis: Secondary | ICD-10-CM

## 2019-01-04 LAB — TROPONIN I
Troponin I: <0.03 ng/mL (ref <0.03)
Troponin I: <0.03 ng/mL (ref <0.03)

## 2019-01-04 MED ORDER — CLOPIDOGREL BISULFATE 75 MG PO TABS
75.0000 mg | ORAL_TABLET | Freq: Every day | ORAL | Status: DC
  Administered 2019-01-04 – 2019-01-05 (×2): 75 mg via ORAL
  Filled 2019-01-04 (×2): qty 1

## 2019-01-04 MED ORDER — ONDANSETRON HCL 4 MG/2ML IJ SOLN
4.0000 mg | Freq: Four times a day (QID) | INTRAMUSCULAR | Status: DC | PRN
  Administered 2019-01-04: 4 mg via INTRAVENOUS
  Filled 2019-01-04: qty 2

## 2019-01-04 MED ORDER — ACETAMINOPHEN 325 MG PO TABS: 650.0000 mg | ORAL_TABLET | ORAL | Status: DC | PRN

## 2019-01-04 MED ORDER — ENOXAPARIN SODIUM 40 MG/0.4ML ~~LOC~~ SOLN: 40.0000 mg | SUBCUTANEOUS | Status: DC

## 2019-01-04 MED ORDER — LOSARTAN POTASSIUM 25 MG PO TABS
25.0000 mg | ORAL_TABLET | Freq: Every day | ORAL | Status: DC
  Administered 2019-01-04 – 2019-01-05 (×2): 25 mg via ORAL
  Filled 2019-01-04 (×2): qty 1

## 2019-01-04 MED ORDER — FAMOTIDINE 20 MG PO TABS
20.0000 mg | ORAL_TABLET | Freq: Every day | ORAL | Status: DC
  Administered 2019-01-04: 20 mg via ORAL
  Filled 2019-01-04: qty 1

## 2019-01-04 MED ORDER — PANTOPRAZOLE SODIUM 40 MG PO TBEC
40.0000 mg | DELAYED_RELEASE_TABLET | Freq: Every day | ORAL | Status: DC
  Administered 2019-01-04: 40 mg via ORAL
  Filled 2019-01-04: qty 1

## 2019-01-04 MED ORDER — RANOLAZINE ER 500 MG PO TB12
500.0000 mg | ORAL_TABLET | Freq: Two times a day (BID) | ORAL | Status: DC
  Administered 2019-01-04: 500 mg via ORAL
  Filled 2019-01-04 (×2): qty 1

## 2019-01-04 MED ORDER — BISACODYL 5 MG PO TBEC: 5.0000 mg | DELAYED_RELEASE_TABLET | Freq: Every day | ORAL | Status: DC | PRN

## 2019-01-04 MED ORDER — METOPROLOL SUCCINATE ER 50 MG PO TB24
150.0000 mg | ORAL_TABLET | Freq: Every day | ORAL | Status: DC
  Administered 2019-01-05: 150 mg via ORAL
  Filled 2019-01-04: qty 3
  Filled 2019-01-04: qty 1

## 2019-01-04 MED ORDER — ALUM & MAG HYDROXIDE-SIMETH 200-200-20 MG/5ML PO SUSP: 30.0000 mL | Freq: Once | ORAL | Status: DC

## 2019-01-04 MED ORDER — ISOSORBIDE MONONITRATE ER 30 MG PO TB24
30.0000 mg | ORAL_TABLET | Freq: Every day | ORAL | Status: DC
  Administered 2019-01-04 – 2019-01-05 (×2): 30 mg via ORAL
  Filled 2019-01-04 (×2): qty 1

## 2019-01-04 MED ORDER — ALUM & MAG HYDROXIDE-SIMETH 200-200-20 MG/5ML PO SUSP: 30.0000 mL | ORAL | Status: DC | PRN

## 2019-01-04 MED ORDER — RANOLAZINE ER 500 MG PO TB12
500.0000 mg | ORAL_TABLET | Freq: Two times a day (BID) | ORAL | Status: DC
  Administered 2019-01-04 – 2019-01-05 (×3): 500 mg via ORAL
  Filled 2019-01-04 (×4): qty 1

## 2019-01-04 MED ORDER — SENNOSIDES-DOCUSATE SODIUM 8.6-50 MG PO TABS: 1.0000 | ORAL_TABLET | Freq: Every evening | ORAL | Status: DC | PRN

## 2019-01-04 MED ORDER — LIDOCAINE VISCOUS HCL 2 % MT SOLN: 15.0000 mL | Freq: Once | OROMUCOSAL | Status: DC

## 2019-01-04 MED ORDER — PANTOPRAZOLE SODIUM 40 MG PO TBEC
40.0000 mg | DELAYED_RELEASE_TABLET | Freq: Two times a day (BID) | ORAL | Status: DC
  Administered 2019-01-04 – 2019-01-05 (×3): 40 mg via ORAL
  Filled 2019-01-04 (×3): qty 1

## 2019-01-04 MED ORDER — LIDOCAINE VISCOUS HCL 2 % MT SOLN
15.0000 mL | Freq: Once | OROMUCOSAL | Status: DC
  Filled 2019-01-04: qty 15

## 2019-01-04 NOTE — ED Notes (Signed)
Nitropaste removed per MD Posey Pronto.

## 2019-01-04 NOTE — ED Notes (Signed)
ED TO INPATIENT HANDOFF REPORT  Name/Age/Gender Chase Scott 63 y.o. male  Code Status    Code Status Orders  (From admission, onward)         Start     Ordered   01/04/19 0231  Full code  Continuous     01/04/19 0230        Code Status History    Date Active Date Inactive Code Status Order ID Comments User Context   12/12/2017 1548 12/14/2017 1907 Full Code [id number]  SalaryAvel Peace, MD Inpatient      Home/SNF/Other home  Chief Complaint Chest Pain; Nausea  Level of Care/Admitting Diagnosis ED Disposition    ED Disposition Condition [place]: [place] [[id number]]  Level of Care: Telemetry [5]  Diagnosis: Chest pain [027741]  Admitting Physician: Arta Silence [[id number]]  Attending Physician: Arta Silence [[id number]]  PT Class (Do Not Modify): Observation [104]  PT Acc Code (Do Not Modify): Observation [10022]       Medical History Past Medical History:  Diagnosis Date  . Alcoholism ([place])   . Anginal pain ([place])   . CAD (coronary artery disease)    a. 01/2003 s/p PCI to RCA;  b. 02/2003 s/ CABG x 4 (LIMA->LAD, RIMA->RCA, VG->Diag, VG->LCX; c. 2005 s/p PCI to OM2; d. 06/2012 Cath: LM nl, LAD 173m, LCX mild plaque, OM1 min irregs, OM2 patent stent, RCA 63m ISR, VG->LCX 100, VG->Diag ok, RIMA->RCA 100, LIMA->LAD ok, EF 50-55%-->Med Rx.  . Chronic Chest pain   . Diastolic dysfunction    a. 11/2017 Echo: EF 50-55%, no rwma, Gr1 DD, mildly dil LA.  Marland Kitchen Dyslipidemia   . Gastroesophageal reflux disease   . Gastrointestinal symptoms    Chronic gastrointestinal symptoms  . History of kidney stones   . History of left shoulder fracture   . Hyperlipidemia   . Hypertension   . Hypertensive cardiovascular disease    a. Labile BPs.  . Rotator cuff syndrome     Allergies Allergies  Allergen Reactions  . Penicillins Anaphylaxis  . Sulfa Antibiotics Anaphylaxis  . Etodolac   . Lipitor [Atorvastatin Calcium]     Myalgias @ 80 mg  . Paxil [Paroxetine Hcl]   . Statins     Myalgias on high dose lipitor and moderate dose crestor.  . Sulfasalazine     IV Location/Drains/Wounds Patient Lines/Drains/Airways Status   Active Line/Drains/Airways    Name:   Placement date:   Placement time:   Site:   Days:   Peripheral IV 01/03/19 Right Antecubital   01/03/19    1836    Antecubital   1   Incision 07/02/12 Groin Right   07/02/12    1000     2377          Labs/Imaging Results for orders placed or performed during the hospital encounter of 01/03/19 (from the past 48 hour(s))  Basic metabolic panel     Status: Abnormal   Collection Time: 01/03/19  6:34 PM  Result Value Ref Range   Sodium 136 135 - 145 mmol/L   Potassium 3.5 3.5 - 5.1 mmol/L   Chloride 103 98 - 111 mmol/L   CO2 27 22 - 32 mmol/L   Glucose, Bld 145 (H) 70 - 99 mg/dL   BUN 14 8 - 23 mg/dL   Creatinine, Ser 1.05 0.61 - 1.24 mg/dL   Calcium 8.8 (L) 8.9 - 10.3 mg/dL   GFR calc non Af Amer >60 >60  mL/min   GFR calc Af Amer >60 >60 mL/min   Anion gap 6 5 - 15    Comment: Performed at [facility], [address] [place], [place] [address]  CBC     Status: None   Collection Time: 01/03/19  6:34 PM  Result Value Ref Range   WBC 9.3 4.0 - 10.5 K/uL   RBC 4.86 4.22 - 5.81 MIL/uL   Hemoglobin 15.1 13.0 - 17.0 g/dL   HCT 43.1 39.0 - 52.0 %   MCV 88.7 80.0 - 100.0 fL   MCH 31.1 26.0 - 34.0 pg   MCHC 35.0 30.0 - 36.0 g/dL   RDW 12.0 11.5 - 15.5 %   Platelets 243 150 - 400 K/uL   nRBC 0.0 0.0 - 0.2 %    Comment: Performed at [facility], [address] [place], [place] [address]  Troponin I - ONCE - STAT     Status: None   Collection Time: 01/03/19  6:34 PM  Result Value Ref Range   Troponin I <0.03 <0.03 ng/mL    Comment: Performed at [facility], [address] [place], [place] [address]  Hepatic function panel     Status: None   Collection Time: 01/03/19  6:34 PM  Result Value Ref Range   Total  Protein 6.9 6.5 - 8.1 g/dL   Albumin 4.3 3.5 - 5.0 g/dL   AST 22 15 - 41 U/L   ALT 19 0 - 44 U/L   Alkaline Phosphatase 47 38 - 126 U/L   Total Bilirubin 0.8 0.3 - 1.2 mg/dL   Bilirubin, Direct 0.2 0.0 - 0.2 mg/dL   Indirect Bilirubin 0.6 0.3 - 0.9 mg/dL    Comment: Performed at [facility], [address] [place], [place] [address]  Lipase, blood     Status: None   Collection Time: 01/03/19  6:34 PM  Result Value Ref Range   Lipase 25 11 - 51 U/L    Comment: Performed at [facility], [address] [place], [place] [address]  Troponin I - Once     Status: None   Collection Time: 01/03/19 11:58 PM  Result Value Ref Range   Troponin I <0.03 <0.03 ng/mL    Comment: Performed at [facility], [address] [place],  [address]  Troponin I - Tomorrow AM 0500     Status: None   Collection Time: 01/04/19  6:33 AM  Result Value Ref Range   Troponin I <0.03 <0.03 ng/mL    Comment: Performed at [facility], [address], [place],  [address]   Dg Chest 2 View  Result Date: 01/03/2019 CLINICAL DATA:  Chest pain for several months. EXAM: CHEST - 2 VIEW COMPARISON:  None. FINDINGS: The heart size and mediastinal contours are stable. Both lungs are clear. The visualized skeletal structures are unremarkable. IMPRESSION: No active cardiopulmonary disease. Electronically Signed   By: Abelardo Diesel M.D.   On: 01/03/2019 19:05    Pending Labs Unresulted Labs (From admission, onward)    Start     Ordered   01/04/19 0231  HIV antibody (Routine Testing)  Once,   STAT     01/04/19 0230          Vitals/Pain Today's Vitals   01/04/19 0945 01/04/19 0949 01/04/19 1027 01/04/19 1101  BP: 104/61 104/61  101/68  Pulse: 62 66  61  Resp: (!) 22   12  Temp:  TempSrc:      SpO2: 96%   95%  Weight:      Height:      PainSc:   0-No pain     Isolation Precautions No active isolations  Medications Medications  aspirin chewable  tablet 81 mg (81 mg Oral Given 01/04/19 0942)  morphine 2 MG/ML injection 2-4 mg (2 mg Intravenous Given 01/04/19 0401)  nitroGLYCERIN (NITROSTAT) SL tablet 0.4 mg (has no administration in time range)  acetaminophen (TYLENOL) tablet 650 mg (has no administration in time range)  ondansetron (ZOFRAN) injection 4 mg (4 mg Intravenous Given 01/04/19 0401)  enoxaparin (LOVENOX) injection 40 mg (40 mg Subcutaneous Not Given 01/04/19 0359)  senna-docusate (Senokot-S) tablet 1 tablet (has no administration in time range)  bisacodyl (DULCOLAX) EC tablet 5 mg (has no administration in time range)  clopidogrel (PLAVIX) tablet 75 mg (75 mg Oral Given 01/04/19 0942)  isosorbide mononitrate (IMDUR) 24 hr tablet 30 mg (30 mg Oral Given 01/04/19 0942)  losartan (COZAAR) tablet 25 mg (25 mg Oral Given 01/04/19 0941)  pantoprazole (PROTONIX) EC tablet 40 mg (40 mg Oral Given 01/04/19 0941)  metoprolol succinate (TOPROL-XL) 24 hr tablet 150 mg (0 mg Oral Hold 01/04/19 0949)  ranolazine (RANEXA) 12 hr tablet 500 mg (500 mg Oral Given 01/04/19 1102)  aspirin chewable tablet 324 mg (324 mg Oral Given 01/03/19 2058)  metoprolol tartrate (LOPRESSOR) tablet 25 mg (25 mg Oral Given 01/03/19 2107)  nitroGLYCERIN (NITROGLYN) 2 % ointment 0.5 inch (0.5 inches Topical Given 01/03/19 2107)    Mobility Independent

## 2019-01-04 NOTE — ED Notes (Signed)
Pt reports that he is in no discomfort at this time.

## 2019-01-04 NOTE — ED Notes (Signed)
Breakfast given at this time.

## 2019-01-04 NOTE — H&P (Signed)
[facility] NAME: Chase Scott    MR#:  [id number]  DATE OF BIRTH:  [birth year]  DATE OF ADMISSION:  01/03/2019  PRIMARY CARE PHYSICIAN: Baxter Hire, MD   REQUESTING/REFERRING PHYSICIAN: Delman Kitten, MD  CHIEF COMPLAINT:   Chief Complaint  Patient presents with  . Chest Pain    HISTORY OF PRESENT ILLNESS:  Chase Scott  is a 63 y.o. male with a known history of CAD/MI (s/p CABG, stent) p/w CP. On Ranexa and Imdur ~57mo per pt. Fri 02/14, pt sitting on boat tying lures (no exertion) when chest pressure (chronic) worsened. States it became worse gradually over the day, severe by 5PM. Pt's wife states he was pale, nauseous. (-) diaphoresis. States he has Hx of PVCs. Sees Dr. Fletcher Anon outpt (cards).  PAST MEDICAL HISTORY:   Past Medical History:  Diagnosis Date  . Alcoholism ([place])   . Anginal pain ([place])   . CAD (coronary artery disease)    a. 01/2003 s/p PCI to RCA;  b. 02/2003 s/ CABG x 4 (LIMA->LAD, RIMA->RCA, VG->Diag, VG->LCX; c. 2005 s/p PCI to OM2; d. 06/2012 Cath: LM nl, LAD 131m, LCX mild plaque, OM1 min irregs, OM2 patent stent, RCA 48m ISR, VG->LCX 100, VG->Diag ok, RIMA->RCA 100, LIMA->LAD ok, EF 50-55%-->Med Rx.  . Chronic Chest pain   . Diastolic dysfunction    a. 11/2017 Echo: EF 50-55%, no rwma, Gr1 DD, mildly dil LA.  Marland Kitchen Dyslipidemia   . Gastroesophageal reflux disease   . Gastrointestinal symptoms    Chronic gastrointestinal symptoms  . History of kidney stones   . History of left shoulder fracture   . Hyperlipidemia   . Hypertension   . Hypertensive cardiovascular disease    a. Labile BPs.  . Rotator cuff syndrome     PAST SURGICAL HISTORY:   Past Surgical History:  Procedure Laterality Date  . Arthroscopic Knee Surgery Right 2009  . Arthroscopic Knee Surgery Left 10/2017  . BACK SURGERY     Lower  . CARDIAC CATHETERIZATION    . COLONOSCOPY WITH PROPOFOL N/A 04/29/2018   Procedure: COLONOSCOPY WITH  PROPOFOL;  Surgeon: Manya Silvas, MD;  Location: [facility] ENDOSCOPY;  Service: Endoscopy;  Laterality: N/A;  . CORONARY ARTERY BYPASS GRAFT  April 2004   x four  . ESOPHAGOGASTRODUODENOSCOPY (EGD) WITH PROPOFOL N/A 04/29/2018   Procedure: ESOPHAGOGASTRODUODENOSCOPY (EGD) WITH PROPOFOL;  Surgeon: Manya Silvas, MD;  Location[facility] ENDOSCOPY;  Service: Endoscopy;  Laterality: N/A;  . LEFT HEART CATH AND CORONARY ANGIOGRAPHY N/A 12/14/2017   Procedure: LEFT HEART CATH AND CORONARY ANGIOGRAPHY;  Surgeon: Wellington Hampshire, MD;  Location: [facility] CV LAB;  Service: Cardiovascular;  Laterality: N/A;  . LEFT HEART CATHETERIZATION WITH CORONARY/GRAFT ANGIOGRAM  07/02/2012   Procedure: LEFT HEART CATHETERIZATION WITH Beatrix Fetters;  Surgeon: Burnell Blanks, MD;  Location: [facility] CATH LAB;  Service: Cardiovascular;;  . TONSILLECTOMY      SOCIAL HISTORY:   Social History   Tobacco Use  . Smoking status: Former Smoker    Packs/day: 1.00    Years: 15.00    Pack years: 15.00    Types: Cigarettes    Last attempt to quit: 12/13/2001    Years since quitting: 17.0  . Smokeless tobacco: Never Used  Substance Use Topics  . Alcohol use: No    Frequency: Never    FAMILY HISTORY:   Family History  Problem Relation Age of Onset  . Other Mother        - "  back problems and breathing problems."  . Other Father        Died @ age 52 - pt doesn't know father's medical history.  . Microcephaly Sister   . Heart attack Sister     DRUG ALLERGIES:   Allergies  Allergen Reactions  . Penicillins Anaphylaxis  . Sulfa Antibiotics Anaphylaxis  . Etodolac   . Lipitor [Atorvastatin Calcium]     Myalgias @ 80 mg  . Paxil [Paroxetine Hcl]   . Statins     Myalgias on high dose lipitor and moderate dose crestor.  . Sulfasalazine     REVIEW OF SYSTEMS:   Review of Systems  Constitutional: Negative for chills, diaphoresis, fever, malaise/fatigue and weight loss.  HENT: Negative for  congestion, ear discharge, ear pain, hearing loss, sinus pain, sore throat and tinnitus.   Eyes: Negative for blurred vision, double vision and photophobia.  Respiratory: Negative for cough, hemoptysis, sputum production, shortness of breath and wheezing.   Cardiovascular: Positive for chest pain. Negative for palpitations, orthopnea, claudication, leg swelling and PND.  Gastrointestinal: Positive for nausea. Negative for abdominal pain, blood in stool, constipation, diarrhea, heartburn, melena and vomiting.  Genitourinary: Negative for dysuria, frequency, hematuria and urgency.  Musculoskeletal: Negative for back pain, joint pain, myalgias and neck pain.  Skin: Negative for itching and rash.  Neurological: Negative for dizziness, tingling, tremors, sensory change, speech change, focal weakness, seizures, loss of consciousness, weakness and headaches.  Psychiatric/Behavioral: Negative for depression and memory loss. The patient is not nervous/anxious and does not have insomnia.    MEDICATIONS AT HOME:   Prior to Admission medications   Medication Sig Start Date End Date Taking? Authorizing Provider  aspirin EC 81 MG EC tablet Take 1 tablet (81 mg total) by mouth daily. 12/15/17  Yes Salary, Avel Peace, MD  clopidogrel (PLAVIX) 75 MG tablet Take 1 tablet (75 mg total) by mouth daily. 08/23/18  Yes Wellington Hampshire, MD  isosorbide mononitrate (IMDUR) 30 MG 24 hr tablet Take 1 tablet (30 mg total) by mouth daily. 11/01/18  Yes Wellington Hampshire, MD  losartan (COZAAR) 25 MG tablet Take 1 tablet (25 mg total) by mouth daily. 11/01/18  Yes Wellington Hampshire, MD  pantoprazole (PROTONIX) 40 MG tablet Take 1 tablet (40 mg total) by mouth daily. 06/28/18  Yes Wellington Hampshire, MD  ranolazine (RANEXA) 500 MG 12 hr tablet Take 1 tablet (500 mg total) by mouth 2 (two) times daily. 12/03/18  Yes Wellington Hampshire, MD  REPATHA SURECLICK 160 MG/ML SOAJ Inject 140 mg into the skin every 14 (fourteen) days. 10/14/18   Yes Wellington Hampshire, MD  sucralfate (CARAFATE) 1 g tablet Take 1 g by mouth as needed.    Yes [provider]  TOPROL XL 100 MG 24 hr tablet Take 150 mg by mouth daily. 10/30/17  Yes [provider]      VITAL SIGNS:  Blood pressure 123/86, pulse (!) 55, temperature 97.8 F (36.6 C), temperature source Oral, resp. rate (!) 9, height 5\' 9"  (1.753 m), weight 104.3 kg, SpO2 97 %.  PHYSICAL EXAMINATION:  Physical Exam Constitutional:      General: He is not in acute distress.    Appearance: He is not ill-appearing, toxic-appearing or diaphoretic.     Interventions: He is not intubated. HENT:     Head: Atraumatic.     Mouth/Throat:     Pharynx: Oropharynx is clear.  Eyes:     General: No scleral icterus.  Extraocular Movements: Extraocular movements intact.     Conjunctiva/sclera: Conjunctivae normal.  Neck:     Musculoskeletal: Neck supple.  Cardiovascular:     Rate and Rhythm: Normal rate and regular rhythm. Occasional extrasystoles are present.    Heart sounds: S1 normal and S2 normal. Heart sounds not distant. No murmur. No systolic murmur. No diastolic murmur. No friction rub. No gallop. No S3 or S4 sounds.   Pulmonary:     Effort: No tachypnea, bradypnea, accessory muscle usage, respiratory distress or retractions. He is not intubated.     Breath sounds: Normal breath sounds. No stridor. No decreased breath sounds, wheezing, rhonchi or rales.  Abdominal:     General: Bowel sounds are normal. There is no distension.     Palpations: Abdomen is soft.     Tenderness: There is no abdominal tenderness. There is no guarding or rebound.  Musculoskeletal: Normal range of motion.        General: No swelling or tenderness.  Lymphadenopathy:     Cervical: No cervical adenopathy.  Skin:    General: Skin is warm and dry.     Findings: No erythema or rash.  Neurological:     Mental Status: He is alert. Mental status is at baseline.  Psychiatric:        Mood and  Affect: Mood normal.        Behavior: Behavior normal.        Thought Content: Thought content normal.        Judgment: Judgment normal.    LABORATORY PANEL:   CBC Recent Labs  Lab 01/03/19 1834  WBC 9.3  HGB 15.1  HCT 43.1  PLT 243   ------------------------------------------------------------------------------------------------------------------  Chemistries  Recent Labs  Lab 01/03/19 1834  NA 136  K 3.5  CL 103  CO2 27  GLUCOSE 145*  BUN 14  CREATININE 1.05  CALCIUM 8.8*  AST 22  ALT 19  ALKPHOS 47  BILITOT 0.8   ------------------------------------------------------------------------------------------------------------------  Cardiac Enzymes Recent Labs  Lab 01/03/19 2358  TROPONINI <0.03   ------------------------------------------------------------------------------------------------------------------  RADIOLOGY:  Dg Chest 2 View  Result Date: 01/03/2019 CLINICAL DATA:  Chest pain for several months. EXAM: CHEST - 2 VIEW COMPARISON:  None. FINDINGS: The heart size and mediastinal contours are stable. Both lungs are clear. The visualized skeletal structures are unremarkable. IMPRESSION: No active cardiopulmonary disease. Electronically Signed   By: Abelardo Diesel M.D.   On: 01/03/2019 19:05   IMPRESSION AND PLAN:   A/P: 59M CAD/MI (s/p CABG, stent) p/w CP. Hyperglycemia, hypocalcemia. -CP, CAD/MI: EKG (-) acute ST chg. Trop (-). On Plavix at home but says he has not been taking ASA. c/w cardiac meds (Plavix, Imdur, ARB, beta blocker, Ranexa). Morphine, NTG, O2. Cardiac monitoring, pulse ox. Cardiology consult (sees Dr. Fletcher Anon outpt). -Hypocalcemia: Ionized calcium. -c/w other home meds/formulary subs as tolerated. -FEN/GI: Cardiac diet. -DVT PPx: Lovenox. -Code status: Full code. -Disposition: Observation, < 2 midnights.   All the records are reviewed and case discussed with ED provider. Management plans discussed with the patient, family and they  are in agreement.  CODE STATUS: Full code.  TOTAL TIME TAKING CARE OF THIS PATIENT: 75 minutes.    Arta Silence M.D on 01/04/2019 at 2:08 AM  Between 7am to 6pm - Pager - [phone]  After 6pm go to [email or web address] - password [email or web address] [facility]  [facility] [place] [facility]  Office  [phone]  CC: Primary care physician; Baxter Hire, MD   Note: This dictation was prepared  with Dragon dictation along with smaller phrase technology. Any transcriptional errors that result from this process are unintentional.

## 2019-01-04 NOTE — Consult Note (Signed)
Cardiology Consultation:   Patient ID: Chase Scott MRN: [id number]; DOB: June 04, 1956  Admit date: 01/03/2019 Date of Consult: 01/04/2019  Primary Care Provider: Baxter Hire, MD Primary Cardiologist: [name], MD  Physician requesting consult: Dr. Jodell Cipro Reason for consult: chest pain   Patient Profile:   Chase Scott is a 63 y.o. male with a hx of former smoker , coronary artery disease, bypass surgery, prior stenting who presents with severe chest pain  status post CABG in 2004 with subsequent PCI of left circumflex and right coronary artery.  chronic diastolic heart failure, hypertension and hyperlipidemia.  hospitalized in January, 2019 with exertional chest tightness.   EKG showed possible old inferior infarct.  He ruled out for myocardial infarction by enzymes.    nuclear stress test which showed evidence of prior infarct in the basal anterolateral and mid anterolateral location without significant reversibility.  EF was 43%.  The study was intermediate risk.    Echocardiogram showed an EF of 50% with grade 1 diastolic dysfunction.  Cardiac catheterization showed significant underlying three-vessel coronary artery disease with patent stents in the left circumflex and right coronary arteries.  There was patent LIMA to LAD and SVG to diagonal.  SVG to left circumflex was chronically occluded with atretic RIMA to RCA.  There was moderate ostial RCA stenosis with catheter-induced spasm that improved with nitroglycerin.  Left ventricular end-diastolic pressure was normal. He was treated medically.      History of Present Illness:   [name] reports long history of chronic chest pain Sometimes comes on with rest  Symptoms date back several years, seems to respond to nitro, GI cocktail, Mylanta, drinking carbonated sodas,  Describes it as a squeezing sensation in his chest Perhaps becoming more intense but same frequency over the past several years Had episode  last week was 10/10 did not come to the emergency room Drank a Sprite took a nap and symptoms went away  Symptoms again yesterday 10/10 described as squeezing in his chest, coming on at rest In the emergency room had nitro paste and morphine and symptoms went away  These chest pain symptoms seem to do better in the past with Nexium. Does not have any nitro with him at home  Periodically will take Mylanta when symptoms seem to improve but then will recur  Reports having previous EGDs for similar symptoms Last EGD with Dr. Vira Agar June 2019 This showed striped mildly erythematous mucosa without bleeding gastric antrum Normal esophagus and duodenum Biopsies negative  This admission cardiac enzymes negative, EKG unchanged Currently feels a fullness in his chest after eating lunch   Past Medical History:  Diagnosis Date  . Alcoholism ([place])   . Anginal pain ([place])   . CAD (coronary artery disease)    a. 01/2003 s/p PCI to RCA;  b. 02/2003 s/ CABG x 4 (LIMA->LAD, RIMA->RCA, VG->Diag, VG->LCX; c. 2005 s/p PCI to OM2; d. 06/2012 Cath: LM nl, LAD 166m, LCX mild plaque, OM1 min irregs, OM2 patent stent, RCA 32m ISR, VG->LCX 100, VG->Diag ok, RIMA->RCA 100, LIMA->LAD ok, EF 50-55%-->Med Rx.  . Chronic Chest pain   . Diastolic dysfunction    a. 11/2017 Echo: EF 50-55%, no rwma, Gr1 DD, mildly dil LA.  Marland Kitchen Dyslipidemia   . Gastroesophageal reflux disease   . Gastrointestinal symptoms    Chronic gastrointestinal symptoms  . History of kidney stones   . History of left shoulder fracture   . Hyperlipidemia   . Hypertension   . Hypertensive  cardiovascular disease    a. Labile BPs.  . Rotator cuff syndrome     Past Surgical History:  Procedure Laterality Date  . Arthroscopic Knee Surgery Right 2009  . Arthroscopic Knee Surgery Left 10/2017  . BACK SURGERY     Lower  . CARDIAC CATHETERIZATION    . COLONOSCOPY WITH PROPOFOL N/A 04/29/2018   Procedure: COLONOSCOPY WITH PROPOFOL;  Surgeon:  Manya Silvas, MD;  Location: [facility] ENDOSCOPY;  Service: Endoscopy;  Laterality: N/A;  . CORONARY ARTERY BYPASS GRAFT  April 2004   x four  . ESOPHAGOGASTRODUODENOSCOPY (EGD) WITH PROPOFOL N/A 04/29/2018   Procedure: ESOPHAGOGASTRODUODENOSCOPY (EGD) WITH PROPOFOL;  Surgeon: Manya Silvas, MD;  Location: [facility] ENDOSCOPY;  Service: Endoscopy;  Laterality: N/A;  . LEFT HEART CATH AND CORONARY ANGIOGRAPHY N/A 12/14/2017   Procedure: LEFT HEART CATH AND CORONARY ANGIOGRAPHY;  Surgeon: Wellington Hampshire, MD;  Location: [facility] LAB;  Service: Cardiovascular;  Laterality: N/A;  . LEFT HEART CATHETERIZATION WITH CORONARY/GRAFT ANGIOGRAM  07/02/2012   Procedure: LEFT HEART CATHETERIZATION WITH Beatrix Fetters;  Surgeon: Burnell Blanks, MD;  Location: [facility] CATH LAB;  Service: Cardiovascular;;  . TONSILLECTOMY       Home Medications:  Prior to Admission medications   Medication Sig Start Date End Date Taking? Authorizing Provider  aspirin EC 81 MG EC tablet Take 1 tablet (81 mg total) by mouth daily. 12/15/17  Yes Salary, Avel Peace, MD  clopidogrel (PLAVIX) 75 MG tablet Take 1 tablet (75 mg total) by mouth daily. 08/23/18  Yes Wellington Hampshire, MD  isosorbide mononitrate (IMDUR) 30 MG 24 hr tablet Take 1 tablet (30 mg total) by mouth daily. 11/01/18  Yes Wellington Hampshire, MD  losartan (COZAAR) 25 MG tablet Take 1 tablet (25 mg total) by mouth daily. 11/01/18  Yes Wellington Hampshire, MD  pantoprazole (PROTONIX) 40 MG tablet Take 1 tablet (40 mg total) by mouth daily. 06/28/18  Yes Wellington Hampshire, MD  ranolazine (RANEXA) 500 MG 12 hr tablet Take 1 tablet (500 mg total) by mouth 2 (two) times daily. 12/03/18  Yes Wellington Hampshire, MD  REPATHA SURECLICK 856 MG/ML SOAJ Inject 140 mg into the skin every 14 (fourteen) days. 10/14/18  Yes Wellington Hampshire, MD  sucralfate (CARAFATE) 1 g tablet Take 1 g by mouth as needed.    Yes [provider]  TOPROL XL 100 MG 24 hr tablet  Take 150 mg by mouth daily. 10/30/17  Yes [provider]    Inpatient Medications: Scheduled Meds: . aspirin  81 mg Oral Daily  . clopidogrel  75 mg Oral Daily  . enoxaparin (LOVENOX) injection  40 mg Subcutaneous Q24H  . famotidine  20 mg Oral QHS  . isosorbide mononitrate  30 mg Oral Daily  . lidocaine  15 mL Oral Once  . losartan  25 mg Oral Daily  . metoprolol succinate  150 mg Oral Daily  . pantoprazole  40 mg Oral BID AC  . ranolazine  500 mg Oral BID   Continuous Infusions:  PRN Meds: acetaminophen, alum & mag hydroxide-simeth **AND** lidocaine, bisacodyl, morphine injection, nitroGLYCERIN, ondansetron (ZOFRAN) IV, senna-docusate  Allergies:    Allergies  Allergen Reactions  . Penicillins Anaphylaxis  . Sulfa Antibiotics Anaphylaxis  . Etodolac   . Lipitor [Atorvastatin Calcium]     Myalgias @ 80 mg  . Paxil [Paroxetine Hcl]   . Statins     Myalgias on high dose lipitor and moderate dose crestor.  Marland Kitchen  Sulfasalazine     Social History:   Social History   Socioeconomic History  . Marital status: Married    Spouse name: Not on file  . Number of children: Not on file  . Years of education: Not on file  . Highest education level: Not on file  Occupational History  . Occupation: "Maintenance workCatering manager  . Financial resource strain: Not on file  . Food insecurity:    Worry: Not on file    Inability: Not on file  . Transportation needs:    Medical: Not on file    Non-medical: Not on file  Tobacco Use  . Smoking status: Former Smoker    Packs/day: 1.00    Years: 15.00    Pack years: 15.00    Types: Cigarettes    Last attempt to quit: 12/13/2001    Years since quitting: 17.0  . Smokeless tobacco: Never Used  Substance and Sexual Activity  . Alcohol use: No    Frequency: Never  . Drug use: No  . Sexual activity: Not on file  Lifestyle  . Physical activity:    Days per week: Not on file    Minutes per session: Not on file  . Stress:  Not on file  Relationships  . Social connections:    Talks on phone: Not on file    Gets together: Not on file    Attends religious service: Not on file    Active member of club or organization: Not on file    Attends meetings of clubs or organizations: Not on file    Relationship status: Not on file  . Intimate partner violence:    Fear of current or ex partner: Not on file    Emotionally abused: Not on file    Physically abused: Not on file    Forced sexual activity: Not on file  Other Topics Concern  . Not on file  Social History Narrative   Lives in [place] with wife.  Works in Maintenance.    Family History:    Family History  Problem Relation Age of Onset  . Other Mother        - "back problems and breathing problems."  . Other Father        Died @ age 14 - pt doesn't know father's medical history.  . Microcephaly Sister   . Heart attack Sister      ROS:  Please see the history of present illness.  Review of Systems  Constitutional: Negative.   Respiratory: Negative.   Cardiovascular: Positive for chest pain.  Gastrointestinal: Negative.   Musculoskeletal: Negative.   Neurological: Negative.   Psychiatric/Behavioral: Negative.   All other systems reviewed and are negative.   Physical Exam/Data:   Vitals:   01/04/19 0945 01/04/19 0949 01/04/19 1101 01/04/19 1158  BP: 104/61 104/61 101/68 (!) 154/87  Pulse: 62 66 61 67  Resp: (!) 22  12 19   Temp:    (!) 97.5 F (36.4 C)  TempSrc:    Oral  SpO2: 96%  95% 99%  Weight:      Height:        Intake/Output Summary (Last 24 hours) at 01/04/2019 1606 Last data filed at 01/04/2019 1337 Gross per 24 hour  Intake 480 ml  Output -  Net 480 ml   Last 3 Weights 01/03/2019 11/01/2018 06/28/2018  Weight (lbs) 230 lb 234 lb 8 oz 230 lb 8 oz  Weight (kg) 104.327 kg 106.369 kg 104.554 kg  Body mass index is 33.97 kg/m. General:  Well nourished, well developed, in no acute distress HEENT: normal Lymph: no  adenopathy Neck: no JVD Endocrine:  No thryomegaly Vascular: No carotid bruits; FA pulses 2+ bilaterally without bruits  Cardiac:  normal S1, S2; RRR; no murmur  Lungs:  clear to auscultation bilaterally, no wheezing, rhonchi or rales  Abd: soft, nontender, no hepatomegaly  Ext: no edema Musculoskeletal:  No deformities, BUE and BLE strength normal and equal Skin: warm and dry  Neuro:  CNs 2-12 intact, no focal abnormalities noted Psych:  Normal affect   EKG:  The EKG was personally reviewed and demonstrates:    Telemetry:  Telemetry was personally reviewed and demonstrates:    Relevant CV Studies:  Cardiac catheterization January 2019 1.  Significant underlying three-vessel coronary artery disease with patent stents in the left circumflex and right coronary arteries.  Patent LIMA to LAD and SVG to diagonal.  Known chronically occluded SVG to left circumflex and atretic RIMA to RCA (not needed given no obstructive disease in native vessels).  Moderate ostial RCA stenosis with catheter-induced spasm that improved with nitroglycerin. 2.  Normal left ventricular end-diastolic pressure.  Overall, no significant change in coronary anatomy since most recent cardiac catheterization.  Recommend continuing medical therapy.  I added Imdur for symptomatic relief.  If the patient continues to have significant dyspnea, outpatient pulmonary evaluation might be needed.  Laboratory Data:  Chemistry Recent Labs  Lab 01/03/19 1834  NA 136  K 3.5  CL 103  CO2 27  GLUCOSE 145*  BUN 14  CREATININE 1.05  CALCIUM 8.8*  GFRNONAA >60  GFRAA >60  ANIONGAP 6    Recent Labs  Lab 01/03/19 1834  PROT 6.9  ALBUMIN 4.3  AST 22  ALT 19  ALKPHOS 47  BILITOT 0.8   Hematology Recent Labs  Lab 01/03/19 1834  WBC 9.3  RBC 4.86  HGB 15.1  HCT 43.1  MCV 88.7  MCH 31.1  MCHC 35.0  RDW 12.0  PLT 243   Cardiac Enzymes Recent Labs  Lab 01/03/19 1834 01/03/19 2358 01/04/19 0633    TROPONINI <0.03 <0.03 <0.03   No results for input(s): TROPIPOC in the last 168 hours.  BNPNo results for input(s): BNP, PROBNP in the last 168 hours.  DDimer No results for input(s): DDIMER in the last 168 hours.  Radiology/Studies:  Dg Chest 2 View  Result Date: 01/03/2019 CLINICAL DATA:  Chest pain for several months. EXAM: CHEST - 2 VIEW COMPARISON:  None. FINDINGS: The heart size and mediastinal contours are stable. Both lungs are clear. The visualized skeletal structures are unremarkable. IMPRESSION: No active cardiopulmonary disease. Electronically Signed   By: Abelardo Diesel M.D.   On: 01/03/2019 19:05    Assessment and Plan:   1. Chest pain Typical and atypical features, dating back many years Unclear if this is cardiac or GI related, and prior work-up, more concerning for GI in etiology Negative cardiac enzymes x3, no significant EKG changes Previous stress test and cardiac catheterization January 2019 for similar symptoms At that time medical management was recommended He has continued to have symptoms sometimes up to 10/10, etiology unclear Previous EGD unrevealing -Options for work-up include cardiac CT with contrast  -Right upper quadrant ultrasound, rule out gallbladder disease --We will hold his losartan in effort to increase isosorbide 30 twice daily Agree with PPI twice daily --For acute episodes would take nitro sublingual (for some reason does not have any at home) --Could consider  Levsin for esophageal spasm --Consider GI cocktail for acute symptoms as he reports this has helped somewhat in the past If no improvement of symptoms were discussed with Dr. Fletcher Anon further cardiac catheterization indicated  Hyperlipidemia Well controlled on Repatha  Hypertension Consider holding losartan, increasing isosorbide up to 30 twice daily  PVCs On beta-blocker, Ranexa Asymptomatic  Exertional shortness of breath Deconditioning, Would recommend regular exercise  program  Long discussion with him concerning his long history of chest pain Discussed GI work-up, long cardiac history and cardiac work-up Medications he has tried that have helped his pain both GI related and cardiac Case discussed with hospitalist in detail  Total encounter time more than 110 minutes  Greater than 50% was spent in counseling and coordination of care with the patient    For questions or updates, please contact [facility] Please consult [email or web address] for contact info under     Signed, Ida Rogue, MD  01/04/2019 4:06 PM

## 2019-01-04 NOTE — Progress Notes (Addendum)
[facility] [place] NAME: Chase Scott    MR#:  [id number]  DATE OF BIRTH:  Feb 03, 1956  SUBJECTIVE:   Chest pain has completely resolved. Patient feeling better.  Denies any shortness of breath, diaphoresis, nausea.  REVIEW OF SYSTEMS:  Review of Systems  Constitutional: Negative for chills and fever.  HENT: Negative for congestion and sore throat.   Eyes: Negative for blurred vision and double vision.  Respiratory: Negative for cough and shortness of breath.   Cardiovascular: Negative for chest pain and palpitations.  Gastrointestinal: Negative for nausea and vomiting.  Genitourinary: Negative for dysuria and urgency.  Musculoskeletal: Negative for back pain and neck pain.  Neurological: Negative for dizziness and headaches.  Psychiatric/Behavioral: Negative for depression. The patient is not nervous/anxious.    DRUG ALLERGIES:   Allergies  Allergen Reactions  . Penicillins Anaphylaxis  . Sulfa Antibiotics Anaphylaxis  . Etodolac   . Lipitor [Atorvastatin Calcium]     Myalgias @ 80 mg  . Paxil [Paroxetine Hcl]   . Statins     Myalgias on high dose lipitor and moderate dose crestor.  . Sulfasalazine    VITALS:  Blood pressure (!) 154/87, pulse 67, temperature (!) 97.5 F (36.4 C), temperature source Oral, resp. rate 19, height 5\' 9"  (1.753 m), weight 104.3 kg, SpO2 99 %. PHYSICAL EXAMINATION:  Physical Exam  Constitutional: Well-appearing, lying in bed in no acute distress. HEENT: Normocephalic, atraumatic, EOMI, no scleral icterus, moist mucous membranes Neck: Supple, normal range of motion Cardiovascular: RRR, no murmurs, rubs, gallops Pulmonary: Lungs clear to auscultation bilaterally, normal work of breathing Abdominal: +BS, soft, nontender, nondistended Musculoskeletal:  No pedal edema, cyanosis, clubbing Skin: Warm and dry, no rashes or lesions Neurological: Cranial nerves II through XII grossly intact, no focal  deficits Psychiatric: Alert and oriented x3, appropriate affect LABORATORY PANEL:  Male CBC Recent Labs  Lab 01/03/19 1834  WBC 9.3  HGB 15.1  HCT 43.1  PLT 243   ------------------------------------------------------------------------------------------------------------------ Chemistries  Recent Labs  Lab 01/03/19 1834  NA 136  K 3.5  CL 103  CO2 27  GLUCOSE 145*  BUN 14  CREATININE 1.05  CALCIUM 8.8*  AST 22  ALT 19  ALKPHOS 47  BILITOT 0.8   RADIOLOGY:  Dg Chest 2 View  Result Date: 01/03/2019 CLINICAL DATA:  Chest pain for several months. EXAM: CHEST - 2 VIEW COMPARISON:  None. FINDINGS: The heart size and mediastinal contours are stable. Both lungs are clear. The visualized skeletal structures are unremarkable. IMPRESSION: No active cardiopulmonary disease. Electronically Signed   By: Abelardo Diesel M.D.   On: 01/03/2019 19:05   ASSESSMENT AND PLAN:   Atypical chest pain- does not seem cardiac in etiology, as pain is not worse with exertion.  Troponins negative x3.  EKG unremarkable. He does have a history of CAD s/p CABG. May be due to esophageal spasm versus GERD.  Recent EGD 04/29/2018 with normal esophagus and some erythematous mucosa in the antrum. -Cardiology following -Continue nitro as needed -Continue metoprolol, imdur, ranexa, aspirin, plavix, ARB -Increase home Protonix to 40 mg twice daily.  Add Pepcid 20 mg nightly and GI cocktail every 4 hours as needed -Can try some Levsin SL or calcium channel blocker if no improvement -Consider outpatient GI follow-up   Hyperglycemia- glucose mildly elevated -Check a1c  Chronic GERD- seems uncontrolled -Treatment as above  Hyperlipidemia- stable. Recent LDL 13.  All the records are reviewed and case discussed with  Care Management/Social Worker. Management plans discussed with the patient, family and they are in agreement.  CODE STATUS: Full Code  TOTAL TIME TAKING CARE OF THIS PATIENT: 40 minutes.    More than 50% of the time was spent in counseling/coordination of care: YES  POSSIBLE D/C IN 1-2 DAYS, DEPENDING ON CLINICAL CONDITION.   Berna Spare Deane Wattenbarger M.D on 01/04/2019 at 3:02 PM  Between 7am to 6pm - Pager - [phone]  After 6pm go to [email or web address] - Proofreader  [facility]  Office  [phone]  CC: Primary care physician; Baxter Hire, MD  Note: This dictation was prepared with Dragon dictation along with smaller phrase technology. Any transcriptional errors that result from this process are unintentional.

## 2019-01-05 ENCOUNTER — Observation Stay: Payer: BLUE CROSS/BLUE SHIELD

## 2019-01-05 DIAGNOSIS — I25118 Atherosclerotic heart disease of native coronary artery with other forms of angina pectoris: Secondary | ICD-10-CM | POA: Diagnosis not present

## 2019-01-05 DIAGNOSIS — E782 Mixed hyperlipidemia: Secondary | ICD-10-CM | POA: Diagnosis not present

## 2019-01-05 DIAGNOSIS — R079 Chest pain, unspecified: Secondary | ICD-10-CM | POA: Diagnosis not present

## 2019-01-05 DIAGNOSIS — R739 Hyperglycemia, unspecified: Secondary | ICD-10-CM | POA: Diagnosis not present

## 2019-01-05 DIAGNOSIS — K219 Gastro-esophageal reflux disease without esophagitis: Secondary | ICD-10-CM | POA: Diagnosis not present

## 2019-01-05 DIAGNOSIS — R1013 Epigastric pain: Secondary | ICD-10-CM | POA: Diagnosis not present

## 2019-01-05 DIAGNOSIS — I2510 Atherosclerotic heart disease of native coronary artery without angina pectoris: Secondary | ICD-10-CM | POA: Diagnosis not present

## 2019-01-05 LAB — BASIC METABOLIC PANEL
Anion gap: 5 (ref 5–15)
BUN: 15 mg/dL (ref 8–23)
CHLORIDE: 103 mmol/L (ref 98–111)
CO2: 29 mmol/L (ref 22–32)
Calcium: 8.7 mg/dL — ABNORMAL LOW (ref 8.9–10.3)
Creatinine, Ser: 1.07 mg/dL (ref 0.61–1.24)
GFR calc Af Amer: >60 mL/min (ref >60)
GFR calc non Af Amer: >60 mL/min (ref >60)
Glucose, Bld: 101 mg/dL — ABNORMAL HIGH (ref 70–99)
Potassium: 3.9 mmol/L (ref 3.5–5.1)
Sodium: 137 mmol/L (ref 135–145)

## 2019-01-05 LAB — CBC
HCT: 44.4 % (ref 39.0–52.0)
Hemoglobin: 15.2 g/dL (ref 13.0–17.0)
MCH: 31 pg (ref 26.0–34.0)
MCHC: 34.2 g/dL (ref 30.0–36.0)
MCV: 90.4 fL (ref 80.0–100.0)
Platelets: 213 10*3/uL (ref 150–400)
RBC: 4.91 MIL/uL (ref 4.22–5.81)
RDW: 12 % (ref 11.5–15.5)
WBC: 7.1 10*3/uL (ref 4.0–10.5)
nRBC: 0 % (ref 0.0–0.2)

## 2019-01-05 MED ORDER — HYOSCYAMINE SULFATE 0.125 MG SL SUBL
0.2500 mg | SUBLINGUAL_TABLET | SUBLINGUAL | Status: DC
  Administered 2019-01-05: 0.25 mg via SUBLINGUAL
  Filled 2019-01-05 (×3): qty 2

## 2019-01-05 MED ORDER — ISOSORBIDE MONONITRATE ER 30 MG PO TB24: 30.0000 mg | ORAL_TABLET | Freq: Two times a day (BID) | ORAL | Status: DC

## 2019-01-05 MED ORDER — PANTOPRAZOLE SODIUM 40 MG PO TBEC: 40.0000 mg | DELAYED_RELEASE_TABLET | Freq: Two times a day (BID) | ORAL | 0 refills | Status: DC

## 2019-01-05 MED ORDER — FAMOTIDINE 20 MG PO TABS: 20.0000 mg | ORAL_TABLET | Freq: Every day | ORAL | 0 refills | Status: DC

## 2019-01-05 MED ORDER — RANOLAZINE ER 1000 MG PO TB12: 1000.0000 mg | ORAL_TABLET | Freq: Two times a day (BID) | ORAL | 0 refills | Status: DC

## 2019-01-05 MED ORDER — ISOSORBIDE MONONITRATE ER 30 MG PO TB24: 30.0000 mg | ORAL_TABLET | Freq: Two times a day (BID) | ORAL | 0 refills | Status: DC

## 2019-01-05 MED ORDER — NITROGLYCERIN 0.4 MG SL SUBL: 0.4000 mg | SUBLINGUAL_TABLET | SUBLINGUAL | 0 refills | Status: AC | PRN

## 2019-01-05 MED ORDER — HYOSCYAMINE SULFATE 0.125 MG SL SUBL: 0.2500 mg | SUBLINGUAL_TABLET | SUBLINGUAL | 0 refills | Status: DC | PRN

## 2019-01-05 NOTE — Discharge Summary (Signed)
[facility] [place] NAME: Chase Scott    MR#:  [id number]  DATE OF BIRTH:  Dec 26, 1955  DATE OF ADMISSION:  01/03/2019   ADMITTING PHYSICIAN: Arta Silence, MD  DATE OF DISCHARGE: 01/05/19  PRIMARY CARE PHYSICIAN: Baxter Hire, MD   ADMISSION DIAGNOSIS:  Unstable angina pectoris due to coronary arteriosclerosis ([place]) [I25.110] DISCHARGE DIAGNOSIS:  Active Problems:   Chest pain  SECONDARY DIAGNOSIS:   Past Medical History:  Diagnosis Date  . Alcoholism ([place])   . Anginal pain ([place])   . CAD (coronary artery disease)    a. 01/2003 s/p PCI to RCA;  b. 02/2003 s/ CABG x 4 (LIMA->LAD, RIMA->RCA, VG->Diag, VG->LCX; c. 2005 s/p PCI to OM2; d. 06/2012 Cath: LM nl, LAD 167m, LCX mild plaque, OM1 min irregs, OM2 patent stent, RCA 75m ISR, VG->LCX 100, VG->Diag ok, RIMA->RCA 100, LIMA->LAD ok, EF 50-55%-->Med Rx.  . Chronic Chest pain   . Diastolic dysfunction    a. 11/2017 Echo: EF 50-55%, no rwma, Gr1 DD, mildly dil LA.  Marland Kitchen Dyslipidemia   . Gastroesophageal reflux disease   . Gastrointestinal symptoms    Chronic gastrointestinal symptoms  . History of kidney stones   . History of left shoulder fracture   . Hyperlipidemia   . Hypertension   . Hypertensive cardiovascular disease    a. Labile BPs.  . Rotator cuff syndrome    HOSPITAL COURSE:   Chase Scott is a 63 year old male who presented to the ED with chest pain.  In the ED, EKG was unremarkable and troponin was negative.  He was admitted for further management.  Atypical chest pain- does not seem cardiac in etiology, though patient does have a history of CAD s/p CABG. -Troponins negative x3.  EKG unremarkable.  -Continue nitro as needed -Continue metoprolol, aspirin, plavix -Imdur dose increased to 30 mg twice daily -Ranexa dose increased to 1000 mg twice daily -Started on Protonix twice daily and Pepcid nightly -Started on Levsin SL to see if this helps -Consider adding  calcium channel blocker for esophageal spasm if blood pressure can tolerate this as an outpatient -Right upper quadrant abdominal ultrasound performed to rule out gallbladder disease, and only showed hepatic steatosis -H. pylori labs pending at the time of discharge -Needs to follow-up with both cardiology and gastroenterology as an outpatient  Hypertension -Continued metoprolol -Losartan discontinued due to increasing dose of Imdur to 30 mg twice daily (BPs were a little on the low side)  Hyperglycemia- glucose mildly elevated -A1c pending at the time of discharge  Chronic GERD- seems uncontrolled -H. pylori labs ordered -Treatment as above  Hyperlipidemia- stable. Recent LDL 13.  Hepatic steatosis- incidental finding on abdominal ultrasound -Follow as an outpatient  DISCHARGE CONDITIONS:  Chronic atypical chest pain Hypertension Hyperglycemia Chronic GERD Hyperlipidemia Hepatic steatosis CONSULTS OBTAINED:  Treatment Team:  Arta Silence, MD Minna Merritts, MD DRUG ALLERGIES:   Allergies  Allergen Reactions  . Penicillins Anaphylaxis  . Sulfa Antibiotics Anaphylaxis  . Etodolac   . Lipitor [Atorvastatin Calcium]     Myalgias @ 80 mg  . Paxil [Paroxetine Hcl]   . Statins     Myalgias on high dose lipitor and moderate dose crestor.  . Sulfasalazine    DISCHARGE MEDICATIONS:   Allergies as of 01/05/2019      Reactions   Penicillins Anaphylaxis   Sulfa Antibiotics Anaphylaxis   Etodolac    Lipitor [atorvastatin Calcium]    Myalgias @ 80 mg  Paxil [paroxetine Hcl]    Statins    Myalgias on high dose lipitor and moderate dose crestor.   Sulfasalazine       Medication List    TAKE these medications   aspirin 81 MG EC tablet Take 1 tablet (81 mg total) by mouth daily.   clopidogrel 75 MG tablet Commonly known as:  PLAVIX Take 1 tablet (75 mg total) by mouth daily.   famotidine 20 MG tablet Commonly known as:  PEPCID Take 1 tablet (20 mg  total) by mouth at bedtime.   hyoscyamine 0.125 MG SL tablet Commonly known as:  LEVSIN SL Place 2 tablets (0.25 mg total) under the tongue every 4 (four) hours as needed (esophageal spasm).   isosorbide mononitrate 30 MG 24 hr tablet Commonly known as:  IMDUR Take 1 tablet (30 mg total) by mouth daily.   losartan 25 MG tablet Commonly known as:  COZAAR Take 1 tablet (25 mg total) by mouth daily.   nitroGLYCERIN 0.4 MG SL tablet Commonly known as:  NITROSTAT Place 1 tablet (0.4 mg total) under the tongue every 5 (five) minutes as needed for chest pain.   pantoprazole 40 MG tablet Commonly known as:  PROTONIX Take 1 tablet (40 mg total) by mouth 2 (two) times daily before a meal. What changed:  when to take this   ranolazine 500 MG 12 hr tablet Commonly known as:  RANEXA Take 1 tablet (500 mg total) by mouth 2 (two) times daily.   REPATHA SURECLICK 629 MG/ML So[place] Generic drug:  Evolocumab Inject 140 mg into the skin every 14 (fourteen) days.   sucralfate 1 g tablet Commonly known as:  CARAFATE Take 1 g by mouth as needed.   TOPROL XL 100 MG 24 hr tablet Generic drug:  metoprolol succinate Take 150 mg by mouth daily.        DISCHARGE INSTRUCTIONS:  1.  Follow-up with PCP in 5 days 2.  Follow-up with cardiology and gastroenterology in 1 to 2 weeks 3.  Increased doses of both Imdur and Ranexa 4.  Losartan discontinued due to soft BPs 5.  Started on Protonix twice daily and Pepcid nightly 6.  Started on Levsin SL for possible esophageal spasm 7.  H pylori labs and A1c were pending at the time of discharge DIET:  Cardiac diet DISCHARGE CONDITION:  Stable ACTIVITY:  Activity as tolerated OXYGEN:  Home Oxygen: No.  Oxygen Delivery: room air DISCHARGE LOCATION:  home   If you experience worsening of your admission symptoms, develop shortness of breath, life threatening emergency, suicidal or homicidal thoughts you must seek medical attention immediately by  calling 911 or calling your MD immediately  if symptoms less severe.  You Must read complete instructions/literature along with all the possible adverse reactions/side effects for all the Medicines you take and that have been prescribed to you. Take any new Medicines after you have completely understood and accpet all the possible adverse reactions/side effects.   Please note  You were cared for by a hospitalist during your hospital stay. If you have any questions about your discharge medications or the care you received while you were in the hospital after you are discharged, you can call the unit and asked to speak with the hospitalist on call if the hospitalist that took care of you is not available. Once you are discharged, your primary care physician will handle any further medical issues. Please note that NO REFILLS for any discharge medications will be authorized once you  are discharged, as it is imperative that you return to your primary care physician (or establish a relationship with a primary care physician if you do not have one) for your aftercare needs so that they can reassess your need for medications and monitor your lab values.    On the day of Discharge:  VITAL SIGNS:  Blood pressure 114/70, pulse 68, temperature 98.1 F (36.7 C), temperature source Oral, resp. rate 19, height 5\' 9"  (1.753 m), weight 104.3 kg, SpO2 100 %. PHYSICAL EXAMINATION:  GENERAL:  63 y.o.-year-old patient lying in the bed with no acute distress.  EYES: Pupils equal, round, reactive to light and accommodation. No scleral icterus. Extraocular muscles intact.  HEENT: Head atraumatic, normocephalic. Oropharynx and nasopharynx clear.  NECK:  Supple, no jugular venous distention. No thyroid enlargement, no tenderness.  LUNGS: Normal breath sounds bilaterally, no wheezing, rales,rhonchi or crepitation. No use of accessory muscles of respiration.  CARDIOVASCULAR: S1, S2 normal. No murmurs, rubs, or gallops.    ABDOMEN: Soft, non-tender, non-distended. Bowel sounds present. No organomegaly or mass.  EXTREMITIES: No pedal edema, cyanosis, or clubbing.  NEUROLOGIC: Cranial nerves II through XII are intact. Muscle strength 5/5 in all extremities. Sensation intact. Gait not checked.  PSYCHIATRIC: The patient is alert and oriented x 3.  SKIN: No obvious rash, lesion, or ulcer.  DATA REVIEW:   CBC Recent Labs  Lab 01/05/19 0420  WBC 7.1  HGB 15.2  HCT 44.4  PLT 213    Chemistries  Recent Labs  Lab 01/03/19 1834 01/05/19 0420  NA 136 137  K 3.5 3.9  CL 103 103  CO2 27 29  GLUCOSE 145* 101*  BUN 14 15  CREATININE 1.05 1.07  CALCIUM 8.8* 8.7*  AST 22  --   ALT 19  --   ALKPHOS 47  --   BILITOT 0.8  --      Microbiology Results  No results found for this or any previous visit.  RADIOLOGY:  US Abdomen Limited Ruq  Result Date: 01/05/2019 CLINICAL DATA:  Epigastric pain. EXAM: ULTRASOUND ABDOMEN LIMITED RIGHT UPPER QUADRANT COMPARISON:  None FINDINGS: Gallbladder: No gallstones or wall thickening visualized. No sonographic Murphy sign noted by sonographer. Common bile duct: Diameter: 5.2 mm Liver: Probable hepatic steatosis. No focal mass. Portal vein is patent on color Doppler imaging with normal direction of blood flow towards the liver. IMPRESSION: Probable hepatic steatosis with increased echogenicity in the liver. No other abnormalities. Electronically Signed   By: Dorise Bullion III M.D   On: 01/05/2019 15:14     Management plans discussed with the patient, family and they are in agreement.  CODE STATUS: Full Code   TOTAL TIME TAKING CARE OF THIS PATIENT: 45 minutes.    Berna Spare Glenette Bookwalter M.D on 01/05/2019 at 3:56 PM  Between 7am to 6pm - Pager - [phone]  After 6pm go to [email or web address] - Proofreader  [facility]  Office  [phone]  CC: Primary care physician; Baxter Hire, MD   Note: This dictation was prepared with Dragon  dictation along with smaller phrase technology. Any transcriptional errors that result from this process are unintentional.

## 2019-01-05 NOTE — Progress Notes (Signed)
[facility] [place] NAME: Flynn Gwyn    MR#:  [id number]  DATE OF BIRTH:  June 03, 1956  SUBJECTIVE:   Patient endorsing some mild chest pain today.  The chest pain is worse with exertion. He states it is similar to his chronic chest pain. He denies any shortness of breath.  REVIEW OF SYSTEMS:  Review of Systems  Constitutional: Negative for chills and fever.  HENT: Negative for congestion and sore throat.   Eyes: Negative for blurred vision and double vision.  Respiratory: Negative for cough and shortness of breath.   Cardiovascular: Negative for chest pain and palpitations.  Gastrointestinal: Negative for nausea and vomiting.  Genitourinary: Negative for dysuria and urgency.  Musculoskeletal: Negative for back pain and neck pain.  Neurological: Negative for dizziness and headaches.  Psychiatric/Behavioral: Negative for depression. The patient is not nervous/anxious.    DRUG ALLERGIES:   Allergies  Allergen Reactions  . Penicillins Anaphylaxis  . Sulfa Antibiotics Anaphylaxis  . Etodolac   . Lipitor [Atorvastatin Calcium]     Myalgias @ 80 mg  . Paxil [Paroxetine Hcl]   . Statins     Myalgias on high dose lipitor and moderate dose crestor.  . Sulfasalazine    VITALS:  Blood pressure 113/71, pulse 78, temperature 98.1 F (36.7 C), temperature source Oral, resp. rate 16, height 5\' 9"  (1.753 m), weight 104.3 kg, SpO2 98 %. PHYSICAL EXAMINATION:  Physical Exam  Constitutional: Well-appearing, lying in bed in no acute distress. HEENT: Normocephalic, atraumatic, EOMI, no scleral icterus, moist mucous membranes Neck: Supple, normal range of motion Cardiovascular: RRR, no murmurs, rubs, gallops Pulmonary: Lungs clear to auscultation bilaterally, normal work of breathing Abdominal: +BS, soft, nontender, nondistended Musculoskeletal:  No pedal edema, cyanosis, clubbing Skin: Warm and dry, no rashes or lesions Neurological: Cranial  nerves II through XII grossly intact, no focal deficits Psychiatric: Alert and oriented x3, appropriate affect LABORATORY PANEL:  Male CBC Recent Labs  Lab 01/05/19 0420  WBC 7.1  HGB 15.2  HCT 44.4  PLT 213   ------------------------------------------------------------------------------------------------------------------ Chemistries  Recent Labs  Lab 01/03/19 1834 01/05/19 0420  NA 136 137  K 3.5 3.9  CL 103 103  CO2 27 29  GLUCOSE 145* 101*  BUN 14 15  CREATININE 1.05 1.07  CALCIUM 8.8* 8.7*  AST 22  --   ALT 19  --   ALKPHOS 47  --   BILITOT 0.8  --    RADIOLOGY:  No results found. ASSESSMENT AND PLAN:   Atypical chest pain- does not seem cardiac in etiology. Troponins negative x3.  EKG unremarkable. He does have a history of CAD s/p CABG. May be due to esophageal spasm versus GERD.  Recent EGD 04/29/2018 with normal esophagus and some erythematous mucosa in the antrum. -Cardiology following -Continue nitro as needed -Continue metoprolol, imdur, ranexa, aspirin, plavix, ARB -Continue Protonix twice daily and Pepcid nightly, with GI cocktails every 4 hours as needed -Start Levsin SL to see if this helps -Consider adding calcium channel blocker for esophageal spasm, although BP is on the low side today -Obtain right upper quadrant abdominal ultrasound to rule out gallbladder disease -Check H. pylori -Consider outpatient GI follow-up   Hyperglycemia- glucose mildly elevated -A1c pending  Chronic GERD- seems uncontrolled -H. pylori labs ordered -Treatment as above  Hyperlipidemia- stable. Recent LDL 13.  All the records are reviewed and case discussed with Care Management/Social Worker. Management plans discussed with the patient, family and  they are in agreement.  CODE STATUS: Full Code  TOTAL TIME TAKING CARE OF THIS PATIENT: 40 minutes.   More than 50% of the time was spent in counseling/coordination of care: YES  POSSIBLE D/C tomorrow, DEPENDING ON  CLINICAL CONDITION.   Berna Spare Rozalyn Osland M.D on 01/05/2019 at 3:07 PM  Between 7am to 6pm - Pager [phone]  After 6pm go to [email or web address] - Proofreader  [facility]  Office  [phone]  CC: Primary care physician; Baxter Hire, MD  Note: This dictation was prepared with Dragon dictation along with smaller phrase technology. Any transcriptional errors that result from this process are unintentional.

## 2019-01-05 NOTE — Progress Notes (Signed)
Pt to be discharged this evening. Iv and tele removed. disch instructions and prescrips given to pt to his understanding. Awaiting wife to transport.

## 2019-01-05 NOTE — Progress Notes (Signed)
Progress Note  Patient Name: Chase Scott Date of Encounter: 01/05/2019  Primary Cardiologist: [name], MD   Subjective   Reports having mild central chest discomfort at rest, nothing severe like what brought him in Able to exert himself without exacerbating the discomfort He does not feel it is cardiac, wonders about something else, like complication from the surgery or GI related  Inpatient Medications    Scheduled Meds: . aspirin  81 mg Oral Daily  . clopidogrel  75 mg Oral Daily  . enoxaparin (LOVENOX) injection  40 mg Subcutaneous Q24H  . famotidine  20 mg Oral QHS  . hyoscyamine  0.25 mg Sublingual Q4H  . isosorbide mononitrate  30 mg Oral Daily  . lidocaine  15 mL Oral Once  . losartan  25 mg Oral Daily  . metoprolol succinate  150 mg Oral Daily  . pantoprazole  40 mg Oral BID AC  . ranolazine  500 mg Oral BID   Continuous Infusions:  PRN Meds: acetaminophen, alum & mag hydroxide-simeth **AND** lidocaine, bisacodyl, morphine injection, nitroGLYCERIN, ondansetron (ZOFRAN) IV, senna-docusate   Vital Signs    Vitals:   01/04/19 2004 01/05/19 0401 01/05/19 0800 01/05/19 1546  BP: (!) 113/59 (!) 142/74 113/71 114/70  Pulse: 68 76 78 68  Resp: 14 18 16 19   Temp: 98.4 F (36.9 C) 97.8 F (36.6 C) 98.1 F (36.7 C)   TempSrc: Oral Oral Oral   SpO2: 95% 97% 98% 100%  Weight:      Height:        Intake/Output Summary (Last 24 hours) at 01/05/2019 1551 Last data filed at 01/05/2019 1340 Gross per 24 hour  Intake 720 ml  Output -  Net 720 ml   Last 3 Weights 01/03/2019 11/01/2018 06/28/2018  Weight (lbs) 230 lb 234 lb 8 oz 230 lb 8 oz  Weight (kg) 104.327 kg 106.369 kg 104.554 kg      Telemetry    Normal sinus rhythm- Personally Reviewed  ECG     - Personally Reviewed  Physical Exam   Constitutional:  oriented to person, place, and time. No distress.  HENT:  Head: Grossly normal Eyes:  no discharge. No scleral icterus.  Neck: No JVD, no  carotid bruits  Cardiovascular: Regular rate and rhythm, no murmurs appreciated Pulmonary/Chest: Clear to auscultation bilaterally, no wheezes or rails Abdominal: Soft.  no distension.  no tenderness.  Musculoskeletal: Normal range of motion Neurological:  normal muscle tone. Coordination normal. No atrophy Skin: Skin warm and dry Psychiatric: normal affect, pleasant   Labs    Chemistry Recent Labs  Lab 01/03/19 1834 01/05/19 0420  NA 136 137  K 3.5 3.9  CL 103 103  CO2 27 29  GLUCOSE 145* 101*  BUN 14 15  CREATININE 1.05 1.07  CALCIUM 8.8* 8.7*  PROT 6.9  --   ALBUMIN 4.3  --   AST 22  --   ALT 19  --   ALKPHOS 47  --   BILITOT 0.8  --   GFRNONAA >60 >60  GFRAA >60 >60  ANIONGAP 6 5     Hematology Recent Labs  Lab 01/03/19 1834 01/05/19 0420  WBC 9.3 7.1  RBC 4.86 4.91  HGB 15.1 15.2  HCT 43.1 44.4  MCV 88.7 90.4  MCH 31.1 31.0  MCHC 35.0 34.2  RDW 12.0 12.0  PLT 243 213    Cardiac Enzymes Recent Labs  Lab 01/03/19 1834 01/03/19 2358 01/04/19 0633  TROPONINI <0.03 <0.03 <0.03  No results for input(s): TROPIPOC in the last 168 hours.   BNPNo results for input(s): BNP, PROBNP in the last 168 hours.   DDimer No results for input(s): DDIMER in the last 168 hours.   Radiology    Dg Chest 2 View  Result Date: 01/03/2019 CLINICAL DATA:  Chest pain for several months. EXAM: CHEST - 2 VIEW COMPARISON:  None. FINDINGS: The heart size and mediastinal contours are stable. Both lungs are clear. The visualized skeletal structures are unremarkable. IMPRESSION: No active cardiopulmonary disease. Electronically Signed   By: Abelardo Diesel M.D.   On: 01/03/2019 19:05   US Abdomen Limited Ruq  Result Date: 01/05/2019 CLINICAL DATA:  Epigastric pain. EXAM: ULTRASOUND ABDOMEN LIMITED RIGHT UPPER QUADRANT COMPARISON:  None FINDINGS: Gallbladder: No gallstones or wall thickening visualized. No sonographic Murphy sign noted by sonographer. Common bile duct: Diameter:  5.2 mm Liver: Probable hepatic steatosis. No focal mass. Portal vein is patent on color Doppler imaging with normal direction of blood flow towards the liver. IMPRESSION: Probable hepatic steatosis with increased echogenicity in the liver. No other abnormalities. Electronically Signed   By: Dorise Bullion III M.D   On: 01/05/2019 15:14    Cardiac Studies   Prior cardiac catheterization January 2019 1.  Significant underlying three-vessel coronary artery disease with patent stents in the left circumflex and right coronary arteries.  Patent LIMA to LAD and SVG to diagonal.  Known chronically occluded SVG to left circumflex and atretic RIMA to RCA (not needed given no obstructive disease in native vessels).  Moderate ostial RCA stenosis with catheter-induced spasm that improved with nitroglycerin. 2.  Normal left ventricular end-diastolic pressure.  Overall, no significant change in coronary anatomy since most recent cardiac catheterization.  Recommend continuing medical therapy.  I added Imdur for symptomatic relief.  If the patient continues to have significant dyspnea, outpatient pulmonary evaluation might be needed.    Patient Profile     Chase Scott is a 63 y.o. male with a hx of former smoker , coronary artery disease, bypass surgery, prior stenting with long history of chronic atypical chest pain, who presents with severe chest pain  Assessment & Plan    1. Chest pain Typical and atypical features, dating back many years -On further discussion less likely cardiac Possibly musculoskeletal versus GI in etiology Negative cardiac enzymes x3, no significant EKG changes They prefers no cardiac catheterization this admission Previous stress test and cardiac catheterization January 2019 for similar symptoms At that time medical management was recommended Previous EGD unrevealing -Reports numerous things have helped his pain from antibiotics to GI cocktail to Nitropaste and morphine to  Mylanta to drinking a soda to PPIs ---Consider increasing isosorbide up to 30 twice daily ---Medicine service ordered right upper quadrant ultrasound to evaluate gallbladder ---As outpatient could consider chest CT scan Agree with PPI twice daily -As outpatient could try GI cocktail for acute symptoms Could try Levsin for esophageal dysmotility and spasm Needs refill on his nitro sublingual for spasm -As outpatient could increase Ranexa up to 1000 twice daily All of the above was discussed with him in detail  Hyperlipidemia Well controlled on Repatha  Hypertension Hold losartan, increase isosorbide up to 30 twice daily  PVCs On beta-blocker,  Ranexa up to 1000 twice daily Asymptomatic  Exertional shortness of breath Deconditioning, Would recommend regular exercise program   Total encounter time more than 25 minutes  Greater than 50% was spent in counseling and coordination of care with the patient  For questions or updates, please contact [place] [facility] Please consult [email or web address] for contact info under        Signed, Ida Rogue, MD  01/05/2019, 3:51 PM

## 2019-01-05 NOTE — Discharge Instructions (Signed)
It was so nice to meet you during this hospitalization!  You came into the hospital with chest pain. We do not think this was caused by your heart. We are wondering if your pain is related to your GI tract and esophagus.   Dr. Rockey Situ and I have made the following medication changes: 1. Take protonix 40mg  twice a day 2. Start taking pepcid every night (this is a reflux medication that works to reduce acid in a different way than protonix or nexium) 3. Use Levsin every 4 hours as needed for chest pain (try this- it is a medication that is used for esophageal spasm) 4. Use nitroglycerin as needed for chest pain 5. Stop Losartan and increase imdur to 30mg  twice a day (we think your blood pressure was too low to increase the imdur dose and still be able to keep giving you the losartan) 6. Increase Ranexa dose to 1000mg  twice a day  Your abdominal ultrasound showed some fatty infiltration in your liver, but no issues with your gallbladder. I tested you for H. Pylori, which is a bacteria that can cause burning and pain in your upper abdomen and lower chest. Your primary care doctor should follow-up with these results.  Please follow-up with your primary care doctor, cardiologist, and gastroenterologist when you leave the hospital.  Take care, Dr. Brett Albino

## 2019-01-06 LAB — HIV ANTIBODY (ROUTINE TESTING W REFLEX): HIV Screen 4th Generation wRfx: NONREACTIVE

## 2019-01-07 LAB — HEMOGLOBIN A1C
Hgb A1c MFr Bld: 5.5 % (ref 4.8–5.6)
Mean Plasma Glucose: 111 mg/dL

## 2019-01-08 LAB — H PYLORI, IGM, IGG, IGA AB
H Pylori IgG: 0.31 Index Value (ref 0.00–0.79)
H. Pylogi, Iga Abs: <9 units (ref 0.0–8.9)
H. Pylogi, Igm Abs: <9 units (ref 0.0–8.9)

## 2019-01-09 DIAGNOSIS — I10 Essential (primary) hypertension: Secondary | ICD-10-CM | POA: Diagnosis not present

## 2019-01-23 DIAGNOSIS — K219 Gastro-esophageal reflux disease without esophagitis: Secondary | ICD-10-CM | POA: Diagnosis not present

## 2019-01-23 DIAGNOSIS — Z0001 Encounter for general adult medical examination with abnormal findings: Secondary | ICD-10-CM | POA: Diagnosis not present

## 2019-01-23 DIAGNOSIS — I2510 Atherosclerotic heart disease of native coronary artery without angina pectoris: Secondary | ICD-10-CM | POA: Diagnosis not present

## 2019-01-23 DIAGNOSIS — I10 Essential (primary) hypertension: Secondary | ICD-10-CM | POA: Diagnosis not present

## 2019-02-06 ENCOUNTER — Telehealth: Payer: Self-pay | Admitting: Cardiovascular Disease

## 2019-02-06 DIAGNOSIS — I493 Ventricular premature depolarization: Secondary | ICD-10-CM

## 2019-02-06 NOTE — Telephone Encounter (Signed)
Incoming call from pt with 10 year hx of chest pain that has worsened over the past 2 weeks. He had hospital admission 2/14 where he had neg cardiac work up. He is being followed by GI.   Today he is having SOBOE and substernal pressure, "that feels like you're wringing out a wet rag".  No radiation, diaphoresis.  He denies increase in stress and reports today he has been working on remodeling mothers house. Fingers have been tingling and face has been flushed.    He has been complaint with ordered medications. While he was on the phone with me he tried both NTG and Levsin (spaced apart), he did not report alleviation of 6/10 pain.   Spoke to [place], DOD. He asked that patient decrease physical activity and remain compliant with GI follow up and POC.   Message routed to [facility], primary cardiologist to help with ongoing POC.    I requested pt take vitals while we were on the phone;  BP 161/97 119/72, HR 55

## 2019-02-06 NOTE — Telephone Encounter (Signed)
Pt c/o of Chest Pain: STAT if CP now or developed within 24 hours  1. Are you having CP right now? YES  2. Are you experiencing any other symptoms (ex. SOB, nausea, vomiting, sweating)? SOB  3. How long have you been experiencing CP? 3-4 DAYS  4. Is your CP continuous or coming and going? COMES AND GOES  5. Have you taken Nitroglycerin? NO  PT WAS IN THE ED 2 WEEKS AGO, AND STATES EVERYTHING CHECKED OUT OK.  ?

## 2019-02-07 NOTE — Telephone Encounter (Signed)
Patient has been made aware to make a GI appointment and to follow up with them.   He was also agreeable to an Echo. Orders have placed and message sent to scheduling.

## 2019-02-07 NOTE — Telephone Encounter (Signed)
His symptoms might be GI in nature.  He should keep follow-up with GI. I do recommend that we repeat his echocardiogram to ensure stability of ejection fraction with ongoing PVCs. He should follow-up with me after echocardiogram.  I might consider switching him from metoprolol to diltiazem in case he is having esophageal spasm.

## 2019-02-17 ENCOUNTER — Telehealth: Payer: Self-pay | Admitting: Cardiovascular Disease

## 2019-02-17 NOTE — Telephone Encounter (Signed)
-----   Message from Ricci Barker, RN sent at 02/17/2019  9:56 AM EDT ----- Regarding: FW: echo and follow up  ----- Message ----- From: Wellington Hampshire, MD Sent: 02/14/2019   6:10 PM EDT To: Ricci Barker, RN Subject: RE: echo and follow up                         1-2 weeks.   ----- Message ----- From: Ricci Barker, RN Sent: 02/14/2019   9:12 AM EDT To: Wellington Hampshire, MD Subject: FW: echo and follow up                         Urgency of echo? ----- Message ----- From: Clarisse Gouge Sent: 02/14/2019   8:36 AM EDT To: Clarisse Gouge, [facility] Triage Subject: FW: echo and follow up                         Please advise urgency for covid and if ov vs evisit.  ----- Message ----- From: Ricci Barker, RN Sent: 02/07/2019  11:52 AM EDT To: Rebeca Alert [facility] Scheduling Subject: echo and follow up                             Per Dr. Fletcher Anon, this patient needs to have an echo in the next 1-2 weeks and then follow up with Dr. Fletcher Anon after that.  Thank you, Lattie Haw

## 2019-02-17 NOTE — Telephone Encounter (Signed)
LMOV to schedule  

## 2019-02-18 NOTE — Telephone Encounter (Signed)
Scheduled

## 2019-02-25 ENCOUNTER — Ambulatory Visit (INDEPENDENT_AMBULATORY_CARE_PROVIDER_SITE_OTHER): Payer: BLUE CROSS/BLUE SHIELD

## 2019-02-25 ENCOUNTER — Telehealth: Payer: Self-pay

## 2019-02-25 ENCOUNTER — Other Ambulatory Visit: Payer: Self-pay

## 2019-02-25 DIAGNOSIS — I493 Ventricular premature depolarization: Secondary | ICD-10-CM | POA: Diagnosis not present

## 2019-02-25 MED ORDER — PERFLUTREN LIPID MICROSPHERE
1.0000 mL | INTRAVENOUS | Status: AC | PRN
  Administered 2019-02-25: 2 mL via INTRAVENOUS

## 2019-02-25 NOTE — Telephone Encounter (Addendum)
TELEPHONE EVISIT CONSENT OBTAINED VERBALLY.   YOUR CARDIOLOGY TEAM HAS ARRANGED FOR AN E-VISIT FOR YOUR APPOINTMENT - PLEASE REVIEW IMPORTANT INFORMATION BELOW SEVERAL DAYS PRIOR TO YOUR APPOINTMENT  Due to the recent COVID-19 pandemic, we are transitioning in-person office visits to tele-medicine visits in an effort to decrease unnecessary exposure to our patients and staff. Medicare and most insurances are covering these visits without a copay needed. We also encourage you to sign up for MyChart if you have not already done so. You will need a smartphone if possible. For patients that do not have this, we can still complete the visit using a regular telephone but do prefer a smartphone to enable video when possible. You may have a close family member that lives with you that can help. If possible, we also ask that you have a blood pressure cuff and scale at home to measure your blood pressure, heart rate and weight prior to your scheduled appointment. Patients with clinical needs that need an in-person evaluation and testing will still be able to come to the office if absolutely necessary. If you have any questions, feel free to call our office.    IF YOU HAVE A SMARTPHONE, PLEASE DOWNLOAD THE WEBEX APP TO YOUR SMARTPHONE  - If Apple, go to [employer] and type in WebEx in the search bar. [place] [employer], the blue/green circle. The app is free but as with any other app download, your phone may require you to verify saved payment information or Apple password. You do NOT have to create a WebEx account.  - If Android, go to [employer] and type in [employer] in the search bar. [place] [employer], the blue/green circle. The app is free but as with any other app download, your phone may require you to verify saved payment information or Android password. You do NOT have to create a WebEx account.  It is very helpful to have this downloaded before your visit.    2-3 DAYS  BEFORE YOUR APPOINTMENT  You will receive a telephone call from one of our [place] team members - your caller ID may say "Unknown caller." If this is a video visit, we will confirm that you have been able to download the WebEx app. We will remind you check your blood pressure, heart rate and weight prior to your scheduled appointment. If you have an Apple Watch or Kardia, please upload any pertinent ECG strips the day before or morning of your appointment to [place]. Our staff will also make sure you have reviewed the consent and agree to move forward with your scheduled tele-health visit.     THE DAY OF YOUR APPOINTMENT  Approximately 15 minutes prior to your scheduled appointment, you will receive a telephone call from one of [place] team - your caller ID may say "Unknown caller."  Our staff will confirm medications, vital signs for the day and any symptoms you may be experiencing. Please have this information available prior to the time of visit start. It may also be helpful for you to have a pad of paper and pen handy for any instructions given during your visit. They will also walk you through joining the WebEx smartphone meeting if this is a video visit.    CONSENT FOR TELE-HEALTH VISIT - PLEASE REVIEW  I hereby voluntarily request, consent and authorize [facility] and its employed or contracted physicians, physician assistants, nurse practitioners or other licensed health care professionals (the Practitioner), to provide me  with telemedicine health care services (the "Services") as deemed necessary by the treating Practitioner. I acknowledge and consent to receive the Services by the Practitioner via telemedicine. I understand that the telemedicine visit will involve communicating with the Practitioner through live audiovisual communication technology and the disclosure of certain medical information by electronic transmission. I acknowledge that I have been given the opportunity to  request an in-person assessment or other available alternative prior to the telemedicine visit and am voluntarily participating in the telemedicine visit.  I understand that I have the right to withhold or withdraw my consent to the use of telemedicine in the course of my care at any time, without affecting my right to future care or treatment, and that the Practitioner or I may terminate the telemedicine visit at any time. I understand that I have the right to inspect all information obtained and/or recorded in the course of the telemedicine visit and may receive copies of available information for a reasonable fee.  I understand that some of the potential risks of receiving the Services via telemedicine include:  Marland Kitchen Delay or interruption in medical evaluation due to technological equipment failure or disruption; . Information transmitted may not be sufficient (e.g. poor resolution of images) to allow for appropriate medical decision making by the Practitioner; and/or  . In rare instances, security protocols could fail, causing a breach of personal health information.  Furthermore, I acknowledge that it is my responsibility to provide information about my medical history, conditions and care that is complete and accurate to the best of my ability. I acknowledge that Practitioner's advice, recommendations, and/or decision may be based on factors not within their control, such as incomplete or inaccurate data provided by me or distortions of diagnostic images or specimens that may result from electronic transmissions. I understand that the practice of medicine is not an exact science and that Practitioner makes no warranties or guarantees regarding treatment outcomes. I acknowledge that I will receive a copy of this consent concurrently upon execution via email to the email address I last provided but may also request a printed copy by calling the office of [facility].    I understand that my insurance  will be billed for this visit.   I have read or had this consent read to me. . I understand the contents of this consent, which adequately explains the benefits and risks of the Services being provided via telemedicine.  . I have been provided ample opportunity to ask questions regarding this consent and the Services and have had my questions answered to my satisfaction. . I give my informed consent for the services to be provided through the use of telemedicine in my medical care  By participating in this telemedicine visit I agree to the above. YES

## 2019-02-27 ENCOUNTER — Encounter: Payer: Self-pay | Admitting: Cardiovascular Disease

## 2019-02-27 ENCOUNTER — Other Ambulatory Visit: Payer: Self-pay

## 2019-02-27 ENCOUNTER — Telehealth (INDEPENDENT_AMBULATORY_CARE_PROVIDER_SITE_OTHER): Payer: BLUE CROSS/BLUE SHIELD | Admitting: Cardiovascular Disease

## 2019-02-27 VITALS — BP 126/76 | HR 60 | Ht 69.0 in | Wt 228.0 lb

## 2019-02-27 DIAGNOSIS — I25119 Atherosclerotic heart disease of native coronary artery with unspecified angina pectoris: Secondary | ICD-10-CM | POA: Diagnosis not present

## 2019-02-27 NOTE — Progress Notes (Signed)
Virtual Visit via Video Note   This visit type was conducted due to national recommendations for restrictions regarding the COVID-19 Pandemic (e.g. social distancing) in an effort to limit this patient's exposure and mitigate transmission in our community.  Due to his co-morbid illnesses, this patient is at least at moderate risk for complications without adequate follow up.  This format is felt to be most appropriate for this patient at this time.  All issues noted in this document were discussed and addressed.  A limited physical exam was performed with this format.  Please refer to the patient's chart for his consent to telehealth for [facility].   Evaluation Performed:  Follow-up visit  Date:  02/27/2019   ID:  Chase Scott, Chase Scott Feb 12, 1956, MRN [id number]  Patient Location: Home  Provider Location: Office  PCP:  Baxter Hire, MD  Cardiologist:  [name], MD  Electrophysiologist:  None   Chief Complaint:  fu  History of Present Illness:    Chase Scott is a 63 y.o. male who presents via audio/video conferencing for a telehealth visit today.    Chase Scott is a 64 y.o. male who presents for a follow-up visit regarding coronary artery disease.  He has known history of coronary artery disease status post CABG in 2004 with subsequent PCI of left circumflex and right coronary artery.  He also has chronic diastolic heart failure, hypertension and hyperlipidemia. He was hospitalized in January, 2019 with exertional chest tightness.  EKG showed possible old inferior infarct.  He ruled out for myocardial infarction by enzymes.  He underwent a nuclear stress test which showed evidence of prior infarct in the basal anterolateral and mid anterolateral location without significant reversibility.  EF was 43%.  The study was intermediate risk.  Echocardiogram showed an EF of 50% with grade 1 diastolic dysfunction. Cardiac catheterization showed significant underlying three-vessel  coronary artery disease with patent stents in the left circumflex and right coronary arteries.  There was patent LIMA to LAD and SVG to diagonal.  SVG to left circumflex was chronically occluded with atretic RIMA to RCA.  There was moderate ostial RCA stenosis with catheter-induced spasm that improved with nitroglycerin.  Left ventricular end-diastolic pressure was normal. He was treated medically.    He is known to have PVCs.  Holter monitor in April, 2019showed 28,000 PVC in 48 hours representing 18% burden.  Average heart rate was 71 bpm.     He has known history of hyperlipidemia with intolerance to statins.  He is tolerating Repatha with significant improvement in lipid profile.   Over the years, he had intermittent atypical chest pain which is hard to distinguish from cardiac or GERD.  He was hospitalized in February for this and had negative cardiac enzymes.  Her symptoms resolved after he was treated for GERD.  He is doing very well now with no chest pain or shortness of breath.  He has rare palpitations.  The patient does not have symptoms concerning for COVID-19 infection (fever, chills, cough, or new shortness of breath).    Past Medical History:  Diagnosis Date  . Alcoholism ([place])   . Anginal pain ([place])   . CAD (coronary artery disease)    a. 01/2003 s/p PCI to RCA;  b. 02/2003 s/ CABG x 4 (LIMA->LAD, RIMA->RCA, VG->Diag, VG->LCX; c. 2005 s/p PCI to OM2; d. 06/2012 Cath: LM nl, LAD 160m, LCX mild plaque, OM1 min irregs, OM2 patent stent, RCA 29m ISR, VG->LCX 100, VG->Diag ok,  RIMA->RCA 100, LIMA->LAD ok, EF 50-55%-->Med Rx.  . Chronic Chest pain   . Diastolic dysfunction    a. 11/2017 Echo: EF 50-55%, no rwma, Gr1 DD, mildly dil LA.  Marland Kitchen Dyslipidemia   . Gastroesophageal reflux disease   . Gastrointestinal symptoms    Chronic gastrointestinal symptoms  . History of kidney stones   . History of left shoulder fracture   . Hyperlipidemia   . Hypertension   . Hypertensive  cardiovascular disease    a. Labile BPs.  . Rotator cuff syndrome    Past Surgical History:  Procedure Laterality Date  . Arthroscopic Knee Surgery Right 2009  . Arthroscopic Knee Surgery Left 10/2017  . BACK SURGERY     Lower  . CARDIAC CATHETERIZATION    . COLONOSCOPY WITH PROPOFOL N/A 04/29/2018   Procedure: COLONOSCOPY WITH PROPOFOL;  Surgeon: Manya Silvas, MD;  Location[facility] END[facility]COPY;  Service: Endoscopy;  Laterality: N/A;  . CORONARY ARTERY BYPASS GRAFT  April 2004   x four  . ESOPHAGOGASTRODUODENOSCOPY (EGD) WITH PROPOFOL N/A 04/29/2018   Procedure: ESOPHAGOGASTRODUODENOSCOPY (EGD) WITH PROPOFOL;  Surgeon: Manya Silvas, MD;  Location[facility] ENDOSCOPY;  Service: Endoscopy;  Laterality: N/A;  . LEFT HEART CATH AND CORONARY ANGIOGRAPHY N/A 12/14/2017   Procedure: LEFT HEART CATH AND CORONARY ANGIOGRAPHY;  Surgeon: Wellington Hampshire, MD;  Location: [place] CV LAB;  Service: Cardiovascular;  Laterality: N/A;  . LEFT HEART CATHETERIZATION WITH CORONARY/GRAFT ANGIOGRAM  07/02/2012   Procedure: LEFT HEART CATHETERIZATION WITH Beatrix Fetters;  Surgeon: Burnell Blanks, MD;  Location: [facility] CATH LAB;  Service: Cardiovascular;;  . TONSILLECTOMY       Current Meds  Medication Sig  . aspirin EC 81 MG EC tablet Take 1 tablet (81 mg total) by mouth daily.  . clopidogrel (PLAVIX) 75 MG tablet Take 1 tablet (75 mg total) by mouth daily.  Marland Kitchen esomeprazole (NEXIUM) 20 MG capsule Take 20 mg by mouth as needed.  . famotidine (PEPCID) 20 MG tablet Take 1 tablet (20 mg total) by mouth at bedtime. (Patient taking differently: Take 20 mg by mouth 2 (two) times daily. )  . hyoscyamine (LEVSIN SL) 0.125 MG SL tablet Place 2 tablets (0.25 mg total) under the tongue every 4 (four) hours as needed (esophageal spasm).  . isosorbide mononitrate (IMDUR) 30 MG 24 hr tablet Take 1 tablet (30 mg total) by mouth 2 (two) times daily.  . nitroGLYCERIN (NITROSTAT) 0.4 MG SL tablet Place 1  tablet (0.4 mg total) under the tongue every 5 (five) minutes as needed for chest pain.  . pantoprazole (PROTONIX) 40 MG tablet Take 1 tablet (40 mg total) by mouth 2 (two) times daily before a meal. (Patient taking differently: Take 40 mg by mouth every morning. )  . ranolazine (RANEXA) 500 MG 12 hr tablet Take 500 mg by mouth 2 (two) times daily.  Marland Kitchen REPATHA SURECLICK 124 MG/ML SOAJ Inject 140 mg into the skin every 14 (fourteen) days.  . sucralfate (CARAFATE) 1 g tablet Take 1 g by mouth as needed.   . TOPROL XL 100 MG 24 hr tablet Take 150 mg by mouth daily.     Allergies:   Penicillins; Sulfa antibiotics; Etodolac; Lipitor [atorvastatin calcium]; Paxil [paroxetine hcl]; Statins; and Sulfasalazine   Social History   Tobacco Use  . Smoking status: Former Smoker    Packs/day: 1.00    Years: 15.00    Pack years: 15.00    Types: Cigarettes    Last attempt to quit:  12/13/2001    Years since quitting: 17.2  . Smokeless tobacco: Never Used  Substance Use Topics  . Alcohol use: No    Frequency: Never  . Drug use: No     Family Hx: The patient's family history includes Heart attack in his sister; Microcephaly in his sister; Other in his father and mother.  ROS:   Please see the history of present illness.      All other systems reviewed and are negative.   Prior CV studies:   The following studies were reviewed today:  I reviewed with him recent echocardiogram which showed an EF of 50 to 55%  Labs/Other Tests and Data Reviewed:    EKG:  No ECG reviewed.  Recent Labs: 03/15/2018: Magnesium 2.2; TSH 1.460 01/03/2019: ALT 19 01/05/2019: BUN 15; Creatinine, Ser 1.07; Hemoglobin 15.2; Platelets 213; Potassium 3.9; Sodium 137   Recent Lipid Panel Lab Results  Component Value Date/Time   CHOL 80 10/10/2018 09:20 AM   CHOL 180 03/15/2018 02:24 PM   TRIG 43 10/10/2018 09:20 AM   HDL 58 10/10/2018 09:20 AM   HDL 53 03/15/2018 02:24 PM   CHOLHDL 1.4 10/10/2018 09:20 AM   LDLCALC  13 10/10/2018 09:20 AM   LDLCALC 107 (H) 03/15/2018 02:24 PM    Wt Readings from Last 3 Encounters:  02/27/19 228 lb (103.4 kg)  01/03/19 230 lb (104.3 kg)  11/01/18 234 lb 8 oz (106.4 kg)     Objective:    Vital Signs:  BP 126/76   Pulse 60   Ht 5\' 9"  (1.753 m)   Wt 228 lb (103.4 kg)   BMI 33.67 kg/m    Well nourished, well developed male in no acute distress.   ASSESSMENT & PLAN:    1.  Coronary artery disease involving bypass graft with other forms of angina: He is currently doing very well.  I recommend continuing medical therapy.  Recent symptoms were likely GI in nature and he is planning to follow-up with gastroenterology in the next few months.   Cardiac catheterization in 2019  was reassuring although he does have moderate ostial RCA stenosis.  This can be interrogated with FFR in the future if there is a question.   2.  Essential hypertension: Blood pressure is controlled on current medications.  3.  Hyperlipidemia: Continue treatment with Repatha. He had significant improvement in LDL which decreased to 13.  4.  PVCs: He reports minimal palpitations at the present time with metoprolol and Ranexa.  5.  Obesity and physical deconditioning.  I discussed with him the importance of starting an exercise program and attempting weight loss.     COVID-19 Education: The signs and symptoms of COVID-19 were discussed with the patient and how to seek care for testing (follow up with PCP or arrange E-visit).  The importance of social distancing was discussed today.  Time:   Today, I have spent 22 minutes with the patient with telehealth technology discussing the above problems.     Medication Adjustments/Labs and Tests Ordered: Current medicines are reviewed at length with the patient today.  Concerns regarding medicines are outlined above.  Tests Ordered: No orders of the defined types were placed in this encounter.  Medication Changes: No orders of the defined  types were placed in this encounter.   Disposition:  Follow up in 4 month(s)  Signed, [name], MD  02/27/2019 9:08 AM    [facility]

## 2019-02-27 NOTE — Patient Instructions (Signed)
Medication Instructions:  Continue same medications If you need a refill on your cardiac medications before your next appointment, please call your pharmacy.   Lab work: None If you have labs (blood work) drawn today and your tests are completely normal, you will receive your results only by: . MyChart Message (if you have MyChart) OR . A paper copy in the mail If you have any lab test that is abnormal or we need to change your treatment, we will call you to review the results.  Testing/Procedures: None  Follow-Up: At [facility], you and your health needs are our priority.  As part of our continuing mission to provide you with exceptional heart care, we have created designated Provider Care Teams.  These Care Teams include your primary Cardiologist (physician) and Advanced Practice Providers (APPs -  Physician Assistants and Nurse Practitioners) who all work together to provide you with the care you need, when you need it. You will need a follow up appointment in 4 months.  Please call our office 2 months in advance to schedule this appointment.  You may see Muhammad Arida, MD or one of the following Advanced Practice Providers on your designated Care Team:   Christopher Berge, NP Ryan Dunn, PA-C . Jacquelyn Visser, PA-C  

## 2019-05-13 ENCOUNTER — Other Ambulatory Visit: Payer: Self-pay | Admitting: Cardiovascular Disease

## 2019-07-31 DIAGNOSIS — I10 Essential (primary) hypertension: Secondary | ICD-10-CM | POA: Diagnosis not present

## 2019-07-31 DIAGNOSIS — E7800 Pure hypercholesterolemia, unspecified: Secondary | ICD-10-CM | POA: Diagnosis not present

## 2019-07-31 DIAGNOSIS — I2510 Atherosclerotic heart disease of native coronary artery without angina pectoris: Secondary | ICD-10-CM | POA: Diagnosis not present

## 2019-07-31 DIAGNOSIS — K219 Gastro-esophageal reflux disease without esophagitis: Secondary | ICD-10-CM | POA: Diagnosis not present

## 2019-08-11 DIAGNOSIS — R0789 Other chest pain: Secondary | ICD-10-CM | POA: Diagnosis not present

## 2019-08-11 DIAGNOSIS — R079 Chest pain, unspecified: Secondary | ICD-10-CM | POA: Diagnosis not present

## 2019-08-11 DIAGNOSIS — I2510 Atherosclerotic heart disease of native coronary artery without angina pectoris: Secondary | ICD-10-CM | POA: Diagnosis not present

## 2019-08-11 DIAGNOSIS — K219 Gastro-esophageal reflux disease without esophagitis: Secondary | ICD-10-CM | POA: Diagnosis not present

## 2019-09-01 ENCOUNTER — Other Ambulatory Visit: Payer: Self-pay | Admitting: Cardiovascular Disease

## 2019-10-03 ENCOUNTER — Other Ambulatory Visit: Payer: Self-pay | Admitting: Cardiovascular Disease

## 2019-10-03 NOTE — Telephone Encounter (Signed)
Please schedule overdue F/U with Dr. Arida. Thank you! 

## 2019-10-06 NOTE — Telephone Encounter (Signed)
Left voicemail message requesting for patient to call back to schedule appointment. 

## 2019-10-09 ENCOUNTER — Ambulatory Visit: Payer: BLUE CROSS/BLUE SHIELD | Admitting: Nurse Practitioner

## 2019-10-24 ENCOUNTER — Encounter: Payer: Self-pay | Admitting: Nurse Practitioner

## 2019-10-24 ENCOUNTER — Other Ambulatory Visit: Payer: Self-pay

## 2019-10-24 ENCOUNTER — Ambulatory Visit (INDEPENDENT_AMBULATORY_CARE_PROVIDER_SITE_OTHER): Payer: BC Managed Care – PPO | Admitting: Physician Assistant

## 2019-10-24 VITALS — BP 130/82 | HR 106 | Ht 69.0 in | Wt 222.5 lb

## 2019-10-24 DIAGNOSIS — R0789 Other chest pain: Secondary | ICD-10-CM

## 2019-10-24 DIAGNOSIS — R0683 Snoring: Secondary | ICD-10-CM

## 2019-10-24 DIAGNOSIS — I25810 Atherosclerosis of coronary artery bypass graft(s) without angina pectoris: Secondary | ICD-10-CM

## 2019-10-24 DIAGNOSIS — I10 Essential (primary) hypertension: Secondary | ICD-10-CM | POA: Diagnosis not present

## 2019-10-24 DIAGNOSIS — I25708 Atherosclerosis of coronary artery bypass graft(s), unspecified, with other forms of angina pectoris: Secondary | ICD-10-CM

## 2019-10-24 DIAGNOSIS — I493 Ventricular premature depolarization: Secondary | ICD-10-CM

## 2019-10-24 DIAGNOSIS — E785 Hyperlipidemia, unspecified: Secondary | ICD-10-CM

## 2019-10-24 DIAGNOSIS — Z01810 Encounter for preprocedural cardiovascular examination: Secondary | ICD-10-CM

## 2019-10-24 DIAGNOSIS — Z8719 Personal history of other diseases of the digestive system: Secondary | ICD-10-CM

## 2019-10-24 MED ORDER — TOPROL XL 100 MG PO TB24: 200.0000 mg | ORAL_TABLET | Freq: Every day | ORAL | 0 refills | Status: DC

## 2019-10-24 NOTE — Progress Notes (Signed)
Office Visit    Patient Name: Chase Scott Date of Encounter: 10/24/2019  Primary Care Provider:  Baxter Hire, MD Primary Cardiologist:  [name], MD  Chief Complaint    63 year old male with history of CAD s/p CABG (2004) with subsequent PCI of the left circumflex and right coronary artery, chronic diastolic heart failure, PVCs, hypertension, and hyperlipidemia, and who is being seen today for 35-month follow-up and ongoing chest pain.  Past Medical History    Past Medical History:  Diagnosis Date   Alcoholism ([place])    Anginal pain ([place])    CAD (coronary artery disease)    a. 01/2003 s/p PCI to RCA;  b. 02/2003 s/ CABG x 4 (LIMA->LAD, RIMA->RCA, VG->Diag, VG->LCX; c. 2005 s/p PCI to OM2; d. 06/2012 Cath: LM nl, LAD 170m, LCX mild plaque, OM1 min irregs, OM2 patent stent, RCA 69m ISR, VG->LCX 100, VG->Diag ok, RIMA->RCA 100, LIMA->LAD ok, EF 50-55%-->Med Rx.   Chronic Chest pain    Diastolic dysfunction    a. 11/2017 Echo: EF 50-55%, no rwma, Gr1 DD, mildly dil LA.   Dyslipidemia    Gastroesophageal reflux disease    Gastrointestinal symptoms    Chronic gastrointestinal symptoms   History of kidney stones    History of left shoulder fracture    Hyperlipidemia    Hypertension    Hypertensive cardiovascular disease    a. Labile BPs.   Rotator cuff syndrome    Past Surgical History:  Procedure Laterality Date   Arthroscopic Knee Surgery Right 2009   Arthroscopic Knee Surgery Left 10/2017   BACK SURGERY     Lower   CARDIAC CATHETERIZATION     COLONOSCOPY WITH PROPOFOL N/A 04/29/2018   Procedure: COLONOSCOPY WITH PROPOFOL;  Surgeon: Manya Silvas, MD;  [facility] ENDOSCOPY;  Service: Endoscopy;  Laterality: N/A;   CORONARY ARTERY BYPASS GRAFT  April 2004   x four   ESOPHAGOGASTRODUODENOSCOPY (EGD) WITH PROPOFOL N/A 04/29/2018   Procedure: ESOPHAGOGASTRODUODENOSCOPY (EGD) WITH PROPOFOL;  Surgeon: Manya Silvas, MD;  Location: [facility]  ENDOSCOPY;  Service: Endoscopy;  Laterality: N/A;   LEFT HEART CATH AND CORONARY ANGIOGRAPHY N/A 12/14/2017   Procedure: LEFT HEART CATH AND CORONARY ANGIOGRAPHY;  Surgeon: Wellington Hampshire, MD;  Location: [facility] LAB;  Service: Cardiovascular;  Laterality: N/A;   LEFT HEART CATHETERIZATION WITH CORONARY/GRAFT ANGIOGRAM  07/02/2012   Procedure: LEFT HEART CATHETERIZATION WITH Beatrix Fetters;  Surgeon: Burnell Blanks, MD;  Location: [facility] CATH LAB;  Service: Cardiovascular;;   TONSILLECTOMY      Allergies  Allergies  Allergen Reactions   Penicillins Anaphylaxis   Sulfa Antibiotics Anaphylaxis   Etodolac    Lipitor [Atorvastatin Calcium]     Myalgias @ 80 mg   Paxil [Paroxetine Hcl]    Statins     Myalgias on high dose lipitor and moderate dose crestor.   Sulfasalazine     History of Present Illness    63 year old male with PMH as above and including CAD s/p CABG with subsequent PCI.  He was seen 11/2017 with exertional chest tightness.  EKG showed possible old inferior infarct.  Troponin negative.  Subsequent stress test showed evidence of prior infarct in the basal anterolateral and mid anterolateral location without significant reversibility.  EF 43%.  Study ruled intermediate risk.  Echo showed EF 50% with G1 DD.  Cardiac catheterization showed significant underlying three-vessel CAD with patent stents in the left circumflex and right coronary artery.  There was patent LIMA  to LAD and SVG to diagonal.  SVG to left circumflex is chronically occluded with atretic RIMA to RCA.  There is moderate ostial RCA stenosis with catheter induced spasm that improved with nitroglycerin.  LVEDP normal.  He was treated medically.  He is known to have PVCs with Holter monitor 02/2018 showing 28,000 PVCs in 48 hours and 18% burden with an average heart rate of 71 bpm. He has had minimal symptoms on BB and Ranexa.  He also has a known history of hyperlipidemia with intolerance  to statins.  He is tolerating Repatha with improvement in his lipids. At his last appointment 02/2019, and given his recurrent CP with symptoms GI in nature, he was advised to follow-up with GI. Lifestyle changes were also encouraged. Of note, patient has been evaluated by GI in the past with 2007 colonoscopy notable for hyperplastic polyps, EGD 04/2018 with mild chronic gastritis and intestinal metaplasia, and 12/2018 RUQ ultrasound with probable hepatic steatosis.    He was most recently evaluated by GI 07/2019, with documentation noting it was felt that the patient had symptoms consistent with GERD and recommendation was to stop Nexium and Carafate and start Dexilant daily. It was also recommended he is famotidine 20 mg at bedtime or as needed for breakthrough symptoms.  Plan was to have the patient follow-up with cardiology to rule out cardiac etiology and obtain clearance for further esophagus testing, including esophageal manometry and PH monitoring; however, during today's appointment and at clinic today, the patient reported his impression from the visit was GI felt strongly the issue was cardiac. He remained on Nexium and reported the only medication changes suggested by GI were to trial Protonix, which was not helpful for him.  Since that time, the patient has continued to note intermittent atypical chest pain, described as if there is something inflamed inside of him.  He reports that this atypical chest pain is made worse with certain foods and improved with elevating the head of his bed at night.  He states the pain is constant and 8/10 at its worst and 1/10 at its best but never completely gone. He reports improvement over the last couple of days, with CP currently 1/10. The pain occurs at both rest and with exertion, though it sometimes improves with exertion. No associated diaphoresis, SOB, racing HR, or symptoms of palpitations (with history of frequent PVCs on previous monitor). No recent LEE,  abdominal distention. He does report that he snores at times and wakes up SOB with his wife encouraging him to get a sleep study. He denies any current tobacco (quit 2003) or substance abuse.  He notes recent increase in mild headache, attributed to allergies. He also reported that his nitrates may be contributing to his headache, stating he had a headache when his Imdur was increased to 60mg  in the past. No alcohol use since approximatelty 9 years ago. He eats healthy but does admit to eating heavy sweets since quitting alcohol. He continued to take Plavix and ASA without s/sx of bleeding. He checks his BP at home with SBP usually in the 130s and DBP usually in the 80s.   Home Medications    Prior to Admission medications   Medication Sig Start Date End Date Taking? Authorizing Provider  Ascorbic Acid (VITAMIN C) 1000 MG tablet Take 1,000 mg by mouth daily.   Yes [provider]  aspirin EC 81 MG EC tablet Take 1 tablet (81 mg total) by mouth daily. 12/15/17  Yes Salary, Montell D,  MD  clopidogrel (PLAVIX) 75 MG tablet Take 1 tablet (75 mg total) by mouth daily. 08/23/18  Yes Wellington Hampshire, MD  esomeprazole (NEXIUM) 20 MG capsule Take 40 mg by mouth at bedtime.    Yes [provider]  famotidine (PEPCID) 20 MG tablet Take 1 tablet (20 mg total) by mouth at bedtime. Patient taking differently: Take 20 mg by mouth as needed.  01/05/19  Yes Mayo, Pete Pelt, MD  hyoscyamine (LEVSIN SL) 0.125 MG SL tablet Place 2 tablets (0.25 mg total) under the tongue every 4 (four) hours as needed (esophageal spasm). 01/05/19  Yes Mayo, Pete Pelt, MD  isosorbide mononitrate (IMDUR) 30 MG 24 hr tablet TAKE 1 TABLET DAILY 05/13/19  Yes Wellington Hampshire, MD  Magnesium 250 MG TABS Take by mouth daily.   Yes [provider]  nitroGLYCERIN (NITROSTAT) 0.4 MG SL tablet Place 1 tablet (0.4 mg total) under the tongue every 5 (five) minutes as needed for chest pain. 01/05/19  Yes Mayo, Pete Pelt, MD    ranolazine (RANEXA) 500 MG 12 hr tablet TAKE 1 TABLET TWICE A DAY 05/13/19  Yes Wellington Hampshire, MD  REPATHA SURECLICK [id number] MG/ML SOAJ INJECT ONE SYRINGEFUL INTO THE SKIN EVERY 14 DAYS (NEEDS OFFICE VISIT FOR FURTHER REFILLS) 10/07/19  Yes Wellington Hampshire, MD  sucralfate (CARAFATE) 1 g tablet Take 1 g by mouth as needed.    Yes [provider]  TOPROL XL 100 MG 24 hr tablet Take 150 mg by mouth daily. 10/30/17  Yes [provider]    Review of Systems    He denies palpitations, dyspnea, pnd, orthopnea, n, v, dizziness, syncope, edema, weight gain, or early satiety. He reports atypical chest pain, unchanged from previous visits. He also reports snoring and waking himself from sleep.  All other systems reviewed and are otherwise negative except as noted above.  Physical Exam    VS:  BP 130/82 (BP Location: Left Arm, Patient Position: Sitting, Cuff Size: Normal)    Pulse (!) 106    Ht 5\' 9"  (1.753 m)    Wt 222 lb 8 oz (100.9 kg)    BMI 32.86 kg/m  , BMI Body mass index is 32.86 kg/m. GEN: Well nourished, well developed, in no acute distress. HEENT: normal. Neck: Supple, no JVD, carotid bruits, or masses. Cardiac: tachycardic, regular rhythm, no murmurs, rubs, or gallops. No clubbing, cyanosis, edema.  Radials/DP/PT 2+ and equal bilaterally.  Respiratory:  Respirations regular and unlabored, clear to auscultation bilaterally. GI: Soft, nontender, nondistended, BS + x 4. MS: no deformity or atrophy. Skin: warm and dry, no rash. Neuro:  Strength and sensation are intact. Psych: Normal affect.  Accessory Clinical Findings    ECG personally reviewed by me today - ST 106 bpm with PVCs - no acute changes.  02/2019 Echo  1. The left ventricle has low normal systolic function, with an ejection fraction of 50-55%. The cavity size was normal. Left ventricular diastolic Doppler parameters are consistent with pseudonormalization.  2. The right ventricle has normal systolic  function. The cavity was normal. There is no increase in right ventricular wall thickness.  3. Right atrial size was mildly dilated.  4. No evidence of mitral valve stenosis.  5. The aortic valve is tricuspid.  6. The aortic root and ascending aorta are normal in size and structure.  7. The interatrial septum was not well visualized.  02/2018 Holter  The patient was monitored for 48 hours. 26% of the monitoring  period was classified as noise.  The predominant rhythm was sinus with an average rate of 71 bpm (range 52 to 140 bpm. The longest R-R interval was 1.6 seconds.  Rare supraventricular ectopy was noted. There was frequent ventricular ectopy with isolated PVCs as well as couplets and brief ventricular runs lasting up to 5 beats. Total PVC burden was 18%.  No sustained arrhythmia or prolonged pause was identified.  11/2017 LHC  Mid Cx lesion is 30% stenosed.  Ost LAD to Prox LAD lesion is 80% stenosed.  Prox LAD lesion is 100% stenosed.  Prox RCA to Mid RCA lesion is 20% stenosed.  LIMA graft was visualized by angiography.  The graft exhibits mild focal disease.  Origin lesion is 30% stenosed.  SVG graft was visualized by angiography.  Origin to Prox Graft lesion is 100% stenosed.  Origin lesion is 100% stenosed.  LIMA graft was visualized by angiography and is normal in caliber.  The graft exhibits minimal luminal irregularities.  Ost RCA lesion is 50% stenosed. 1.  Significant underlying three-vessel coronary artery disease with patent stents in the left circumflex and right coronary arteries.  Patent LIMA to LAD and SVG to diagonal.  Known chronically occluded SVG to left circumflex and atretic RIMA to RCA (not needed given no obstructive disease in native vessels).  Moderate ostial RCA stenosis with catheter-induced spasm that improved with nitroglycerin. 2.  Normal left ventricular end-diastolic pressure. Recommendations: Overall, no significant change in  coronary anatomy since most recent cardiac catheterization.  Recommend continuing medical therapy.  I added Imdur for symptomatic relief.  If the patient continues to have significant dyspnea, outpatient pulmonary evaluation might be needed.  Assessment & Plan    Atypical CP with history of GERD and CAD involving CABG --No change in his atypical chest pain. Recently evaluated by GI with recommendation from GI for further workup. Recent echo with nl EF. Cardiac catheterization in 2019 reassuring though moderate ostial RCA stenosis. It has been noted that future considerations could include FFR if needed, as well as pulmonology and GI evaluation.  --Per review of GI note, preop evaluation was recommended before further evaluation with recommendations as below. Recommend further GI evaluation given his CP seems consistent with GERD/gastritis with relief when elevating the head of his bed and worsening sx dependent on food. He also has history of gastritis and hepatic steatosis as outlined above.  --Recommend pulmonology evaluation, including sleep study as below for snoring. --Given patient's headache with increased dose of Imdur, will defer increase of Imdur for further attempt at BP and symptom control and increase Toprol as below.  Preoperative Evaluation, esophageal manometry and pH monitoring History of GERD --CP atypical and unchanged from previous episodes, thought to be GI in etiology. --Low risk procedure of esophageal manometry and pH monitoring. Functional capacity 4-10 METS. MACE less than 1% risk. RCRI Class 1. Previous Echo and cath as above with recommendation for continued medical therapy. Recent CXR by GI showing no acute changes. EKG without acute ST/T changes today.  --Reasonable to proceed with procedure without additional cardiac testing or intervention.  Recommend discontinuing clopidogrel 7 days prior to surgery and continuation of aspirin 81 mg daily unless otherwise indicated by  GI at that time. Reinitiation of clopidogrel (and ASA if GI holds before procedure) should be done as soon as it is felt safe to do so after surgery.  HTN --Sub-optimal with patient reporting home BP findings consistent with today's BP. --Increase Toprol XL to 200mg  daily given  plenty of room in HR / ST. Continue with Imdur, which was not increased due to patient report of HA.   HLD --Statin intolerant. Continue Repatha.  PVCs --Minimal to no symptoms with PVCs, as previously noted. Holter as above in 2019 with frequent PVCs noted and seen today on EKG. Continue increased dose Toprol XL with Ranexa.  Snoring, Possible OSA --Sleep study recommended with referral to pulmonology today.  Disposition: Referral to pulmonology for sleep study.  Increase metoprolol succinate to 200 mg daily.  Follow-up with GI for further evaluation with preoperative cardiac evaluation as detailed above and chart sent to [facility]. Follow-up 6 months or sooner if new or concerning symptoms.   Arvil Chaco, PA-C 10/24/2019, 10:39 AM

## 2019-10-24 NOTE — Patient Instructions (Signed)
Medication Instructions:  Your physician has recommended you make the following change in your medication:   INCREASE Metoprolol to 200 mg daily. An Rx has been sent to your local pharmacy.  *If you need a refill on your cardiac medications before your next appointment, please call your pharmacy*  Lab Work: None ordered If you have labs (blood work) drawn today and your tests are completely normal, you will receive your results only by: Marland Kitchen MyChart Message (if you have MyChart) OR . A paper copy in the mail If you have any lab test that is abnormal or we need to change your treatment, we will call you to review the results.  Testing/Procedures: None ordered  Follow-Up: At [facility], you and your health needs are our priority.  As part of our continuing mission to provide you with exceptional heart care, we have created designated Provider Care Teams.  These Care Teams include your primary Cardiologist (physician) and Advanced Practice Providers (APPs -  Physician Assistants and Nurse Practitioners) who all work together to provide you with the care you need, when you need it.  Your next appointment:   6 month(s) follow up sooner if symptoms develop  The format for your next appointment:   In Person  Provider:    You may see [name], MD or one of the following Advanced Practice Providers on your designated Care Team:    Murray Hodgkins, NP  Christell Faith, PA-C  Marrianne Mood, PA-C   Other Instructions You have been referred to [facility]. Their office will contact you to schedule.

## 2019-11-01 ENCOUNTER — Other Ambulatory Visit: Payer: Self-pay | Admitting: Cardiovascular Disease

## 2019-12-10 ENCOUNTER — Institutional Professional Consult (permissible substitution): Payer: BC Managed Care – PPO | Admitting: Pulmonary Disease

## 2020-01-29 ENCOUNTER — Encounter: Payer: Self-pay | Admitting: Pulmonary Disease

## 2020-01-29 ENCOUNTER — Other Ambulatory Visit: Payer: Self-pay

## 2020-01-29 ENCOUNTER — Ambulatory Visit (INDEPENDENT_AMBULATORY_CARE_PROVIDER_SITE_OTHER): Payer: BC Managed Care – PPO | Admitting: Pulmonary Disease

## 2020-01-29 VITALS — BP 116/80 | HR 94 | Temp 98.2°F | Ht 69.0 in | Wt 225.0 lb

## 2020-01-29 DIAGNOSIS — R0683 Snoring: Secondary | ICD-10-CM | POA: Diagnosis not present

## 2020-01-29 NOTE — Patient Instructions (Signed)
Will arrange for home sleep study Will call to arrange for follow up after sleep study reviewed  

## 2020-01-29 NOTE — Progress Notes (Signed)
[facility]  Chief Complaint  Patient presents with  . Sleep Consult    Referred by Marrianne Mood, PA.  Pt c/o snoring and trouble sleeping for years. Epworth score = 11.     Constitutional:  BP 116/80 (BP Location: Left Arm, Cuff Size: Normal)   Pulse 94   Temp 98.2 F (36.8 C) (Oral)   Ht 5\' 9"  (1.753 m)   Wt 225 lb (102.1 kg)   SpO2 96% Comment: on RA  BMI 33.23 kg/m   Past Medical History:  Fatty liver, CAD s/p CABG and PCI, Diastolic CHF, HTN, HLD, ETOH, GERD, Nephrolithiasis  Brief Summary:  Chase Scott is a 64 y.o. male former smoker with snoring.  Followed by cardiology.  Concern about his sleep pattern.  Advised to get sleep consult.  His wife has sleep apnea and has been concerned about his breathing pattern at night.  He snores, and will stop breathing at times.  He sometimes wakes up feeling like he can't catch his breath.  He goes to sleep at 11 pm.  He falls asleep in about 15 to 30 minutes.  He wakes up 2 or 3 times to use the bathroom, and then can have trouble falling back to sleep.  He gets out of bed at 7 am.  He feels tired in the morning.  He denies morning headache.  He does not use anything to help him fall sleep or stay awake.  Can fall asleep easily if he is sitting quiet.  He denies sleep walking, sleep talking, bruxism, or nightmares.  There is no history of restless legs.  He denies sleep hallucinations, sleep paralysis, or cataplexy.  The Epworth score is 11 out of 24.   Physical Exam:   Appearance - well kempt   ENMT - clear nasal mucosa, midline nasal  septum, no oral exudates, no LAN, trachea midline  Respiratory - normal chest wall, normal respiratory effort, no accessory muscle use, no wheeze/rales  CV - s1s2 regular rate and rhythm, no murmurs, no peripheral edema, radial pulses symmetric  GI - soft, non tender, no masses  Lymph - no adenopathy noted in neck and axillary areas  MSK -  normal gait  Ext - no cyanosis, clubbing, or joint inflammation noted  Skin - no rashes, lesions, or ulcers  Neuro - normal strength, oriented x 3  Psych - normal mood and affect  Discussion:  He has snoring, sleep disruption, apnea, and daytime sleepiness.  He has history of CAD and hypertension.  I am concerned he could have obstructive sleep apnea.  Assessment/Plan:   Snoring with excessive daytime sleepiness. - will need to arrange for a home sleep study  Obesity. - discussed how weight can impact sleep and risk for sleep disordered breathing - discussed options to assist with weight loss: combination of diet modification, cardiovascular and strength training exercises  Cardiovascular risk. - had an extensive discussion regarding the adverse health consequences related to untreated sleep disordered breathing - specifically discussed the risks for hypertension, coronary artery disease, cardiac dysrhythmias, cerebrovascular disease, and diabetes - lifestyle modification discussed  Safe driving practices. - discussed how sleep disruption can increase risk of accidents, particularly when driving - safe driving practices were discussed  Therapies for obstructive sleep apnea. - if the sleep study shows significant sleep apnea, then various therapies for treatment were reviewed: CPAP, oral appliance, and surgical interventions   Patient Instructions  Will arrange for home sleep study Will  call to arrange for follow up after sleep study reviewed   Time spent 32 minutes  Chesley Mires, MD [place] Pager: [phone] 01/29/2020, 11:03 AM  Flow Sheet    Sleep tests:    Cardiac tests:  Echo 02/25/19 >> EF 5o to 55%  Medications:   Allergies as of 01/29/2020      Reactions   Penicillins Anaphylaxis   Sulfa Antibiotics Anaphylaxis   Etodolac    Lipitor [atorvastatin Calcium]    Myalgias @ 80 mg   Paxil [paroxetine Hcl]    Statins    Myalgias  on high dose lipitor and moderate dose crestor.   Sulfasalazine       Medication List       Accurate as of January 29, 2020 11:03 AM. If you have any questions, ask your nurse or doctor.        STOP taking these medications   famotidine 20 MG tablet Commonly known as: PEPCID Stopped by: Chesley Mires, MD   hyoscyamine 0.125 MG SL tablet Commonly known as: LEVSIN SL Stopped by: Chesley Mires, MD     TAKE these medications   aspirin 81 MG EC tablet Take 1 tablet (81 mg total) by mouth daily.   clopidogrel 75 MG tablet Commonly known as: PLAVIX Take 1 tablet (75 mg total) by mouth daily.   esomeprazole 20 MG capsule Commonly known as: NEXIUM Take 40 mg by mouth at bedtime.   isosorbide mononitrate 30 MG 24 hr tablet Commonly known as: IMDUR TAKE 1 TABLET DAILY   Magnesium 250 MG Tabs Take by mouth daily.   metoprolol succinate 100 MG 24 hr tablet Commonly known as: TOPROL-XL Take 150 mg by mouth daily. Take with or immediately following a meal. What changed: Another medication with the same name was removed. Continue taking this medication, and follow the directions you see here. Changed by: Chesley Mires, MD   nitroGLYCERIN 0.4 MG SL tablet Commonly known as: NITROSTAT Place 1 tablet (0.4 mg total) under the tongue every 5 (five) minutes as needed for chest pain.   ranolazine 500 MG 12 hr tablet Commonly known as: RANEXA TAKE 1 TABLET TWICE A DAY   Repatha SureClick [id number] MG/ML Soaj Generic drug: Evolocumab INJECT ONE SYRINGEFUL INTO THE SKIN EVERY 14 DAYS (NEEDS OFFICE VISIT FOR FURTHER REFILLS)   sucralfate 1 g tablet Commonly known as: CARAFATE Take 1 g by mouth as needed.   vitamin C 1000 MG tablet Take 1,000 mg by mouth daily.       Past Surgical History:  He  has a past surgical history that includes Coronary artery bypass graft (April 2004); Back surgery; left heart catheterization with coronary/graft angiogram (07/02/2012); Arthroscopic Knee Surgery  (Right, 2009); Arthroscopic Knee Surgery (Left, 10/2017); LEFT HEART CATH AND CORONARY ANGIOGRAPHY (N/A, 12/14/2017); Cardiac catheterization; Tonsillectomy; Esophagogastroduodenoscopy (egd) with propofol (N/A, 04/29/2018); and Colonoscopy with propofol (N/A, 04/29/2018).  Family History:  His family history includes Heart attack in his sister; Microcephaly in his sister; Other in his father and mother.  Social History:  He  reports that he quit smoking about 20 years ago. His smoking use included cigarettes. He has a 15.00 pack-year smoking history. He has never used smokeless tobacco. He reports that he does not drink alcohol or use drugs.

## 2020-02-20 ENCOUNTER — Other Ambulatory Visit: Payer: Self-pay | Admitting: Cardiovascular Disease

## 2020-03-19 ENCOUNTER — Other Ambulatory Visit: Payer: Self-pay | Admitting: Cardiovascular Disease

## 2020-04-04 ENCOUNTER — Other Ambulatory Visit: Payer: Self-pay | Admitting: Cardiovascular Disease

## 2020-04-05 ENCOUNTER — Ambulatory Visit: Payer: BC Managed Care – PPO

## 2020-04-05 ENCOUNTER — Other Ambulatory Visit: Payer: Self-pay

## 2020-04-05 DIAGNOSIS — R0683 Snoring: Secondary | ICD-10-CM

## 2020-04-05 DIAGNOSIS — G4733 Obstructive sleep apnea (adult) (pediatric): Secondary | ICD-10-CM | POA: Diagnosis not present

## 2020-04-09 ENCOUNTER — Telehealth: Payer: Self-pay | Admitting: Pulmonary Disease

## 2020-04-09 DIAGNOSIS — G4733 Obstructive sleep apnea (adult) (pediatric): Secondary | ICD-10-CM | POA: Diagnosis not present

## 2020-04-09 NOTE — Telephone Encounter (Signed)
HST 04/05/20 >> AHI 32.7, SpO2 low 81%   Please inform him that his sleep study shows severe obstructive sleep apnea.  Please arrange for ROV with me or NP to discuss treatment options.

## 2020-04-13 NOTE — Telephone Encounter (Signed)
Called and spoke with patient. He is aware of results and verbalized understanding. I did attempt to get him scheduled in [place] but the next available appt would not until mid July. Was able to get him scheduled with Aaron Edelman on 04/21/20 at 12pm. Provided him with the address to the [place] office. Nothing further needed at time of call.

## 2020-04-20 NOTE — Progress Notes (Signed)
@Patient  ID: Chase Scott, male    DOB: [birth year], 64 y.o.   MRN: [id number]  Chief Complaint  Patient presents with  . Follow-up    HST results-    Referring provider: Baxter Hire, MD  HPI:  64 year old male former smoker initially referred to our office for suspected obstructive sleep apnea  PMH: Hypertension, GERD, CAD, chest pain Smoker/ Smoking History: Former smoker.  Quit 2001.  15-pack-year smoking history. Maintenance: None Pt of: Dr. Halford Chessman  04/21/2020  - Visit   64 year old male former smoker initially referred to our office for suspected obstructive sleep apnea March/2021.  Patient presenting today to discuss home sleep study results.  He is followed by Dr. Halford Chessman.  Patient's home sleep study showed severe obstructive sleep apnea.  He is presenting today to discuss those results.  Questionaires / Pulmonary Flowsheets:   ACT:  No flowsheet data found.  MMRC: No flowsheet data found.  Epworth:  Results of the Epworth flowsheet 01/29/2020  Sitting and reading 2  Watching TV 2  Sitting, inactive in a public place (e.g. a theatre or a meeting) 1  As a passenger in a car for an hour without a break 1  Lying down to rest in the afternoon when circumstances permit 3  Sitting and talking to someone 0  Sitting quietly after a lunch without alcohol 2  In a car, while stopped for a few minutes in traffic 0  Total score 11    Tests:   Echo 02/25/19 >> EF 5o to 55%  04/05/2020-home sleep study-AHI 32.7, SaO2 low 81%  FENO:  No results found for: NITRICOXIDE  PFT: No flowsheet data found.  WALK:  No flowsheet data found.  Imaging: No results found.  Lab Results:  CBC    Component Value Date/Time   WBC 7.1 01/05/2019 0420   RBC 4.91 01/05/2019 0420   HGB 15.2 01/05/2019 0420   HCT 44.4 01/05/2019 0420   PLT 213 01/05/2019 0420   MCV 90.4 01/05/2019 0420   MCH 31.0 01/05/2019 0420   MCHC 34.2 01/05/2019 0420   RDW 12.0 01/05/2019 0420    LYMPHSABS 3.1 07/01/2012 2132   MONOABS 0.5 07/01/2012 2132   EOSABS 0.2 07/01/2012 2132   BASOSABS 0.0 07/01/2012 2132    BMET    Component Value Date/Time   NA 137 01/05/2019 0420   NA 139 03/15/2018 1424   K 3.9 01/05/2019 0420   CL 103 01/05/2019 0420   CO2 29 01/05/2019 0420   GLUCOSE 101 (H) 01/05/2019 0420   BUN 15 01/05/2019 0420   BUN 16 03/15/2018 1424   CREATININE 1.07 01/05/2019 0420   CALCIUM 8.7 (L) 01/05/2019 0420   GFRNONAA >60 01/05/2019 0420   GFRAA >60 01/05/2019 0420    BNP No results found for: BNP  ProBNP No results found for: PROBNP  Specialty Problems      Pulmonary Problems   Dyspnea on exertion   OSA (obstructive sleep apnea)    04/05/2020-home sleep study-AHI 32.7, SaO2 low 81%          Allergies  Allergen Reactions  . Penicillins Anaphylaxis  . Sulfa Antibiotics Anaphylaxis  . Etodolac   . Lipitor [Atorvastatin Calcium]     Myalgias @ 80 mg  . Paxil [Paroxetine Hcl]   . Statins     Myalgias on high dose lipitor and moderate dose crestor.  . Sulfasalazine      There is no immunization history on file for this patient.  Past Medical History:  Diagnosis Date  . Alcoholism ([place])   . Anginal pain ([place])   . CAD (coronary artery disease)    a. 01/2003 s/p PCI to RCA;  b. 02/2003 s/ CABG x 4 (LIMA->LAD, RIMA->RCA, VG->Diag, VG->LCX; c. 2005 s/p PCI to OM2; d. 06/2012 Cath: LM nl, LAD 170m, LCX mild plaque, OM1 min irregs, OM2 patent stent, RCA 43m ISR, VG->LCX 100, VG->Diag ok, RIMA->RCA 100, LIMA->LAD ok, EF 50-55%-->Med Rx.  . Chronic Chest pain   . Diastolic dysfunction    a. 11/2017 Echo: EF 50-55%, no rwma, Gr1 DD, mildly dil LA.  Marland Kitchen Dyslipidemia   . Gastroesophageal reflux disease   . Gastrointestinal symptoms    Chronic gastrointestinal symptoms  . History of kidney stones   . History of left shoulder fracture   . Hyperlipidemia   . Hypertension   . Hypertensive cardiovascular disease    a. Labile BPs.  . Rotator cuff  syndrome     Tobacco History: Social History   Tobacco Use  Smoking Status Former Smoker  . Packs/day: 1.00  . Years: 15.00  . Pack years: 15.00  . Types: Cigarettes  . Quit date: 11/21/1999  . Years since quitting: 20.4  Smokeless Tobacco Never Used   Counseling given: Yes   Continue to not smoke  Outpatient Encounter Medications as of 04/21/2020  Medication Sig  . Ascorbic Acid (VITAMIN C) 1000 MG tablet Take 1,000 mg by mouth daily.  Marland Kitchen aspirin EC 81 MG EC tablet Take 1 tablet (81 mg total) by mouth daily.  . clopidogrel (PLAVIX) 75 MG tablet Take 1 tablet (75 mg total) by mouth daily.  Marland Kitchen esomeprazole (NEXIUM) 20 MG capsule Take 40 mg by mouth at bedtime.   . isosorbide mononitrate (IMDUR) 30 MG 24 hr tablet TAKE 1 TABLET DAILY  . Magnesium 250 MG TABS Take by mouth daily.  . metoprolol succinate (TOPROL-XL) 100 MG 24 hr tablet Take 150 mg by mouth daily. Take with or immediately following a meal.  . nitroGLYCERIN (NITROSTAT) 0.4 MG SL tablet Place 1 tablet (0.4 mg total) under the tongue every 5 (five) minutes as needed for chest pain.  . ranolazine (RANEXA) 500 MG 12 hr tablet TAKE 1 TABLET TWICE A DAY  . REPATHA SURECLICK XX123456 MG/ML SOAJ INJECT 1 SYPRINEFUL INTO SKIN EVERY 14 DAYS              (NEEDS OFFICE VISIT FOR FURTHER REFILLS)  . sucralfate (CARAFATE) 1 g tablet Take 1 g by mouth as needed.    No facility-administered encounter medications on file as of 04/21/2020.     Review of Systems  Review of Systems  Constitutional: Negative for activity change, chills, fatigue, fever and unexpected weight change.  HENT: Negative for postnasal drip, rhinorrhea, sinus pressure, sinus pain and sore throat.   Eyes: Negative.   Respiratory: Negative for cough, shortness of breath and wheezing.   Cardiovascular: Negative for chest pain and palpitations.  Gastrointestinal: Negative for constipation, diarrhea, nausea and vomiting.  Endocrine: Negative.   Genitourinary: Negative.     Musculoskeletal: Negative.   Skin: Negative.   Neurological: Negative for dizziness and headaches.  Psychiatric/Behavioral: Negative.  Negative for dysphoric mood. The patient is not nervous/anxious.   All other systems reviewed and are negative.    Physical Exam  BP (!) 144/86 (BP Location: Left Arm, Cuff Size: Normal)   Pulse 61   Temp 98.3 F (36.8 C) (Oral)   Ht 5\' 9"  (1.753 m)  Wt 225 lb 9.6 oz (102.3 kg)   SpO2 97%   BMI 33.32 kg/m   Wt Readings from Last 5 Encounters:  04/21/20 225 lb 9.6 oz (102.3 kg)  01/29/20 225 lb (102.1 kg)  10/24/19 222 lb 8 oz (100.9 kg)  02/27/19 228 lb (103.4 kg)  01/03/19 230 lb (104.3 kg)    BMI Readings from Last 5 Encounters:  04/21/20 33.32 kg/m  01/29/20 33.23 kg/m  10/24/19 32.86 kg/m  02/27/19 33.67 kg/m  01/03/19 33.97 kg/m     Physical Exam Vitals and nursing note reviewed.  Constitutional:      General: He is not in acute distress.    Appearance: Normal appearance. He is obese.  HENT:     Head: Normocephalic and atraumatic.     Right Ear: Hearing and external ear normal.     Left Ear: Hearing and external ear normal.     Nose: No mucosal edema.     Right Turbinates: Not enlarged.     Left Turbinates: Not enlarged.  Cardiovascular:     Rate and Rhythm: Normal rate and regular rhythm.     Pulses: Normal pulses.     Heart sounds: Normal heart sounds. No murmur.  Pulmonary:     Effort: Pulmonary effort is normal.     Breath sounds: Normal breath sounds. No decreased breath sounds, wheezing or rales.  Musculoskeletal:     Cervical back: Normal range of motion.     Right lower leg: No edema.     Left lower leg: No edema.  Lymphadenopathy:     Cervical: No cervical adenopathy.  Skin:    General: Skin is warm and dry.     Capillary Refill: Capillary refill takes less than 2 seconds.     Findings: No erythema or rash.  Neurological:     General: No focal deficit present.     Mental Status: He is alert and  oriented to person, place, and time.     Motor: No weakness.     Coordination: Coordination normal.     Gait: Gait is intact. Gait normal.  Psychiatric:        Mood and Affect: Mood normal.        Behavior: Behavior normal. Behavior is cooperative.        Thought Content: Thought content normal.        Judgment: Judgment normal.       Assessment & Plan:   OSA (obstructive sleep apnea) May/2021 Home sleep study shows severe obstructive sleep apnea  Discussion: Discussed inspire device, oral appliance, CPAP.  We will proceed forward with CPAP therapy.  Patient spouse also uses CPAP therapy.  Patient would like to establish with a DME company.  Based off of cost may decide on purchasing CPAP however right with her [email or web address].  Plan: Start CPAP therapy APAP setting 5-15 Mask of choice Follow-up in 2 months    Return in about 2 months (around 06/21/2020), or if symptoms worsen or fail to improve, for [facility], Follow up with Wyn Quaker FNP-C, Follow up with Dr. Halford Chessman.   Lauraine Rinne, NP 04/21/2020   This appointment required 31 minutes of patient care (this includes precharting, chart review, review of results, face-to-face care, etc.).

## 2020-04-21 ENCOUNTER — Ambulatory Visit: Payer: BC Managed Care – PPO | Admitting: Pulmonary Disease

## 2020-04-21 ENCOUNTER — Other Ambulatory Visit: Payer: Self-pay

## 2020-04-21 ENCOUNTER — Encounter: Payer: Self-pay | Admitting: Pulmonary Disease

## 2020-04-21 DIAGNOSIS — G4733 Obstructive sleep apnea (adult) (pediatric): Secondary | ICD-10-CM | POA: Diagnosis not present

## 2020-04-21 NOTE — Patient Instructions (Addendum)
You were seen today by Lauraine Rinne, NP  for:   1. OSA (obstructive sleep apnea)  New CPAP Start  APAP 5-15 Mask of choice  DME [employer]    We recommend that you continue using your CPAP daily >>>Keep up the hard work using your device >>> Goal should be wearing this for the entire night that you are sleeping, at least 4 to 6 hours  Remember:  . Do not drive or operate heavy machinery if tired or drowsy.  . Please notify the supply company and office if you are unable to use your device regularly due to missing supplies or machine being broken.  . Work on maintaining a healthy weight and following your recommended nutrition plan  . Maintain proper daily exercise and movement  . Maintaining proper use of your device can also help improve management of other chronic illnesses such as: Blood pressure, blood sugars, and weight management.   BiPAP/ CPAP Cleaning:  >>>Clean weekly, with Dawn soap, and bottle brush.  Set up to air dry. >>> Wipe mask out daily with wet wipe or towelette    Follow Up:    Return in about 2 months (around 06/21/2020), or if symptoms worsen or fail to improve, for [facility], Follow up with Wyn Quaker FNP-C, Follow up with Dr. Halford Chessman.   Please do your part to reduce the spread of COVID-19:      Reduce your risk of any infection  and COVID19 by using the similar precautions used for avoiding the common cold or flu:  Marland Kitchen Wash your hands often with soap and warm water for at least 20 seconds.  If soap and water are not readily available, use an alcohol-based hand sanitizer with at least 60% alcohol.  . If coughing or sneezing, cover your mouth and nose by coughing or sneezing into the elbow areas of your shirt or coat, into a tissue or into your sleeve (not your hands). Langley Gauss A MASK when in public  . Avoid shaking hands with others and consider head nods or verbal greetings only. . Avoid touching your eyes, nose, or mouth with unwashed hands.    . Avoid close contact with people who are sick. . Avoid places or events with large numbers of people in one location, like concerts or sporting events. . If you have some symptoms but not all symptoms, continue to monitor at home and seek medical attention if your symptoms worsen. . If you are having a medical emergency, call 911.   [place] / e-Visit: [email or web address]         [facility]: [facility]: [id number]                   [facility]: [id number]     It is flu season:   >>> Best ways to protect herself from the flu: Receive the yearly flu vaccine, practice good hand hygiene washing with soap and also using hand sanitizer when available, eat a nutritious meals, get adequate rest, hydrate appropriately   Please contact the office if your symptoms worsen or you have concerns that you are not improving.   Thank you for choosing [facility] for your healthcare, and for allowing [place] to partner with you on your healthcare journey. I am thankful to be able to provide care to you today.   Wyn Quaker FNP-C

## 2020-04-21 NOTE — Progress Notes (Signed)
Reviewed and agree with assessment/plan.   Chesley Mires, MD [facility] Pulmonary/Critical Care 04/21/2020, 5:08 PM Pager:  [phone]

## 2020-04-21 NOTE — Assessment & Plan Note (Signed)
May/2021 Home sleep study shows severe obstructive sleep apnea  Discussion: Discussed inspire device, oral appliance, CPAP.  We will proceed forward with CPAP therapy.  Patient spouse also uses CPAP therapy.  Patient would like to establish with a DME company.  Based off of cost may decide on purchasing CPAP however right with her [email or web address].  Plan: Start CPAP therapy APAP setting 5-15 Mask of choice Follow-up in 2 months

## 2020-04-23 ENCOUNTER — Other Ambulatory Visit: Payer: Self-pay

## 2020-04-23 ENCOUNTER — Encounter: Payer: Self-pay | Admitting: Nurse Practitioner

## 2020-04-23 ENCOUNTER — Ambulatory Visit (INDEPENDENT_AMBULATORY_CARE_PROVIDER_SITE_OTHER): Payer: BC Managed Care – PPO | Admitting: Nurse Practitioner

## 2020-04-23 VITALS — BP 130/80 | HR 103 | Ht 69.0 in | Wt 225.0 lb

## 2020-04-23 DIAGNOSIS — I10 Essential (primary) hypertension: Secondary | ICD-10-CM

## 2020-04-23 DIAGNOSIS — I25709 Atherosclerosis of coronary artery bypass graft(s), unspecified, with unspecified angina pectoris: Secondary | ICD-10-CM

## 2020-04-23 DIAGNOSIS — I493 Ventricular premature depolarization: Secondary | ICD-10-CM

## 2020-04-23 DIAGNOSIS — E785 Hyperlipidemia, unspecified: Secondary | ICD-10-CM

## 2020-04-23 MED ORDER — RANOLAZINE ER 1000 MG PO TB12: 1000.0000 mg | ORAL_TABLET | Freq: Two times a day (BID) | ORAL | 3 refills | Status: DC

## 2020-04-23 NOTE — Patient Instructions (Signed)
Medication Instructions:  1- INCREASE Ranexa  *If you need a refill on your cardiac medications before your next appointment, please call your pharmacy*   Lab Work: None ordered If you have labs (blood work) drawn today and your tests are completely normal, you will receive your results only by: Marland Kitchen MyChart Message (if you have MyChart) OR . A paper copy in the mail If you have any lab test that is abnormal or we need to change your treatment, we will call you to review the results.   Testing/Procedures: None ordered    Follow-Up: At [facility], you and your health needs are our priority.  As part of our continuing mission to provide you with exceptional heart care, we have created designated Provider Care Teams.  These Care Teams include your primary Cardiologist (physician) and Advanced Practice Providers (APPs -  Physician Assistants and Nurse Practitioners) who all work together to provide you with the care you need, when you need it.  We recommend signing up for the patient portal called "MyChart".  Sign up information is provided on this After Visit Summary.  MyChart is used to connect with patients for Virtual Visits (Telemedicine).  Patients are able to view lab/test results, encounter notes, upcoming appointments, etc.  Non-urgent messages can be sent to your provider as well.   To learn more about what you can do with MyChart, go to [email or web address].    Your next appointment:   6 month(s)  The format for your next appointment:   In Person  Provider:    You may see [name], MD or Murray Hodgkins, NP

## 2020-04-23 NOTE — Progress Notes (Signed)
Office Visit    Patient Name: Chase Scott Date of Encounter: 04/23/2020  Primary Care Provider:  Baxter Hire, MD Primary Cardiologist:  [name], MD  Chief Complaint    64 y/o ? with a history of CAD status post CABG and subsequent circumflex and RCA stenting, diastolic dysfunction, hypertension, hyperlipidemia, chronic chest pain, PVCs, and recent diagnosis of obstructive sleep apnea, who presents for follow-up related to chest pain.  Past Medical History    Past Medical History:  Diagnosis Date  . Alcoholism ([place])   . Anginal pain ([place])   . CAD (coronary artery disease)    a. 01/2003 s/p PCI to RCA;  b. 02/2003 s/ CABG x 4 (LIMA->LAD, RIMA->RCA, VG->Diag, VG->LCX; c. 2005 s/p PCI to LCX; d. 06/2012 Cath: LM nl, LAD 140m, LCX mild plaque, OM1 min irregs, OM2 patent stent, RCA 53m ISR, VG->LCX 100, VG->Diag ok, RIMA->RCA 100, LIMA->LAD ok, EF 50-55%-->Med Rx; d. 11/2017 Cath: stable anatomy, 2/4 patent grafts. LCX 30 ISR, RCA 50ost, 20 ISR-->Med Rx.  . Chronic Chest pain   . Diastolic dysfunction    a. 11/2017 Echo: EF 50-55%, no rwma, Gr1 DD, mildly dil LA; b. 02/2019 Echo: EF 50-55%, pseudonormalization. Mildly dil RA.  Marland Kitchen Dyslipidemia   . Gastroesophageal reflux disease   . Gastrointestinal symptoms    Chronic gastrointestinal symptoms  . History of kidney stones   . History of left shoulder fracture   . Hyperlipidemia   . Hypertensive cardiovascular disease    a. Labile BPs.  . OSA (obstructive sleep apnea)   . PVC's (premature ventricular contractions)    a. 2019 Holter: 18% PVC burden.  . Rotator cuff syndrome    Past Surgical History:  Procedure Laterality Date  . Arthroscopic Knee Surgery Right 2009  . Arthroscopic Knee Surgery Left 10/2017  . BACK SURGERY     Lower  . CARDIAC CATHETERIZATION    . COLONOSCOPY WITH PROPOFOL N/A 04/29/2018   Procedure: COLONOSCOPY WITH PROPOFOL;  Surgeon: Manya Silvas, MD;  Location: [facility] ENDOSCOPY;  Service:  Endoscopy;  Laterality: N/A;  . CORONARY ARTERY BYPASS GRAFT  April 2004   x four  . ESOPHAGOGASTRODUODENOSCOPY (EGD) WITH PROPOFOL N/A 04/29/2018   Procedure: ESOPHAGOGASTRODUODENOSCOPY (EGD) WITH PROPOFOL;  Surgeon: Manya Silvas, MD;  Location: [facility] ENDOSCOPY;  Service: Endoscopy;  Laterality: N/A;  . LEFT HEART CATH AND CORONARY ANGIOGRAPHY N/A 12/14/2017   Procedure: LEFT HEART CATH AND CORONARY ANGIOGRAPHY;  Surgeon: Wellington Hampshire, MD;  Location: [facility] LAB;  Service: Cardiovascular;  Laterality: N/A;  . LEFT HEART CATHETERIZATION WITH CORONARY/GRAFT ANGIOGRAM  07/02/2012   Procedure: LEFT HEART CATHETERIZATION WITH Beatrix Fetters;  Surgeon: Burnell Blanks, MD;  Location: [facility] CATH LAB;  Service: Cardiovascular;;  . TONSILLECTOMY      Allergies  Allergies  Allergen Reactions  . Penicillins Anaphylaxis  . Sulfa Antibiotics Anaphylaxis  . Etodolac   . Lipitor [Atorvastatin Calcium]     Myalgias @ 80 mg  . Paxil [Paroxetine Hcl]   . Statins     Myalgias on high dose lipitor and moderate dose crestor.  . Sulfasalazine     History of Present Illness    64 year old male with above complex past medical history including coronary artery disease status post CABG x4 in 2004 subsequent PCI of the left circumflex and right coronary artery.  Other history includes chronic diastolic congestive heart failure, hypertension, and hyperlipidemia.  He notes chronic chest pain as well, dating back to  his bypass surgery and he was evaluated with relook catheterizations in August 2013 and again in January 2019, revealing 2 of 4 patent grafts with patent circumflex and RCA stents.  He was last seen in clinic in December 2020 and notes that he continues to experience intermittent rest and exertional chest discomfort.  This has been a stable pattern dating back several years, and certainly at least as far back as his last catheterization.  Symptoms occur to some extent every  day and can last a few minutes or entire days or weeks at a time.  He does have exertional symptoms associated with dyspnea that do improve with rest.  He isn't sure if the addition of long-acting nitrate or Ranexa has helped.  In although, symptoms have been stable.  He denies palpitations, PND, orthopnea, dizziness, syncope, edema, or early satiety.  Home Medications    Prior to Admission medications   Medication Sig Start Date End Date Taking? Authorizing Provider  Ascorbic Acid (VITAMIN C) 1000 MG tablet Take 1,000 mg by mouth daily.   Yes [provider]  aspirin EC 81 MG EC tablet Take 1 tablet (81 mg total) by mouth daily. 12/15/17  Yes Salary, Avel Peace, MD  clopidogrel (PLAVIX) 75 MG tablet Take 1 tablet (75 mg total) by mouth daily. 08/23/18  Yes Wellington Hampshire, MD  esomeprazole (NEXIUM) 20 MG capsule Take 40 mg by mouth at bedtime.    Yes [provider]  isosorbide mononitrate (IMDUR) 30 MG 24 hr tablet TAKE 1 TABLET DAILY 04/05/20  Yes Wellington Hampshire, MD  Magnesium 250 MG TABS Take by mouth daily.   Yes [provider]  metoprolol succinate (TOPROL-XL) 100 MG 24 hr tablet Take 150 mg by mouth daily. Take with or immediately following a meal.   Yes [provider]  nitroGLYCERIN (NITROSTAT) 0.4 MG SL tablet Place 1 tablet (0.4 mg total) under the tongue every 5 (five) minutes as needed for chest pain. 01/05/19  Yes Mayo, Pete Pelt, MD  ranolazine (RANEXA) 500 MG 12 hr tablet TAKE 1 TABLET TWICE A DAY 05/13/19  Yes Arida, Mertie Clause, MD  REPATHA SURECLICK 742 MG/ML SOAJ INJECT 1 SYPRINEFUL INTO SKIN EVERY 14 DAYS              (NEEDS OFFICE VISIT FOR FURTHER REFILLS) 03/19/20  Yes Wellington Hampshire, MD  sucralfate (CARAFATE) 1 g tablet Take 1 g by mouth as needed.    Yes [provider]    Review of Systems    Chronic rest and exertional chest pain which has been stable over several years.  He also has chronic, stable dyspnea on exertion.   He denies palpitations, PND, orthopnea, dizziness, syncope, edema, or early satiety.  All other systems reviewed and are otherwise negative except as noted above.  Physical Exam    VS:  BP 130/80 (BP Location: Left Arm, Patient Position: Sitting, Cuff Size: Normal)   Pulse (!) 103   Ht 5\' 9"  (1.753 m)   Wt 225 lb (102.1 kg)   SpO2 98%   BMI 33.23 kg/m  , BMI Body mass index is 33.23 kg/m. GEN: Well nourished, well developed, in no acute distress. HEENT: normal. Neck: Supple, no JVD, carotid bruits, or masses. Cardiac: RRR, no murmurs, rubs, or gallops. No clubbing, cyanosis, edema.  Radials/PT 2+ and equal bilaterally.  Respiratory:  Respirations regular and unlabored, clear to auscultation bilaterally. GI: Soft, nontender, nondistended, BS + x 4. MS: no deformity or  atrophy. Skin: warm and dry, no rash. Neuro:  Strength and sensation are intact. Psych: Normal affect.  Accessory Clinical Findings    ECG personally reviewed by me today -sinus tachycardia, 103,, ventricular trigeminy.  Left atrial enlargement, nonspecific ST/T changes- no acute changes.  Lab Results  Component Value Date   WBC 7.1 01/05/2019   HGB 15.2 01/05/2019   HCT 44.4 01/05/2019   MCV 90.4 01/05/2019   PLT 213 01/05/2019   Lab Results  Component Value Date   CREATININE 1.07 01/05/2019   BUN 15 01/05/2019   NA 137 01/05/2019   K 3.9 01/05/2019   CL 103 01/05/2019   CO2 29 01/05/2019   Lab Results  Component Value Date   ALT 19 01/03/2019   AST 22 01/03/2019   ALKPHOS 47 01/03/2019   BILITOT 0.8 01/03/2019   Lab Results  Component Value Date   CHOL 80 10/10/2018   HDL 58 10/10/2018   LDLCALC 13 10/10/2018   TRIG 43 10/10/2018   CHOLHDL 1.4 10/10/2018    Lab Results  Component Value Date   HGBA1C 5.5 01/05/2019    Assessment & Plan    1.  Coronary artery disease/chronic chest pain: Patient with prior history of CABG x4 in April 2004 with subsequent finding of occlusions of the vein  graft to the circumflex and RIMA to the RCA with PCI and stenting of the native circumflex and RCA.  He has chronic chest pain dating back several years with his most recent catheterization in January 2019 showing stable anatomy with 2 of 4 patent grafts and patent circumflex and RCA stents.  He has not really had any change in his symptoms since then and continues to experience some amount of daily chest pain that can last days to weeks at a time.  He also has chronic, stable dyspnea on exertion.  He is clear today that he has had no progression of symptoms over several years.  As such, we will continue medical therapy including aspirin, Plavix, beta-blocker, nitrate, Ranexa, and PCSK9 inhibitor therapy.  I will increase his ranolazine to 1000 mg twice daily.  2.  Essential hypertension: Stable.  3.  Hyperlipidemia: Intolerant to statins and treated with PCSK9 inhibitor therapy.  LDL was 13 in November 2019.  More recent lipids have been followed by primary care.  4.  PVCs: 28,000 PVCs noted on prior Holter monitoring in 2019 accounting for a burden of 18%.  He is asymptomatic.  He remains on beta-blocker and Ranexa therapy.  5.  Disposition: Follow-up in clinic in 3 months or sooner if necessary.  Murray Hodgkins, NP 04/23/2020, 4:26 PM

## 2020-06-11 ENCOUNTER — Other Ambulatory Visit: Payer: Self-pay | Admitting: Cardiovascular Disease

## 2020-07-05 ENCOUNTER — Other Ambulatory Visit: Payer: Self-pay | Admitting: Cardiovascular Disease

## 2020-07-10 ENCOUNTER — Other Ambulatory Visit: Payer: Self-pay | Admitting: Cardiovascular Disease

## 2020-07-29 IMAGING — CR DG CHEST 2V
2 series · 2 of 2 positions shown · non-contrast
Comparison: None.

CLINICAL DATA: Chest pain for several months.

EXAM:
CHEST - 2 VIEW

[chest pa]
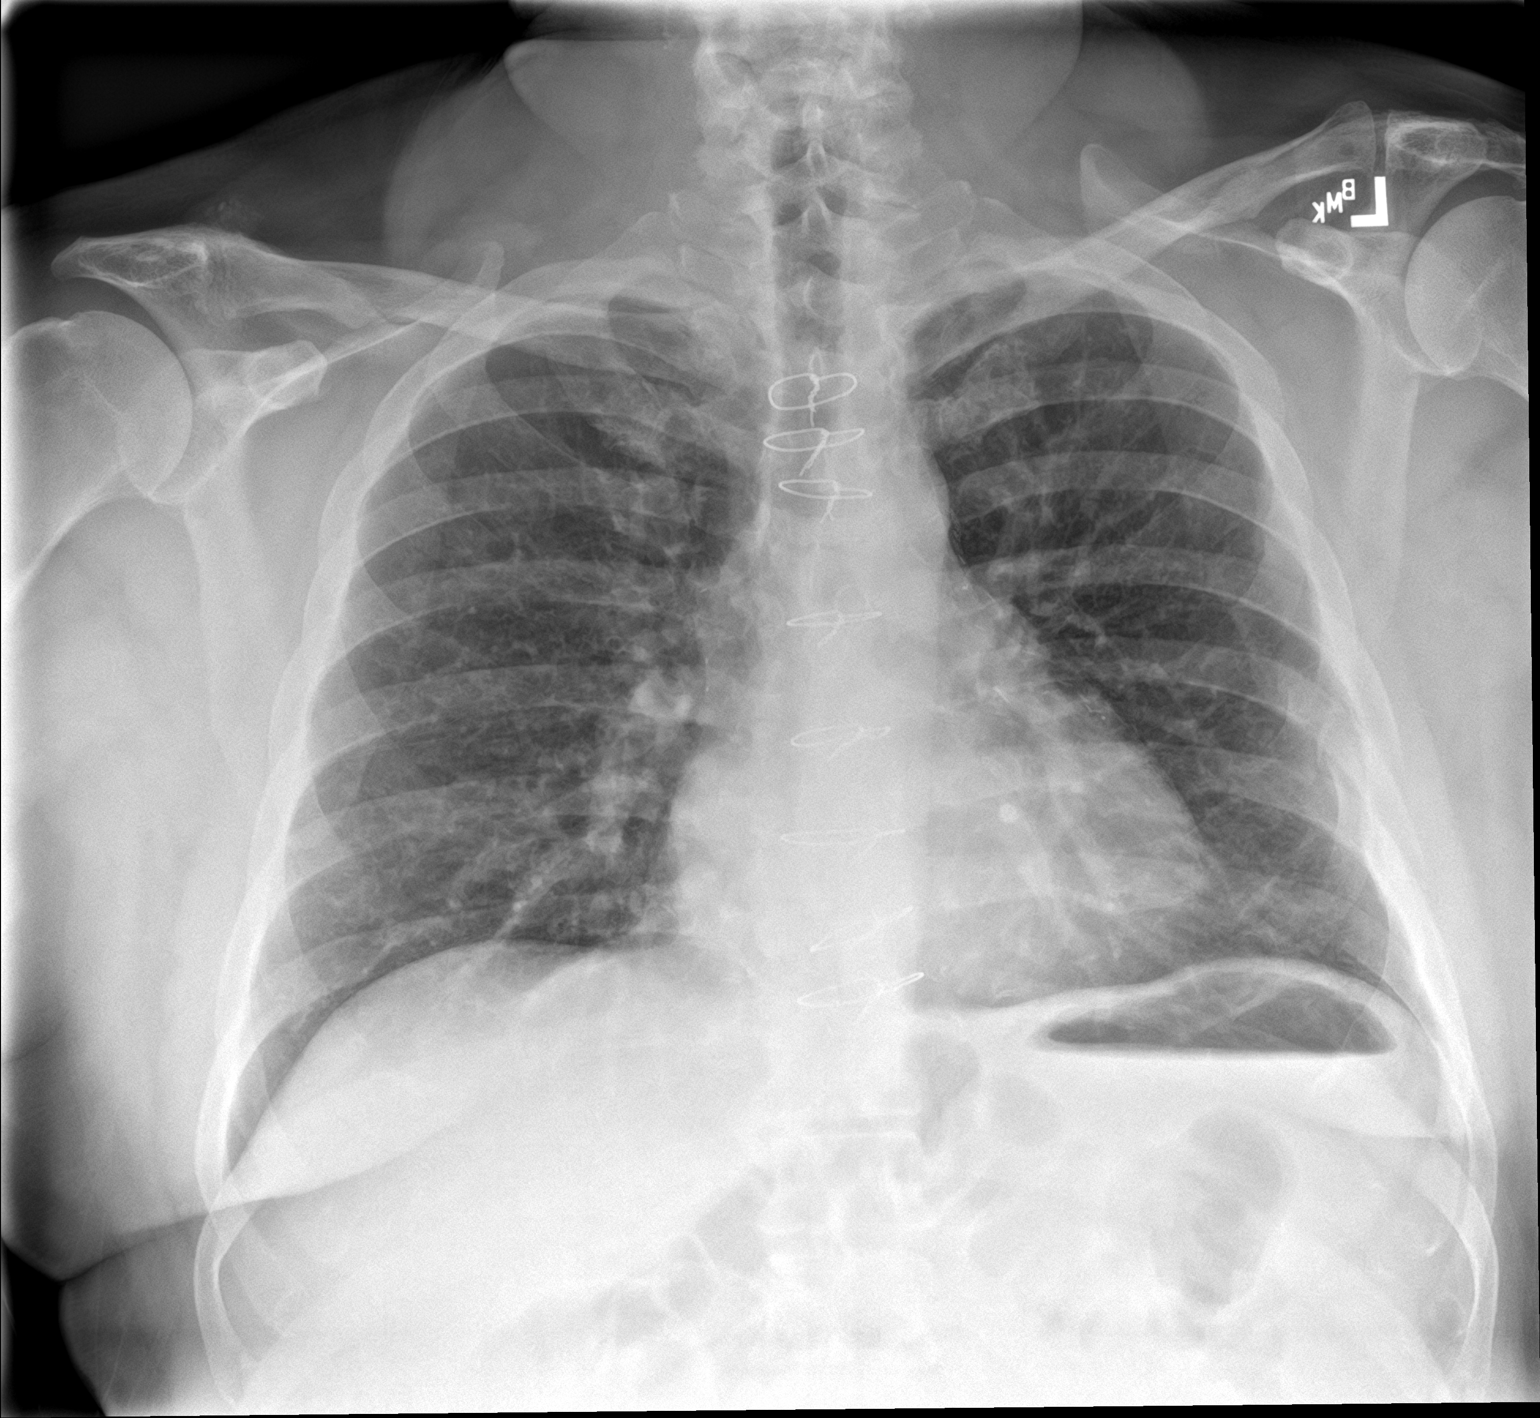

[chest lat]
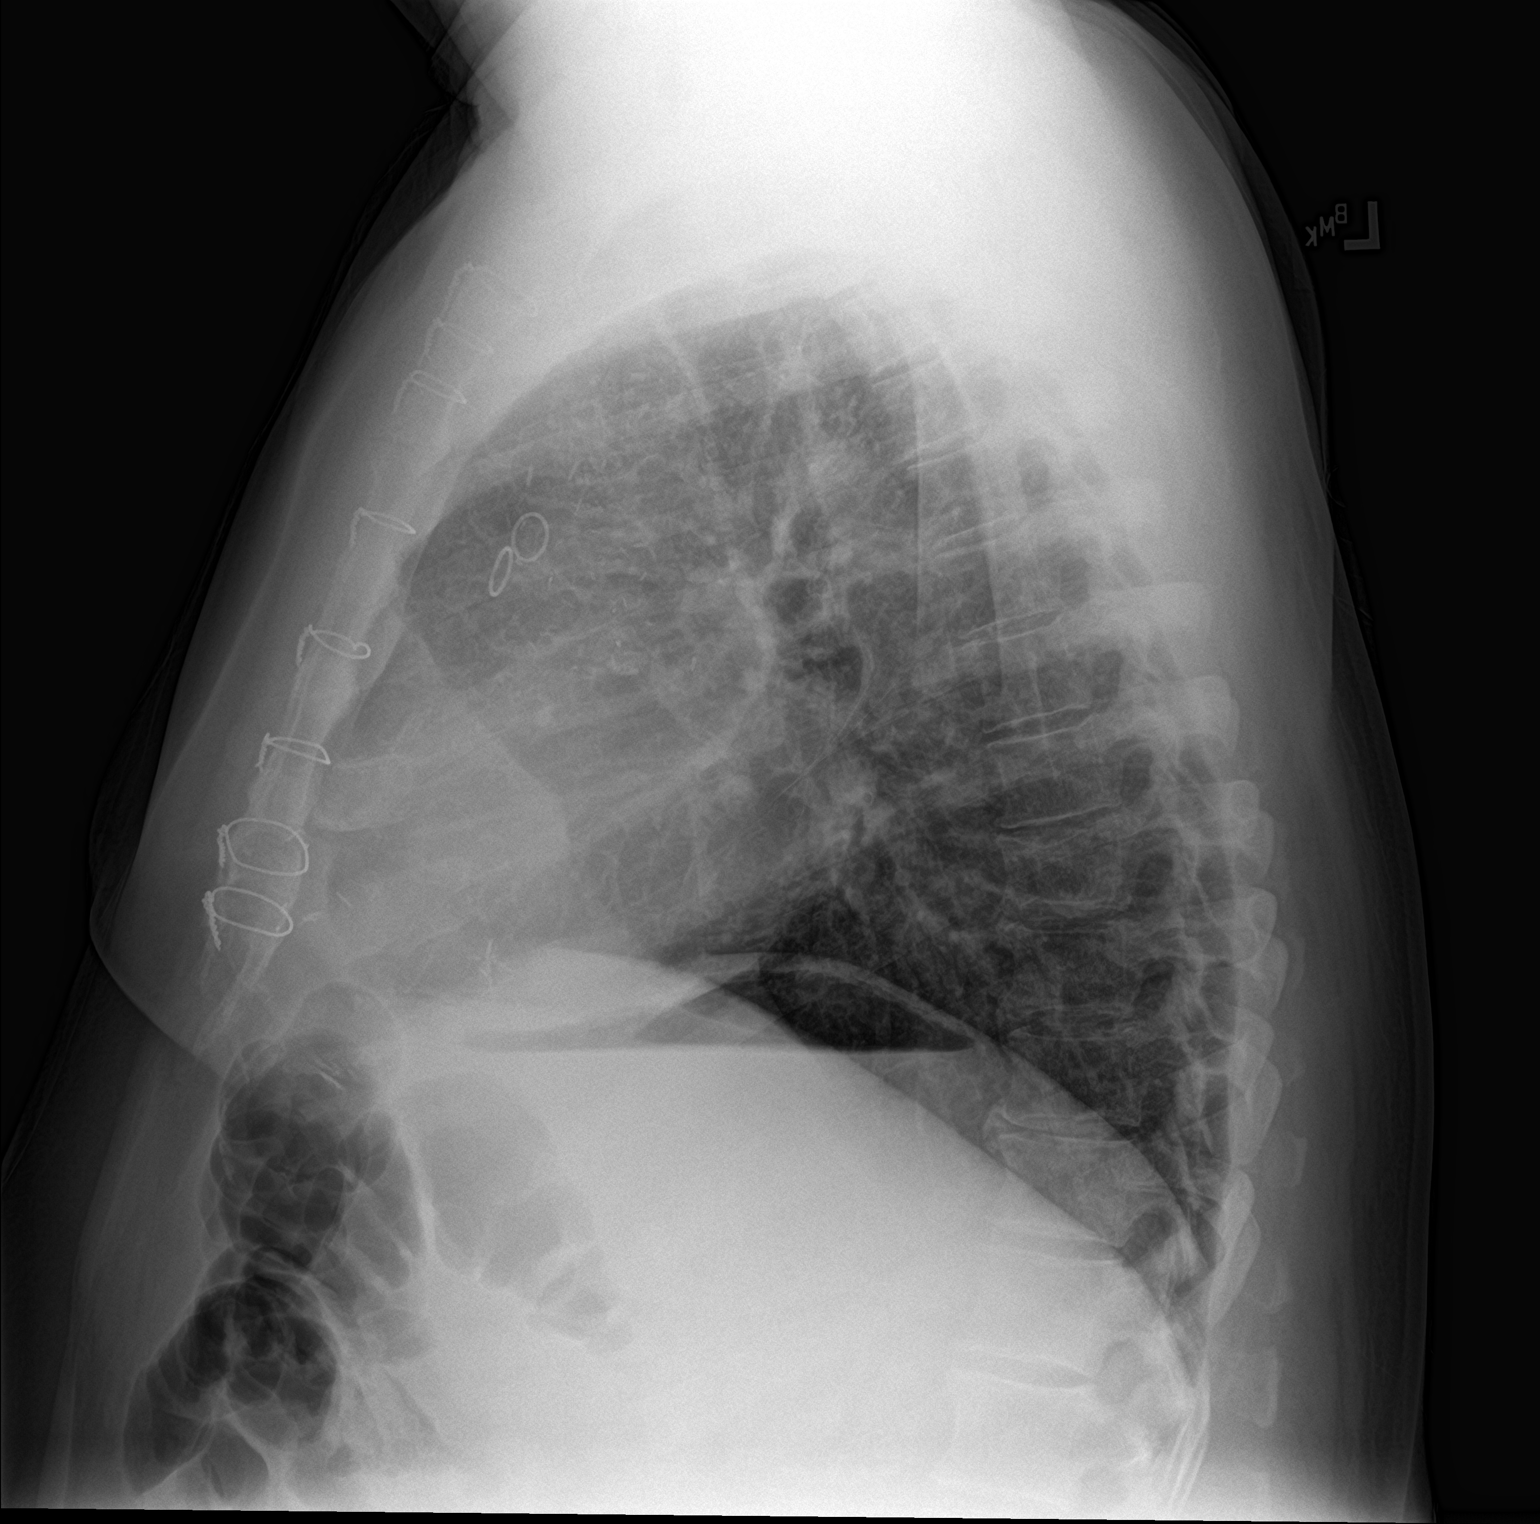

[2 of 2 positions shown; findings below may reference images not displayed]

FINDINGS: The heart size and mediastinal contours are stable. Both lungs are
clear. The visualized skeletal structures are unremarkable.
IMPRESSION: No active cardiopulmonary disease.

## 2020-07-31 IMAGING — US ULTRASOUND ABDOMEN LIMITED
1 series · 14 of 25 positions shown · non-contrast
Comparison: None

CLINICAL DATA: Epigastric pain.

EXAM:
ULTRASOUND ABDOMEN LIMITED RIGHT UPPER QUADRANT

[Series 1: ultrasound abdomen limited · 0.27mm/px · 14 of 35 slices shown]
[im 1/35]
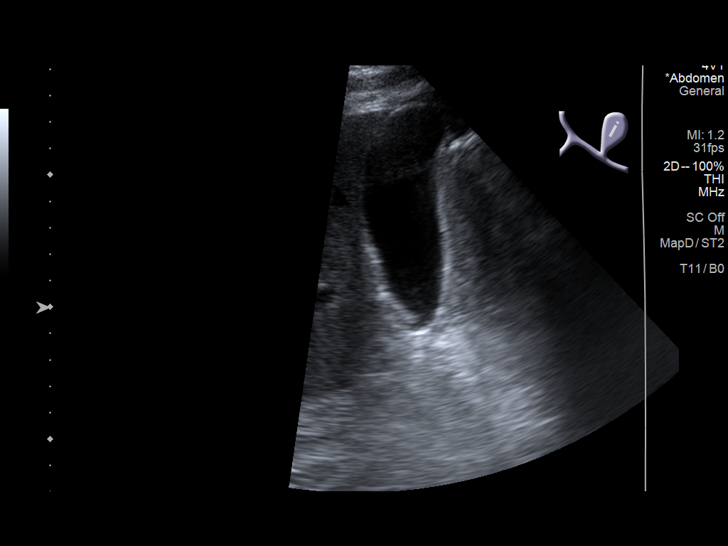
[im 3/35]
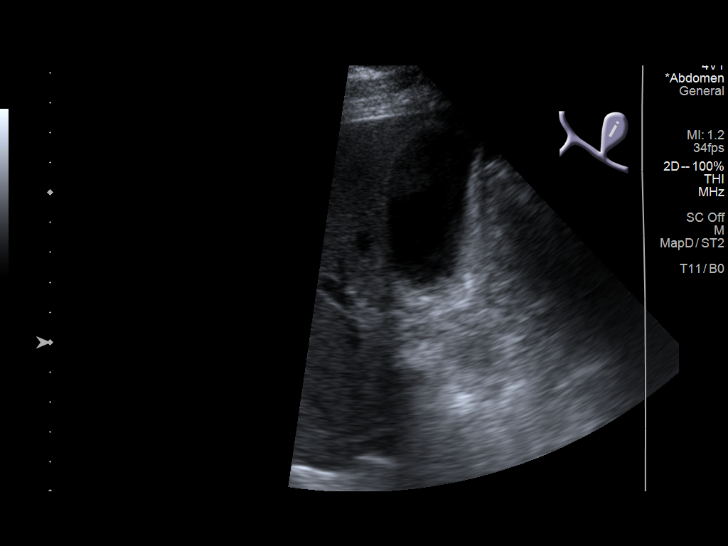
[im 6/35]
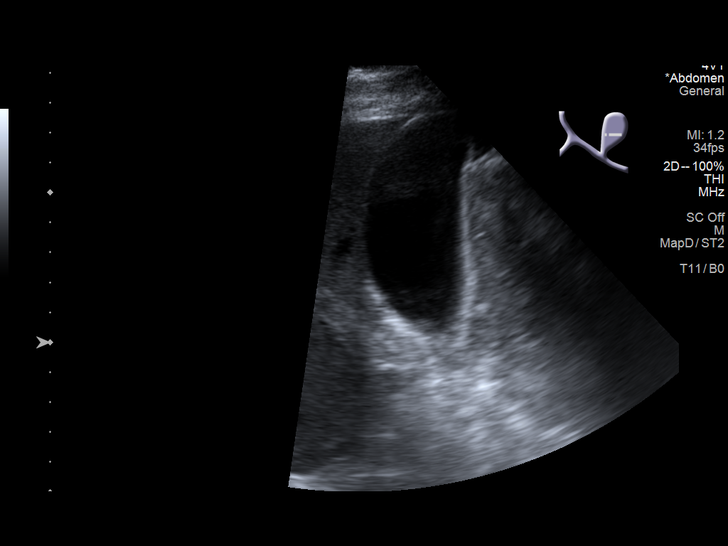
[im 9/35]
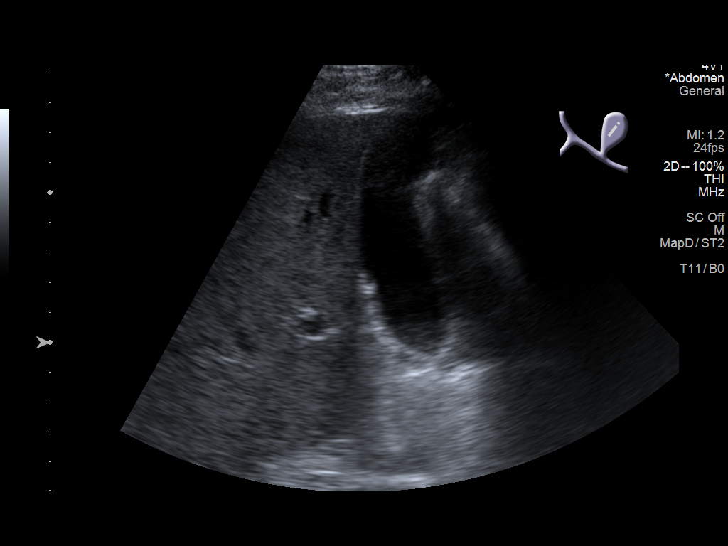
[im 12/35]
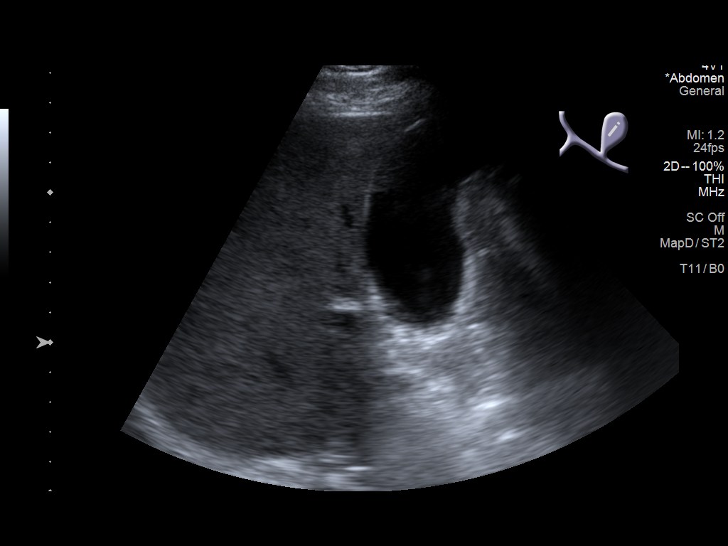
[im 13/35]
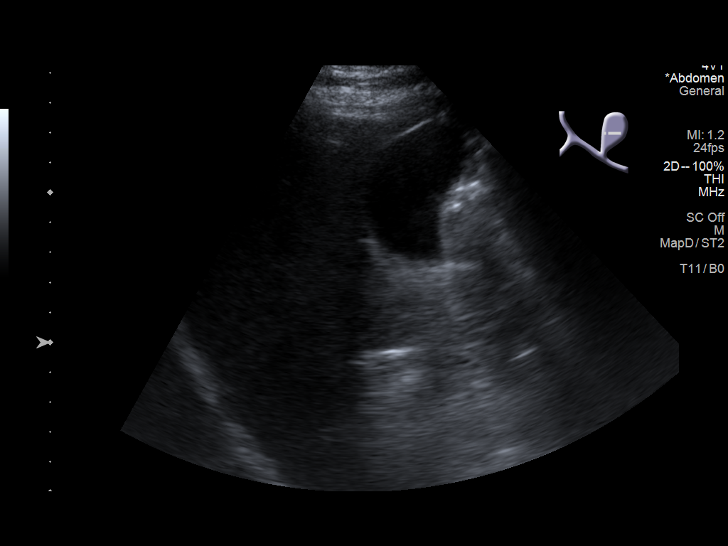
[im 16/35]
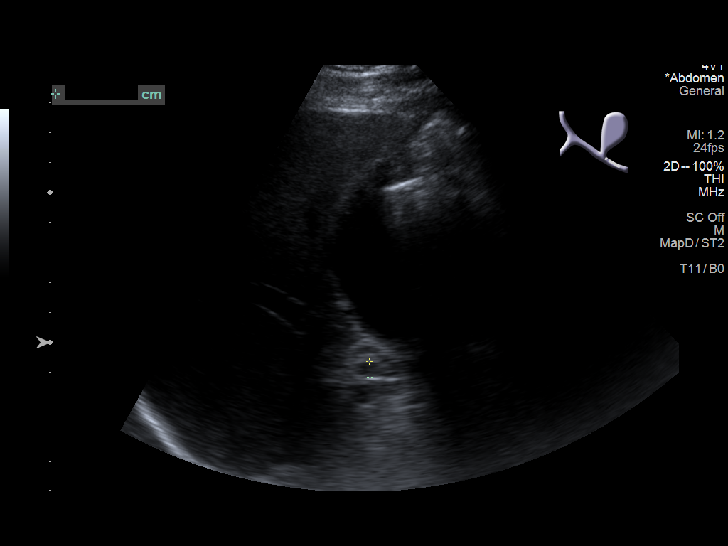
[im 19/35]
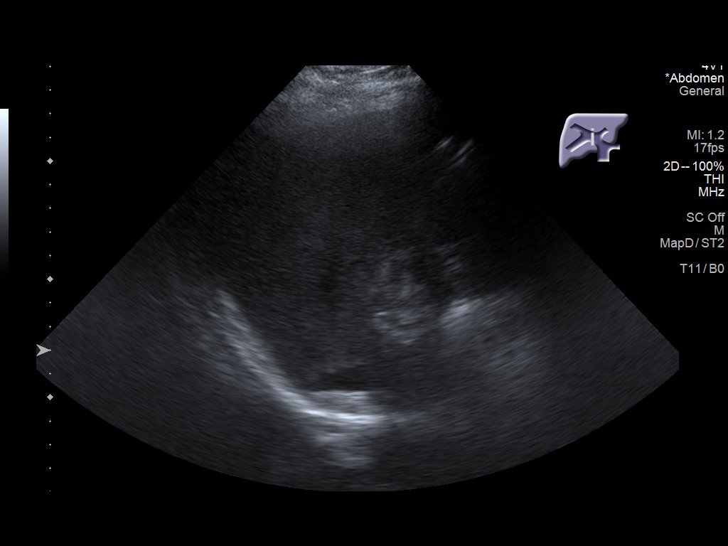
[im 22/35]
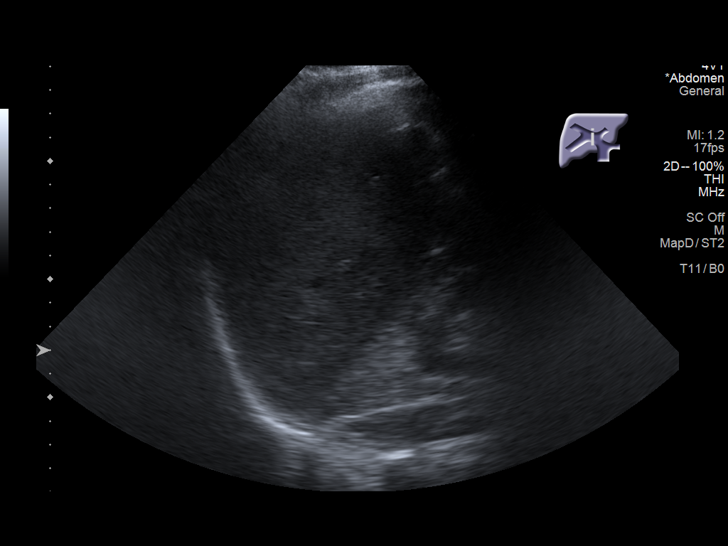
[im 23/35]
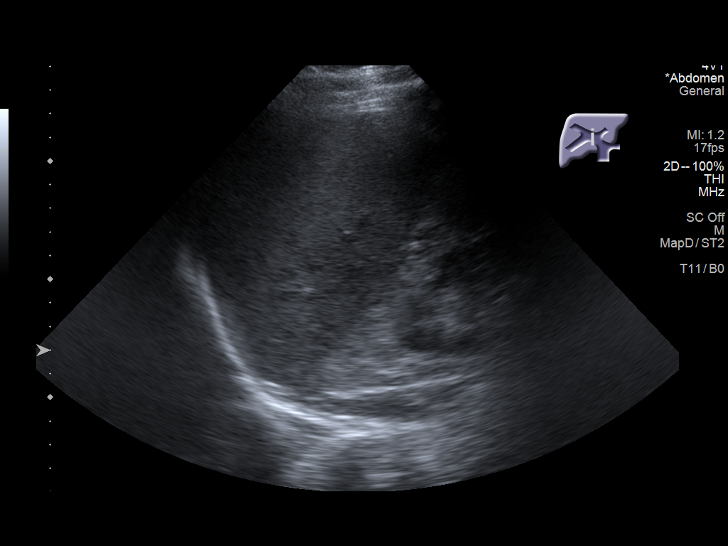
[im 26/35]
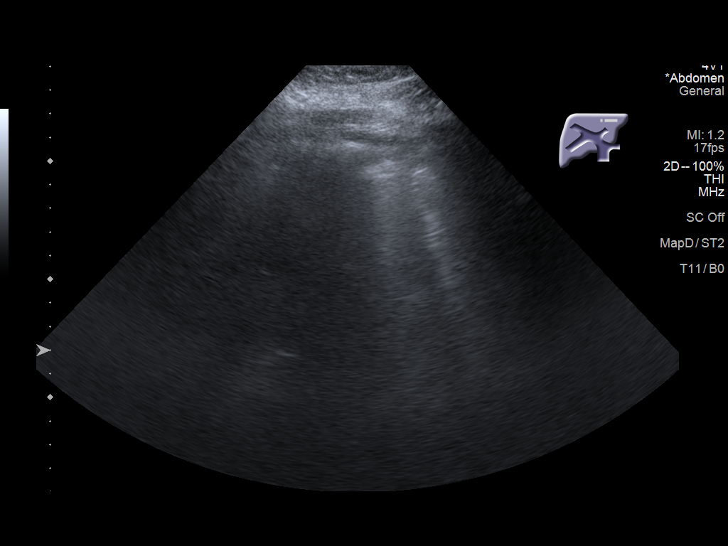
[im 29/35]
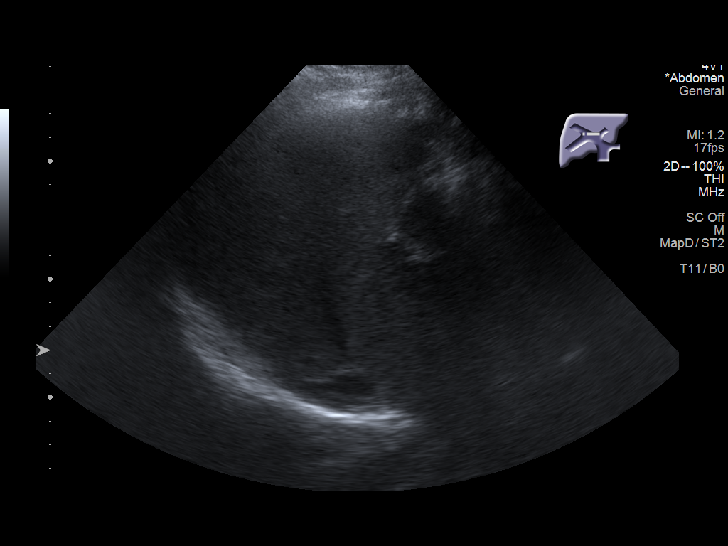
[im 32/35]
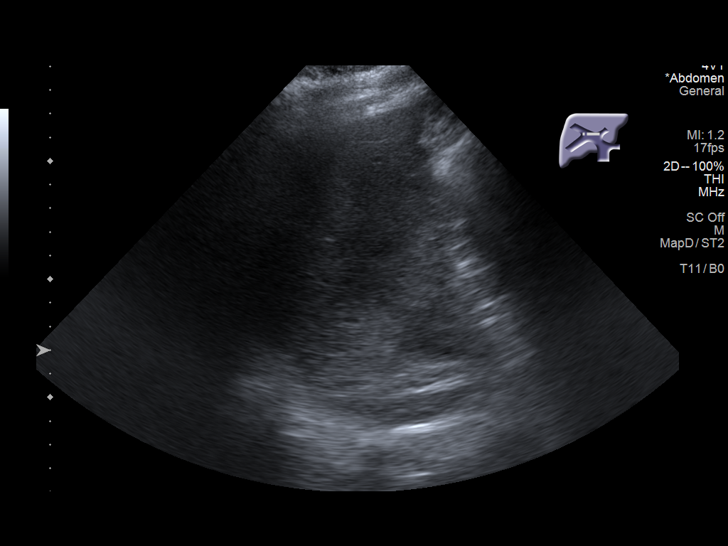
[im 35/35]
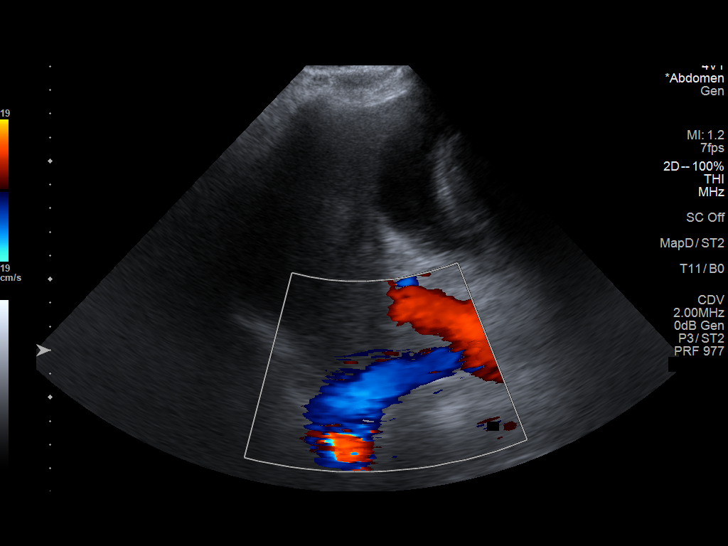

[14 of 25 positions shown; findings below may reference images not displayed]

FINDINGS: Gallbladder:

No gallstones or wall thickening visualized. No sonographic Murphy
sign noted by sonographer.

Common bile duct:

Diameter: 5.2 mm

Liver:

Probable hepatic steatosis. No focal mass. Portal vein is patent on
color Doppler imaging with normal direction of blood flow towards
the liver.
IMPRESSION: Probable hepatic steatosis with increased echogenicity in the liver.
No other abnormalities.

## 2020-09-13 ENCOUNTER — Other Ambulatory Visit: Payer: Self-pay | Admitting: Cardiovascular Disease

## 2020-09-13 MED ORDER — RANOLAZINE ER 1000 MG PO TB12: 1000.0000 mg | ORAL_TABLET | Freq: Two times a day (BID) | ORAL | 0 refills | Status: DC

## 2020-09-13 NOTE — Telephone Encounter (Signed)
Requested Prescriptions   Signed Prescriptions Disp Refills   ranolazine (RANEXA) 1000 MG SR tablet 180 tablet 0    Sig: Take 1 tablet (1,000 mg total) by mouth 2 (two) times daily.    Authorizing Provider: Theora Gianotti    Ordering User: Britt Bottom

## 2020-09-13 NOTE — Telephone Encounter (Signed)
*  STAT* If patient is at the pharmacy, call can be transferred to refill team.   1. Which medications need to be refilled? (please list name of each medication and dose if known) ranolazine 1000 mg   2. Which pharmacy/location (including street and city if local pharmacy) is medication to be sent to[facility]  3. Do they need a 30 day or 90 day supply? 90 with refills

## 2020-09-16 ENCOUNTER — Telehealth: Payer: Self-pay | Admitting: Pulmonary Disease

## 2020-09-16 NOTE — Telephone Encounter (Signed)
Called and spoke to pt. Informed him to call his DME and see if they got the same letter as they may need to send information to the insurance company to prove the pt qualifies for the CPAP. Pt verbalized understanding and is aware to contact us back if there are any issues. Will sign off.

## 2020-10-28 ENCOUNTER — Ambulatory Visit: Payer: BC Managed Care – PPO | Admitting: Cardiovascular Disease

## 2020-11-26 ENCOUNTER — Telehealth: Payer: Self-pay

## 2020-11-26 ENCOUNTER — Other Ambulatory Visit: Payer: Self-pay

## 2020-11-26 ENCOUNTER — Telehealth (INDEPENDENT_AMBULATORY_CARE_PROVIDER_SITE_OTHER): Payer: BC Managed Care – PPO | Admitting: Cardiovascular Disease

## 2020-11-26 ENCOUNTER — Encounter: Payer: Self-pay | Admitting: Cardiovascular Disease

## 2020-11-26 VITALS — BP 131/79 | HR 75 | Ht 70.0 in | Wt 227.0 lb

## 2020-11-26 DIAGNOSIS — I2510 Atherosclerotic heart disease of native coronary artery without angina pectoris: Secondary | ICD-10-CM | POA: Diagnosis not present

## 2020-11-26 DIAGNOSIS — E785 Hyperlipidemia, unspecified: Secondary | ICD-10-CM

## 2020-11-26 DIAGNOSIS — I10 Essential (primary) hypertension: Secondary | ICD-10-CM

## 2020-11-26 DIAGNOSIS — I493 Ventricular premature depolarization: Secondary | ICD-10-CM | POA: Diagnosis not present

## 2020-11-26 NOTE — Patient Instructions (Signed)

## 2020-11-26 NOTE — Telephone Encounter (Signed)
  Patient Consent for Virtual Visit         Chase Scott has provided verbal consent on 11/26/2020 for a virtual visit (video or telephone).   CONSENT FOR VIRTUAL VISIT FOR:  Chase Scott  By participating in this virtual visit I agree to the following:  I hereby voluntarily request, consent and authorize [facility] and its employed or contracted physicians, physician assistants, nurse practitioners or other licensed health care professionals (the Practitioner), to provide me with telemedicine health care services (the "Services") as deemed necessary by the treating Practitioner. I acknowledge and consent to receive the Services by the Practitioner via telemedicine. I understand that the telemedicine visit will involve communicating with the Practitioner through live audiovisual communication technology and the disclosure of certain medical information by electronic transmission. I acknowledge that I have been given the opportunity to request an in-person assessment or other available alternative prior to the telemedicine visit and am voluntarily participating in the telemedicine visit.  I understand that I have the right to withhold or withdraw my consent to the use of telemedicine in the course of my care at any time, without affecting my right to future care or treatment, and that the Practitioner or I may terminate the telemedicine visit at any time. I understand that I have the right to inspect all information obtained and/or recorded in the course of the telemedicine visit and may receive copies of available information for a reasonable fee.  I understand that some of the potential risks of receiving the Services via telemedicine include:  Marland Kitchen Delay or interruption in medical evaluation due to technological equipment failure or disruption; . Information transmitted may not be sufficient (e.g. poor resolution of images) to allow for appropriate medical decision making by the Practitioner;  and/or  . In rare instances, security protocols could fail, causing a breach of personal health information.  Furthermore, I acknowledge that it is my responsibility to provide information about my medical history, conditions and care that is complete and accurate to the best of my ability. I acknowledge that Practitioner's advice, recommendations, and/or decision may be based on factors not within their control, such as incomplete or inaccurate data provided by me or distortions of diagnostic images or specimens that may result from electronic transmissions. I understand that the practice of medicine is not an exact science and that Practitioner makes no warranties or guarantees regarding treatment outcomes. I acknowledge that a copy of this consent can be made available to me via my patient portal ([facility]), or I can request a printed copy by calling the office of [facility].    I understand that my insurance will be billed for this visit.   I have read or had this consent read to me. . I understand the contents of this consent, which adequately explains the benefits and risks of the Services being provided via telemedicine.  . I have been provided ample opportunity to ask questions regarding this consent and the Services and have had my questions answered to my satisfaction. . I give my informed consent for the services to be provided through the use of telemedicine in my medical care

## 2020-11-26 NOTE — Progress Notes (Signed)
Virtual Visit via Video Note   This visit type was conducted due to national recommendations for restrictions regarding the COVID-19 Pandemic (e.g. social distancing) in an effort to limit this patient's exposure and mitigate transmission in our community.  Due to his co-morbid illnesses, this patient is at least at moderate risk for complications without adequate follow up.  This format is felt to be most appropriate for this patient at this time.  All issues noted in this document were discussed and addressed.  A limited physical exam was performed with this format.  Please refer to the patient's chart for his consent to telehealth for [facility].   Evaluation Performed:  Follow-up visit  Date:  11/26/2020   ID:  Arinze, Rivadeneira [birth year], MRN [id number]  Patient Location: Home  Provider Location: Home Office  PCP:  Baxter Hire, MD  Cardiologist:  [name], MD  Electrophysiologist:  None   Chief Complaint:  fu  History of Present Illness:    REQUAN Scott is a 65 y.o. male who was seen via video visit for a follow-up regarding coronary artery disease.  He has known history of coronary artery disease status post CABG in 2004 with subsequent PCI of left circumflex and right coronary artery.  He also has chronic diastolic heart failure, hypertension and hyperlipidemia. He was hospitalized in January, 2019 with unstable angina. Echocardiogram showed an EF of 50% with grade 1 diastolic dysfunction. Cardiac catheterization showed significant underlying three-vessel coronary artery disease with patent stents in the left circumflex and right coronary arteries.  There was patent LIMA to LAD and SVG to diagonal.  SVG to left circumflex was chronically occluded with atretic RIMA to RCA.  There was moderate ostial RCA stenosis with catheter-induced spasm that improved with nitroglycerin.  Left ventricular end-diastolic pressure was normal. He was treated medically.    He is known  to have PVCs.  Holter monitor in April, 2019 showed 28,000 PVC in 48 hours representing 18% burden.  Average heart rate was 71 bpm.     He has known history of hyperlipidemia with intolerance to statins.  He is tolerating Repatha with significant improvement in lipid profile.   He has prolonged history of chest pain that has recently improved after he started using CPAP regularly.  He feels significantly better.  No shortness of breath.  He was diagnosed with Covid infection in November with moderate symptoms.  He improved quickly after he received monoclonal antibodies.  He feels back to baseline.  The patient does not have symptoms concerning for COVID-19 infection (fever, chills, cough, or new shortness of breath).    Past Medical History:  Diagnosis Date  . Alcoholism ([place])   . Anginal pain ([place])   . CAD (coronary artery disease)    a. 01/2003 s/p PCI to RCA;  b. 02/2003 s/ CABG x 4 (LIMA->LAD, RIMA->RCA, VG->Diag, VG->LCX; c. 2005 s/p PCI to LCX; d. 06/2012 Cath: LM nl, LAD 129m, LCX mild plaque, OM1 min irregs, OM2 patent stent, RCA 28m ISR, VG->LCX 100, VG->Diag ok, RIMA->RCA 100, LIMA->LAD ok, EF 50-55%-->Med Rx; d. 11/2017 Cath: stable anatomy, 2/4 patent grafts. LCX 30 ISR, RCA 50ost, 20 ISR-->Med Rx.  . Chronic Chest pain   . Diastolic dysfunction    a. 11/2017 Echo: EF 50-55%, no rwma, Gr1 DD, mildly dil LA; b. 02/2019 Echo: EF 50-55%, pseudonormalization. Mildly dil RA.  Marland Kitchen Dyslipidemia   . Gastroesophageal reflux disease   . Gastrointestinal symptoms    Chronic  gastrointestinal symptoms  . History of kidney stones   . History of left shoulder fracture   . Hyperlipidemia   . Hypertensive cardiovascular disease    a. Labile BPs.  . OSA (obstructive sleep apnea)   . PVC's (premature ventricular contractions)    a. 2019 Holter: 18% PVC burden.  . Rotator cuff syndrome    Past Surgical History:  Procedure Laterality Date  . Arthroscopic Knee Surgery Right 2009  . Arthroscopic  Knee Surgery Left 10/2017  . BACK SURGERY     Lower  . CARDIAC CATHETERIZATION    . COLONOSCOPY WITH PROPOFOL N/A 04/29/2018   Procedure: COLONOSCOPY WITH PROPOFOL;  Surgeon: Manya Silvas, MD;  Location: [facility] ENDOSCOPY;  Service: Endoscopy;  Laterality: N/A;  . CORONARY ARTERY BYPASS GRAFT  April 2004   x four  . ESOPHAGOGASTRODUODENOSCOPY (EGD) WITH PROPOFOL N/A 04/29/2018   Procedure: ESOPHAGOGASTRODUODENOSCOPY (EGD) WITH PROPOFOL;  Surgeon: Manya Silvas, MD;  Location: [facility] ENDOSCOPY;  Service: Endoscopy;  Laterality: N/A;  . LEFT HEART CATH AND CORONARY ANGIOGRAPHY N/A 12/14/2017   Procedure: LEFT HEART CATH AND CORONARY ANGIOGRAPHY;  Surgeon: Wellington Hampshire, MD;  Location: [facility] CV LAB;  Service: Cardiovascular;  Laterality: N/A;  . LEFT HEART CATHETERIZATION WITH CORONARY/GRAFT ANGIOGRAM  07/02/2012   Procedure: LEFT HEART CATHETERIZATION WITH Beatrix Fetters;  Surgeon: Burnell Blanks, MD;  Location: [facility] CATH LAB;  Service: Cardiovascular;;  . TONSILLECTOMY       Current Meds  Medication Sig  . Ascorbic Acid (VITAMIN C) 1000 MG tablet Take 1,000 mg by mouth daily.  Marland Kitchen aspirin EC 81 MG EC tablet Take 1 tablet (81 mg total) by mouth daily.  . clopidogrel (PLAVIX) 75 MG tablet Take 1 tablet (75 mg total) by mouth daily.  Marland Kitchen esomeprazole (NEXIUM) 20 MG capsule Take 40 mg by mouth at bedtime.   . isosorbide mononitrate (IMDUR) 30 MG 24 hr tablet TAKE 1 TABLET DAILY  . metoprolol succinate (TOPROL-XL) 100 MG 24 hr tablet Take 150 mg by mouth daily. Take with or immediately following a meal.  . nitroGLYCERIN (NITROSTAT) 0.4 MG SL tablet Place 1 tablet (0.4 mg total) under the tongue every 5 (five) minutes as needed for chest pain.  . ranolazine (RANEXA) 1000 MG SR tablet Take 1 tablet (1,000 mg total) by mouth 2 (two) times daily. (Patient taking differently: Take 500 mg by mouth 2 (two) times daily.)  . REPATHA SURECLICK [id number] MG/ML SOAJ INJECT 2MLS INTO THE  SKIN EVERY 14 DAYS  . sucralfate (CARAFATE) 1 g tablet Take 1 g by mouth as needed.      Allergies:   Penicillins, Sulfa antibiotics, Etodolac, Lipitor [atorvastatin calcium], Paxil [paroxetine hcl], Statins, and Sulfasalazine   Social History   Tobacco Use  . Smoking status: Former Smoker    Packs/day: 1.00    Years: 15.00    Pack years: 15.00    Types: Cigarettes    Quit date: 11/21/1999    Years since quitting: 21.0  . Smokeless tobacco: Never Used  Vaping Use  . Vaping Use: Never used  Substance Use Topics  . Alcohol use: No  . Drug use: No     Family Hx: The patient's family history includes Heart attack in his sister; Microcephaly in his sister; Other in his father and mother.  ROS:   Please see the history of present illness.      All other systems reviewed and are negative.   Prior CV studies:   The following  studies were reviewed today:  none  Labs/Other Tests and Data Reviewed:    EKG:  No ECG reviewed.  Recent Labs: No results found for requested labs within last 8760 hours.   Recent Lipid Panel Lab Results  Component Value Date/Time   CHOL 80 10/10/2018 09:20 AM   CHOL 180 03/15/2018 02:24 PM   TRIG 43 10/10/2018 09:20 AM   HDL 58 10/10/2018 09:20 AM   HDL 53 03/15/2018 02:24 PM   CHOLHDL 1.4 10/10/2018 09:20 AM   LDLCALC 13 10/10/2018 09:20 AM   LDLCALC 107 (H) 03/15/2018 02:24 PM    Wt Readings from Last 3 Encounters:  11/26/20 227 lb (103 kg)  04/23/20 225 lb (102.1 kg)  04/21/20 225 lb 9.6 oz (102.3 kg)     Objective:    Vital Signs:  BP 131/79   Pulse 75   Ht 5\' 10"  (1.778 m)   Wt 227 lb (103 kg)   BMI 32.57 kg/m    Well nourished, well developed male in no acute distress.   ASSESSMENT & PLAN:    1.  Coronary artery disease involving bypass graft with other forms of angina: He is currently doing very well.  I recommend continuing medical therapy.  He reports significant improvement in angina since he has been using CPAP for  sleep apnea.  He cut down the dose of Ranexa back to 500 mg twice daily.  2.  Essential hypertension: Blood pressure is controlled on current medications.  3.  Hyperlipidemia: Continue treatment with Repatha. I reviewed most recent lipid profile which showed an LDL of 27.  4.  PVCs: He reports minimal palpitations at the present time with metoprolol and Ranexa.  5.  Obesity and physical deconditioning: I discussed with him the importance of healthy lifestyle changes.  He wants to get his weight down to 200 pounds.    COVID-19 Education: The signs and symptoms of COVID-19 were discussed with the patient and how to seek care for testing (follow up with PCP or arrange E-visit).  The importance of social distancing was discussed today.  Time:   Today, I have spent 15 minutes with the patient with telehealth technology discussing the above problems.     Medication Adjustments/Labs and Tests Ordered: Current medicines are reviewed at length with the patient today.  Concerns regarding medicines are outlined above.  Tests Ordered: No orders of the defined types were placed in this encounter.  Medication Changes: No orders of the defined types were placed in this encounter.   Disposition:  In person Follow up in 6 months  Signed, [name], MD  11/26/2020 4:00 PM    Chase Scott

## 2020-12-08 ENCOUNTER — Telehealth: Payer: Self-pay | Admitting: Cardiovascular Disease

## 2020-12-08 MED ORDER — REPATHA SURECLICK 140 MG/ML ~~LOC~~ SOAJ: SUBCUTANEOUS | 5 refills | Status: DC

## 2020-12-08 NOTE — Telephone Encounter (Signed)
Requested Prescriptions   Signed Prescriptions Disp Refills   REPATHA SURECLICK 320 MG/ML SOAJ 2 mL 5    Sig: INJECT 2MLS INTO THE SKIN EVERY 14 DAYS    Authorizing Provider: [name] A    Ordering User: Raelene Bott, [place] may fill for 90 day if permitted by patient's insurance plan.

## 2020-12-08 NOTE — Telephone Encounter (Signed)
*  STAT* If patient is at the pharmacy, call can be transferred to refill team.   1. Which medications need to be refilled? (please list name of each medication and dose if known)  Repatha 140 mg inj q 14 days   2. Which pharmacy/location (including street and city if local pharmacy) is medication to be sent to?   [facility] [address] [place]    3. Do they need a 30 day or 90 day supply? [place]

## 2020-12-10 ENCOUNTER — Other Ambulatory Visit: Payer: Self-pay | Admitting: Cardiovascular Disease

## 2020-12-10 ENCOUNTER — Other Ambulatory Visit: Payer: Self-pay

## 2020-12-10 MED ORDER — RANOLAZINE ER 500 MG PO TB12: 500.0000 mg | ORAL_TABLET | Freq: Two times a day (BID) | ORAL | 3 refills | Status: DC

## 2021-03-16 ENCOUNTER — Telehealth: Payer: Self-pay | Admitting: Cardiovascular Disease

## 2021-03-16 MED ORDER — REPATHA SURECLICK 140 MG/ML ~~LOC~~ SOAJ: SUBCUTANEOUS | 2 refills | Status: DC

## 2021-03-16 NOTE — Telephone Encounter (Signed)
Requested Prescriptions   Signed Prescriptions Disp Refills   REPATHA SURECLICK 712 MG/ML SOAJ 2 mL 2    Sig: INJECT 2MLS INTO THE SKIN EVERY 14 DAYS    Authorizing Provider: [name] A    Ordering User: Raelene Bott, Renwick Asman L  '

## 2021-03-16 NOTE — Telephone Encounter (Signed)
Patient has recently changed insurances and now needs all Rx's sent to [facility] [address]  *STAT* If patient is at the pharmacy, call can be transferred to refill team.   1. Which medications need to be refilled? (please list name of each medication and dose if known) repatha   2. Which pharmacy/location (including street and city if local pharmacy) is medication to be sent to?[facility]  3. Do they need a 30 day or 90 day supply? 30 with refills

## 2021-04-08 ENCOUNTER — Telehealth: Payer: Self-pay | Admitting: *Deleted

## 2021-04-08 NOTE — Telephone Encounter (Signed)
Pt has been approved for Repatha 140 mg/ml.  Effective from 04/08/2021 through 04/08/2022.

## 2021-04-08 NOTE — Telephone Encounter (Signed)
Pt requiring PA for Repatha 140 mg/ml. PA has been submitted via covermymeds. Awaiting Approval.  Coralyn Helling (Key: [id number]) Repatha 140MG /ML syringes  Your information has been submitted to [place]. [employer] [place] will review the request and notify you of the determination decision directly, typically within 3 business days of your submission and once all necessary information is received.  You will also receive your request decision electronically. To check for an update later, open the request again from your dashboard.  If [employer] [place] has not responded within the specified timeframe or if you have any questions about your PA submission, contact Chisholm Winslow directly at [facility]) [phone] or ([place]) [phone].

## 2021-05-26 ENCOUNTER — Ambulatory Visit: Payer: Medicare Other | Admitting: Medical

## 2021-05-26 ENCOUNTER — Telehealth: Payer: Self-pay | Admitting: Medical

## 2021-05-26 ENCOUNTER — Encounter: Payer: Self-pay | Admitting: Medical

## 2021-05-26 ENCOUNTER — Other Ambulatory Visit: Payer: Self-pay

## 2021-05-26 VITALS — BP 136/82 | HR 68 | Ht 70.0 in | Wt 226.4 lb

## 2021-05-26 DIAGNOSIS — I2510 Atherosclerotic heart disease of native coronary artery without angina pectoris: Secondary | ICD-10-CM

## 2021-05-26 DIAGNOSIS — E785 Hyperlipidemia, unspecified: Secondary | ICD-10-CM | POA: Diagnosis not present

## 2021-05-26 DIAGNOSIS — I10 Essential (primary) hypertension: Secondary | ICD-10-CM

## 2021-05-26 DIAGNOSIS — I493 Ventricular premature depolarization: Secondary | ICD-10-CM | POA: Diagnosis not present

## 2021-05-26 DIAGNOSIS — R7303 Prediabetes: Secondary | ICD-10-CM

## 2021-05-26 DIAGNOSIS — G4733 Obstructive sleep apnea (adult) (pediatric): Secondary | ICD-10-CM

## 2021-05-26 MED ORDER — ISOSORBIDE MONONITRATE ER 30 MG PO TB24: 30.0000 mg | ORAL_TABLET | Freq: Every day | ORAL | 3 refills | Status: DC

## 2021-05-26 MED ORDER — REPATHA SURECLICK 140 MG/ML ~~LOC~~ SOAJ: SUBCUTANEOUS | 2 refills | Status: DC

## 2021-05-26 NOTE — Progress Notes (Signed)
Cardiology Office Note:    Date:  05/26/2021   ID:  Chase Scott, Chase Scott 18-Dec-1955, MRN [id number]  PCP:  Baxter Hire, MD  [facility] Cardiologist:  [name], MD  [facility] Electrophysiologist:  None   Referring MD: Baxter Hire, MD   Chief Complaint: 6 month follow-up  History of Present Illness:    Chase Scott is a 65 y.o. male with a hx of CAD s/p CABG in 2004 with subsequent PCI of the Lcx and RCA, HFpEF, HTN, HLD who presents for 6 month follow-up.   Hospitalized in January 2019 for chest pain. Echo showed EF 50%, G1DD. Cath showed singificant 3V disease with patent stents in the left Cx and right coronaries arteries. Patent LIMA to LAD and SVG to diag. SVG to left Cx was chronically occluded with atretic RIMA to RCA. There was moderate ostial RCA stenosis with catheter-induced spasm. Has chronic chest pain, seemed to improve after he started using CPAP regularly.  H/o PVCs. Holter monitor in April 2019 showed 28,000 PVC sin 48 hours, 18% burden. Patient is generally asymptomatic.  He has h/o of HLD with intolerance to statins. He has been on Repatha with improvement.   He was last seen 11/26/20 by televisit. CP improved with CPAP. Patient self decreased Ranexa to 500mg  BID.   Today, the patient is overall doing well. Reports compliance with CPAP. Denies chest pain, shortness of breath, lower leg edema, orthopnea, pnd, palpitations. Doe sno formal activity. He is active throughout the day, doing yard work. Diet consists of mostly home-cooked meals. BP and HR good today. Follows regularly with PCP. He takes plavix, denies bleeding issues. A1C 5.7 01/2021. Borderline diabetes, he quit drinking sodas, cut down on sweets.   Labs 01/2021: Hgb 15.3, Hct 44 AST 21, ALT 24 Potassium 4.2 Sodium 140 Cr 1/1, BUN 15  LDL 53, HDL 45, TG 128, total chol 124  Past Medical History:  Diagnosis Date   Alcoholism ([place])    Anginal pain ([place])    CAD (coronary artery  disease)    a. 01/2003 s/p PCI to RCA;  b. 02/2003 s/ CABG x 4 (LIMA->LAD, RIMA->RCA, VG->Diag, VG->LCX; c. 2005 s/p PCI to LCX; d. 06/2012 Cath: LM nl, LAD 158m, LCX mild plaque, OM1 min irregs, OM2 patent stent, RCA 30m ISR, VG->LCX 100, VG->Diag ok, RIMA->RCA 100, LIMA->LAD ok, EF 50-55%-->Med Rx; d. 11/2017 Cath: stable anatomy, 2/4 patent grafts. LCX 30 ISR, RCA 50ost, 20 ISR-->Med Rx.   Chronic Chest pain    Diastolic dysfunction    a. 11/2017 Echo: EF 50-55%, no rwma, Gr1 DD, mildly dil LA; b. 02/2019 Echo: EF 50-55%, pseudonormalization. Mildly dil RA.   Dyslipidemia    Gastroesophageal reflux disease    Gastrointestinal symptoms    Chronic gastrointestinal symptoms   History of kidney stones    History of left shoulder fracture    Hyperlipidemia    Hypertensive cardiovascular disease    a. Labile BPs.   OSA (obstructive sleep apnea)    PVC's (premature ventricular contractions)    a. 2019 Holter: 18% PVC burden.   Rotator cuff syndrome     Past Surgical History:  Procedure Laterality Date   Arthroscopic Knee Surgery Right 2009   Arthroscopic Knee Surgery Left 10/2017   BACK SURGERY     Lower   CARDIAC CATHETERIZATION     COLONOSCOPY WITH PROPOFOL N/A 04/29/2018   Procedure: COLONOSCOPY WITH PROPOFOL;  Surgeon: Manya Silvas, MD;  Location: [facility] ENDOSCOPY;  Service: Endoscopy;  Laterality: N/A;   CORONARY ARTERY BYPASS GRAFT  April 2004   x four   ESOPHAGOGASTRODUODENOSCOPY (EGD) WITH PROPOFOL N/A 04/29/2018   Procedure: ESOPHAGOGASTRODUODENOSCOPY (EGD) WITH PROPOFOL;  Surgeon: Manya Silvas, MD;  Location[facility] ENDOSCOPY;  Service: Endoscopy;  Laterality: N/A;   LEFT HEART CATH AND CORONARY ANGIOGRAPHY N/A 12/14/2017   Procedure: LEFT HEART CATH AND CORONARY ANGIOGRAPHY;  Surgeon: Wellington Hampshire, MD;  Location: [facility] CV LAB;  Service: Cardiovascular;  Laterality: N/A;   LEFT HEART CATHETERIZATION WITH CORONARY/GRAFT ANGIOGRAM  07/02/2012   Procedure: LEFT HEART  CATHETERIZATION WITH Beatrix Fetters;  Surgeon: Burnell Blanks, MD;  Location: [facility] CATH LAB;  Service: Cardiovascular;;   TONSILLECTOMY      Current Medications: Current Meds  Medication Sig   Ascorbic Acid (VITAMIN C) 1000 MG tablet Take 1,000 mg by mouth daily.   aspirin EC 81 MG EC tablet Take 1 tablet (81 mg total) by mouth daily.   clopidogrel (PLAVIX) 75 MG tablet Take 1 tablet (75 mg total) by mouth daily.   esomeprazole (NEXIUM) 20 MG capsule Take 40 mg by mouth at bedtime.    metoprolol succinate (TOPROL-XL) 100 MG 24 hr tablet Take 150 mg by mouth daily. Take with or immediately following a meal.   nitroGLYCERIN (NITROSTAT) 0.4 MG SL tablet Place 1 tablet (0.4 mg total) under the tongue every 5 (five) minutes as needed for chest pain.   ranolazine (RANEXA) 500 MG 12 hr tablet Take 1 tablet (500 mg total) by mouth 2 (two) times daily.   sucralfate (CARAFATE) 1 g tablet Take 1 g by mouth as needed.    [DISCONTINUED] isosorbide mononitrate (IMDUR) 30 MG 24 hr tablet TAKE 1 TABLET DAILY   [DISCONTINUED] REPATHA SURECLICK 073 MG/ML SOAJ INJECT 2MLS INTO THE SKIN EVERY 14 DAYS     Allergies:   Penicillins, Sulfa antibiotics, Etodolac, Lipitor [atorvastatin calcium], Paxil [paroxetine hcl], Statins, and Sulfasalazine   Social History   Socioeconomic History   Marital status: Married    Spouse name: Not on file   Number of children: Not on file   Years of education: Not on file   Highest education level: Not on file  Occupational History   Occupation: "Maintenance work"  Tobacco Use   Smoking status: Former    Packs/day: 1.00    Years: 15.00    Pack years: 15.00    Types: Cigarettes    Quit date: 11/21/1999    Years since quitting: 21.5   Smokeless tobacco: Never  Vaping Use   Vaping Use: Never used  Substance and Sexual Activity   Alcohol use: No   Drug use: No   Sexual activity: Not on file  Other Topics Concern   Not on file  Social History Narrative    Lives in [place] with wife.  Works in Maintenance.   Social Determinants of Health   Financial Resource Strain: Not on file  Food Insecurity: Not on file  Transportation Needs: Not on file  Physical Activity: Not on file  Stress: Not on file  Social Connections: Not on file     Family History: The patient's family history includes Heart attack in his sister; Microcephaly in his sister; Other in his father and mother.  ROS:   Please see the history of present illness.     All other systems reviewed and are negative.  EKGs/Labs/Other Studies Reviewed:    The following studies were reviewed today:  Echo 02/2019  1. The  left ventricle has low normal systolic function, with an ejection  fraction of 50-55%. The cavity size was normal. Left ventricular diastolic  Doppler parameters are consistent with pseudonormalization.   2. The right ventricle has normal systolic function. The cavity was  normal. There is no increase in right ventricular wall thickness.   3. Right atrial size was mildly dilated.   4. No evidence of mitral valve stenosis.   5. The aortic valve is tricuspid.   6. The aortic root and ascending aorta are normal in size and structure.   7. The interatrial septum was not well visualized  Holter monitor 02/2018 The patient was monitored for 48 hours. 26% of the monitoring period was classified as noise. The predominant rhythm was sinus with an average rate of 71 bpm (range 52 to 140 bpm. The longest R-R interval was 1.6 seconds. Rare supraventricular ectopy was noted. There was frequent ventricular ectopy with isolated PVCs as well as couplets and brief ventricular runs lasting up to 5 beats. Total PVC burden was 18%. No sustained arrhythmia or prolonged pause was identified.   Predominantly sinus rhythm with frequent PVCs and brief runs of nonsustained ventricular tachycardia (up to 5 beats).  Rare PACs.  Cardiac cath 11/2017  Mid Cx lesion is 30% stenosed. Ost  LAD to Prox LAD lesion is 80% stenosed. Prox LAD lesion is 100% stenosed. Prox RCA to Mid RCA lesion is 20% stenosed. LIMA graft was visualized by angiography. The graft exhibits mild focal disease. Origin lesion is 30% stenosed. SVG graft was visualized by angiography. Origin to Prox Graft lesion is 100% stenosed. Origin lesion is 100% stenosed. LIMA graft was visualized by angiography and is normal in caliber. The graft exhibits minimal luminal irregularities. Ost RCA lesion is 50% stenosed.   1.  Significant underlying three-vessel coronary artery disease with patent stents in the left circumflex and right coronary arteries.  Patent LIMA to LAD and SVG to diagonal.  Known chronically occluded SVG to left circumflex and atretic RIMA to RCA (not needed given no obstructive disease in native vessels).  Moderate ostial RCA stenosis with catheter-induced spasm that improved with nitroglycerin. 2.  Normal left ventricular end-diastolic pressure.   Recommendations: Overall, no significant change in coronary anatomy since most recent cardiac catheterization.  Recommend continuing medical therapy.  I added Imdur for symptomatic relief.  If the patient continues to have significant dyspnea, outpatient pulmonary evaluation might be needed.  EKG:  EKG is ordered today.  The ekg ordered today demonstrates NSR with frequent PVCs, 68bpm, PRI 166ms, nonspecific ST/T wave changes, Qtc 439ms.   Recent Labs: No results found for requested labs within last 8760 hours.  Recent Lipid Panel    Component Value Date/Time   CHOL 80 10/10/2018 0920   CHOL 180 03/15/2018 1424   TRIG 43 10/10/2018 0920   HDL 58 10/10/2018 0920   HDL 53 03/15/2018 1424   CHOLHDL 1.4 10/10/2018 0920   VLDL 9 10/10/2018 0920   LDLCALC 13 10/10/2018 0920   LDLCALC 107 (H) 03/15/2018 1424    Physical Exam:    VS:  BP 136/82 (BP Location: Left Arm, Patient Position: Sitting, Cuff Size: Normal)   Pulse 68   Ht 5\' 10"  (8.295  m)   Wt 226 lb 6 oz (102.7 kg)   SpO2 98%   BMI 32.48 kg/m     Wt Readings from Last 3 Encounters:  05/26/21 226 lb 6 oz (102.7 kg)  11/26/20 227 lb (103 kg)  04/23/20  225 lb (102.1 kg)     GEN:  Well nourished, well developed in no acute distress HEENT: Normal NECK: No JVD; No carotid bruits LYMPHATICS: No lymphadenopathy CARDIAC: RRR, no murmurs, rubs, gallops RESPIRATORY:  Clear to auscultation without rales, wheezing or rhonchi  ABDOMEN: Soft, non-tender, non-distended MUSCULOSKELETAL:  No edema; No deformity  SKIN: Warm and dry NEUROLOGIC:  Alert and oriented x 3 PSYCHIATRIC:  Normal affect   ASSESSMENT:    1. Coronary artery disease involving native coronary artery of native heart without angina pectoris   2. Primary hypertension   3. PVC (premature ventricular contraction)   4. Hyperlipidemia LDL goal <70   5. OSA (obstructive sleep apnea)   6. Prediabetes    PLAN:    In order of problems listed above:  CAD s/p CABG with subsequent stenting Patient is overall doing well. He denies anginal symptoms. Previous chest pain improved with CPAP use. Last LHC 2019 showed known 3V disease with patents stents, patent LIMA to LAD and SVG to diag, known CTO SVG to LCX and atretic RIMA to RCA, recommending medical management. He does no formal activity, but stays relatively active throughout the day. Has made some diet changes as he is now pre-diabetic. Lifestyle changes encouraged. Continue Aspirin, plavix, Repatha, Imdur, and Ranexa. No further ischemic work-up at this time.  HTN BP and heart rate good today. Continue Toprol-XL 100mg  daily and Imdur 30 mg daily.   HLD LDL 53 in 01/3021. Continue Repatha. Follows with lipid clinic.  PVCs EKG with known frequent PVCs. Patient is asymptomatic.   Prediabetes A1C 5.7 in 01/2021. Diet and exercise encouraged.    Disposition: Follow up in 1 year(s) with MD     Signed, Alfio Loescher Ninfa Meeker, PA-C  05/26/2021 2:41 PM    [place]

## 2021-05-26 NOTE — Patient Instructions (Addendum)
Medication Instructions:  No changes, please continue your current medications   If you need a refill on your cardiac medications before your next appointment, please call your pharmacy.   Lab Work: None  Testing/Procedures: None  Follow-Up: At [facility], you and your health needs are our priority.  As part of our continuing mission to provide you with exceptional heart care, we have created designated Provider Care Teams.  These Care Teams include your primary Cardiologist (physician) and Advanced Practice Providers (APPs -  Physician Assistants and Nurse Practitioners) who all work together to provide you with the care you need, when you need it.   Your next appointment:   1 year(s)  The format for your next appointment:   In Person  Provider:   [name], MD

## 2021-05-26 NOTE — Telephone Encounter (Signed)
*  STAT* If patient is at the pharmacy, call can be transferred to refill team.   1. Which medications need to be refilled? (please list name of each medication and dose if known) Ranexa 500 mg po BID   2. Which pharmacy/location (including street and city if local pharmacy) is medication to be sent to?   [facility] [place]   3. Do they need a 30 day or 90 day supply? [place]

## 2021-05-27 MED ORDER — RANOLAZINE ER 500 MG PO TB12: 500.0000 mg | ORAL_TABLET | Freq: Two times a day (BID) | ORAL | 2 refills | Status: DC

## 2021-05-27 NOTE — Telephone Encounter (Signed)
Requested Prescriptions   Signed Prescriptions Disp Refills   ranolazine (RANEXA) 500 MG 12 hr tablet 180 tablet 2    Sig: Take 1 tablet (500 mg total) by mouth 2 (two) times daily.    Authorizing Provider: Antony Madura    Ordering User: Raelene Bott, Justino Boze L

## 2021-05-30 ENCOUNTER — Other Ambulatory Visit: Payer: Self-pay

## 2021-05-30 MED ORDER — REPATHA SURECLICK 140 MG/ML ~~LOC~~ SOAJ: 1.0000 "pen " | SUBCUTANEOUS | 6 refills | Status: DC

## 2021-07-20 ENCOUNTER — Telehealth: Payer: Self-pay

## 2021-07-20 NOTE — Telephone Encounter (Signed)
-----   Message from Leeroy Bock, [place] sent at 07/20/2021  4:15 PM EDT ----- Got a fax back from [facility] on his [place] saying he doesn't have active coverage with them, may need to call to see if he got new insurance

## 2021-07-20 NOTE — Telephone Encounter (Signed)
Lmom to call back to give me his correct insurance information

## 2021-07-21 NOTE — Telephone Encounter (Signed)
Javier Glazier from [facility] is returning call from  yesterday in regards to [place]. She can be reached at  [phone] Option 5

## 2021-07-21 NOTE — Telephone Encounter (Signed)
Returned the call by faxing information to [phone]

## 2021-07-26 ENCOUNTER — Telehealth: Payer: Self-pay | Admitting: Cardiovascular Disease

## 2021-07-26 NOTE — Telephone Encounter (Signed)
   [facility] Pre-operative Risk Assessment    Patient Name: Chase Scott  DOB: 11/09/1956 MRN: [id number]  HEARTCARE STAFF:  - IMPORTANT!!!!!! Under Visit Info/Reason for Call, type in Other and utilize the format Clearance MM/DD/YY or Clearance TBD. Do not use dashes or single digits. - Please review there is not already an duplicate clearance open for this procedure. - If request is for dental extraction, please clarify the # of teeth to be extracted. - If the patient is currently at the dentist's office, call Pre-Op Callback Staff (MA/nurse) to input urgent request.  - If the patient is not currently in the dentist office, please route to the Pre-Op pool.  Request for surgical clearance:  What type of surgery is being performed? 2 extractions   When is this surgery scheduled? TBD  What type of clearance is required (medical clearance vs. Pharmacy clearance to hold med vs. Both)? both  Are there any medications that need to be held prior to surgery and how long? Plavix instructions   Practice name and name of physician performing surgery? [facility]  What is the office phone number? [phone]   7.   What is the office fax number?   8.   Anesthesia type (None, local, MAC, general) ? Not listed   Ace Gins 07/26/2021, 4:17 PM  _________________________________________________________________  Patient brought by office - fax number not listed on form - called office to receive but office closes at 4:00p

## 2021-07-27 NOTE — Telephone Encounter (Signed)
I will fax clearance over to DDS office

## 2021-07-27 NOTE — Telephone Encounter (Signed)
   Patient Name: Chase Scott  DOB: 04/23/1956 MRN: [id number]  Primary Cardiologist: [name], MD  Chart reviewed as part of pre-operative protocol coverage.   Simple dental extractions (1-2 teeth) are considered low risk procedures per guidelines and generally do not require any specific cardiac clearance. It is also generally accepted that for simple extractions and dental cleanings, there is no need to interrupt blood thinner therapy.   SBE prophylaxis is not required for the patient from a cardiac standpoint.  I will route this recommendation to the requesting party via Epic fax function and remove from pre-op pool.  Please call with questions.  [place], PA 07/27/2021, 7:23 AM

## 2021-07-27 NOTE — Telephone Encounter (Signed)
Left message for dental office to call us with a fax # so that we may fax over clearance.

## 2021-07-27 NOTE — Telephone Encounter (Signed)
Follow Up:   [facility] called back and said their fax number is [phone]. She said if possible they would like if you could e-mail this instead of faxing it. Please e-mail to [email or web address]

## 2021-11-18 ENCOUNTER — Other Ambulatory Visit: Payer: Self-pay | Admitting: Physician Assistant

## 2021-11-18 ENCOUNTER — Other Ambulatory Visit (HOSPITAL_COMMUNITY): Payer: Self-pay | Admitting: Physician Assistant

## 2021-11-18 DIAGNOSIS — M5416 Radiculopathy, lumbar region: Secondary | ICD-10-CM

## 2021-11-21 ENCOUNTER — Encounter (HOSPITAL_COMMUNITY): Payer: Self-pay | Admitting: Emergency Medicine

## 2021-11-21 ENCOUNTER — Emergency Department (HOSPITAL_COMMUNITY)
Admission: EM | Admit: 2021-11-21 | Discharge: 2021-11-21 | Disposition: A | Discharge status: Home / Self Care | Payer: Medicare Other | Attending: Emergency Medicine | Admitting: Emergency Medicine

## 2021-11-21 DIAGNOSIS — Z79899 Other long term (current) drug therapy: Secondary | ICD-10-CM | POA: Insufficient documentation

## 2021-11-21 DIAGNOSIS — M5431 Sciatica, right side: Secondary | ICD-10-CM

## 2021-11-21 DIAGNOSIS — M5441 Lumbago with sciatica, right side: Secondary | ICD-10-CM | POA: Diagnosis not present

## 2021-11-21 DIAGNOSIS — Z7982 Long term (current) use of aspirin: Secondary | ICD-10-CM | POA: Insufficient documentation

## 2021-11-21 DIAGNOSIS — M545 Low back pain, unspecified: Secondary | ICD-10-CM | POA: Diagnosis present

## 2021-11-21 DIAGNOSIS — Z7902 Long term (current) use of antithrombotics/antiplatelets: Secondary | ICD-10-CM | POA: Diagnosis not present

## 2021-11-21 MED ORDER — HYDROMORPHONE HCL 2 MG PO TABS
4.0000 mg | ORAL_TABLET | Freq: Once | ORAL | Status: AC
  Administered 2021-11-21: 4 mg via ORAL
  Filled 2021-11-21: qty 2

## 2021-11-21 MED ORDER — HYDROMORPHONE HCL 4 MG PO TABS: 4.0000 mg | ORAL_TABLET | ORAL | 0 refills | Status: DC | PRN

## 2021-11-21 MED ORDER — METHOCARBAMOL 1000 MG PO TABS: 1000.0000 mg | ORAL_TABLET | Freq: Three times a day (TID) | ORAL | 0 refills | Status: DC | PRN

## 2021-11-21 MED ORDER — HYDROMORPHONE HCL 1 MG/ML IJ SOLN
2.0000 mg | Freq: Once | INTRAMUSCULAR | Status: AC
  Administered 2021-11-21: 2 mg via INTRAMUSCULAR
  Filled 2021-11-21: qty 2

## 2021-11-21 NOTE — ED Provider Notes (Signed)
[facility] EMERGENCY DEPARTMENT Provider Note   CSN: [id number] Arrival date & time: 11/21/21  1217     History  Chief Complaint  Patient presents with   Back Pain    Chase Scott is a 66 y.o. male.  HPI 66 year old male presents with severe back pain. History is from patient and wife at the bedside.  Patient has been dealing with back pain since about 12/22.  Saw his doctor and was started on steroids and tramadol.  Some minor improvement.  However the pain has been unbearable and while he is due for an MRI on 1/14 he presents today hoping to get an MRI today.  He denies fevers, bowel/bladder incontinence, weakness in the lower extremities.  His right leg is very painful to move but is not sure its weak.  There is some numbness to his right inner thigh.  The pain and numbness runs down his right lower back into his right groin and into his right inner thigh.  Pain is rated as severe.  He has a prior history of back surgery maybe 40 years ago but otherwise denies any new trauma.  Home Medications Prior to Admission medications   Medication Sig Start Date End Date Taking? Authorizing Provider  HYDROmorphone (DILAUDID) 4 MG tablet Take 1 tablet (4 mg total) by mouth every 4 (four) hours as needed for severe pain. 11/21/21  Yes Sherwood Gambler, MD  methocarbamol 1000 MG TABS Take 1,000 mg by mouth every 8 (eight) hours as needed for muscle spasms. 11/21/21  Yes Sherwood Gambler, MD  Ascorbic Acid (VITAMIN C) 1000 MG tablet Take 1,000 mg by mouth daily.    [provider]  aspirin EC 81 MG EC tablet Take 1 tablet (81 mg total) by mouth daily. 12/15/17   Salary, Holly Bodily D, MD  clopidogrel (PLAVIX) 75 MG tablet Take 1 tablet (75 mg total) by mouth daily. 08/23/18   Wellington Hampshire, MD  esomeprazole (NEXIUM) 20 MG capsule Take 40 mg by mouth at bedtime.     [provider]  isosorbide mononitrate (IMDUR) 30 MG 24 hr tablet Take 1 tablet (30 mg total) by mouth  daily. 05/26/21   Furth, Cadence H, PA-C  metoprolol succinate (TOPROL-XL) 100 MG 24 hr tablet Take 150 mg by mouth daily. Take with or immediately following a meal.    [provider]  nitroGLYCERIN (NITROSTAT) 0.4 MG SL tablet Place 1 tablet (0.4 mg total) under the tongue every 5 (five) minutes as needed for chest pain. 01/05/19   Mayo, Pete Pelt, MD  ranolazine (RANEXA) 500 MG 12 hr tablet Take 1 tablet (500 mg total) by mouth 2 (two) times daily. 05/27/21   Furth, Cadence H, PA-C  REPATHA SURECLICK 811 MG/ML SOAJ Inject 1 pen into the muscle every 14 (fourteen) days. 05/30/21   Furth, Cadence H, PA-C  sucralfate (CARAFATE) 1 g tablet Take 1 g by mouth as needed.     [provider]      Allergies    Penicillins, Sulfa antibiotics, Etodolac, Lipitor [atorvastatin calcium], Paxil [paroxetine hcl], Statins, and Sulfasalazine    Review of Systems   Review of Systems  Constitutional:  Negative for fever.  Gastrointestinal:  Negative for abdominal pain.  Genitourinary:        No incontinence  Musculoskeletal:  Positive for back pain.  Neurological:  Positive for numbness. Negative for weakness.   Physical Exam Updated Vital Signs BP (!) 177/85 (BP Location: Right Arm)  Pulse 73    Temp 98.5 F (36.9 C) (Oral)    Resp 18    SpO2 97%  Physical Exam Vitals and nursing note reviewed.  Constitutional:      Appearance: He is well-developed. He is obese.  HENT:     Head: Normocephalic and atraumatic.     Right Ear: External ear normal.     Left Ear: External ear normal.     Nose: Nose normal.  Eyes:     General:        Right eye: No discharge.        Left eye: No discharge.  Cardiovascular:     Rate and Rhythm: Normal rate and regular rhythm.     Heart sounds: Normal heart sounds.  Pulmonary:     Effort: Pulmonary effort is normal.     Breath sounds: Normal breath sounds.  Abdominal:     Palpations: Abdomen is soft.     Tenderness: There is no abdominal tenderness.   Musculoskeletal:     Cervical back: Neck supple.     Thoracic back: No tenderness or bony tenderness.     Lumbar back: No tenderness or bony tenderness.       Back:  Skin:    General: Skin is warm and dry.  Neurological:     Mental Status: He is alert.     Comments: Subjective decreased sensation in the right inner thigh.  Otherwise has grossly normal sensation in both lower extremities.  Lower legs have normal strength.  Perhaps some decreased strength in the right thigh but it seems more pain related.  Normal strength in the left lower extremity.  Psychiatric:        Mood and Affect: Mood is not anxious.    ED Results / Procedures / Treatments   Labs (all labs ordered are listed, but only abnormal results are displayed) Labs Reviewed - No data to display  EKG None  Radiology No results found.  Procedures Procedures    Medications Ordered in ED Medications  HYDROmorphone (DILAUDID) injection 2 mg (2 mg Intramuscular Given 11/21/21 2059)  HYDROmorphone (DILAUDID) tablet 4 mg (4 mg Oral Given 11/21/21 2139)    ED Course/ Medical Decision Making/ A&P                           Medical Decision Making  Patient appears to have lumbar radiculopathy/sciatica.  On history and exam he has no red flags including no weakness, bowel/bladder incontinence, fever or saddle anesthesia.  He had a little bit of pain with moving his right leg prior to pain control but then was given IM Dilaudid and is moving his leg a lot freer now.  I do not suspect that he has an acute spinal cord emergency and thus I do not think an emergent MRI is warranted.  I have discussed this with him and his wife.  Otherwise, I have viewed his most recent PCP visit on 12/27 where he had an x-ray performed and was started on tramadol in addition to being given a one-time dose of Kenalog and prednisone taper.  He should continue the steroids and I will also adjust his meds to include p.o. Dilaudid for better pain control  as well as Robaxin.  He needs to stop the tramadol.  Otherwise, I do not suspect other intra-abdominal or retroperitoneal emergency such as kidney stone, infection, etc.  I do not think any acute imaging is warranted.  Given his improvement in pain control, he appears stable for discharge home.  He would like a referral to a spine specialist.  Discharged home with return precautions.        Final Clinical Impression(s) / ED Diagnoses Final diagnoses:  Right sided sciatica    Rx / DC Orders ED Discharge Orders          Ordered    HYDROmorphone (DILAUDID) 4 MG tablet  Every 4 hours PRN        11/21/21 2131    methocarbamol 1000 MG TABS  Every 8 hours PRN        11/21/21 2131              Sherwood Gambler, MD 11/21/21 2232

## 2021-11-21 NOTE — Discharge Instructions (Addendum)
STOP taking the TRAMADOL. You are being prescribed Dilaudid for pain, which is a narcotic.   You are also being prescribed Robaxin which is a muscle relaxer.  Do not take at the same time as the Dilaudid.  Do not drive or operate heavy machinery with either of these medicines.  You are still allowed to take Tylenol, up to 4000 mg/day.  If you develop worsening, recurrent, or continued back pain, numbness or weakness in the legs, incontinence of your bowels or bladders, numbness of your buttocks, fever, abdominal pain, or any other new/concerning symptoms then return to the ER for evaluation.

## 2021-11-21 NOTE — ED Notes (Signed)
RN reviewed discharge instructions w/ pt. Follow up, prescriptions and pain management reviewed. Pt had no further questions.

## 2021-11-21 NOTE — ED Triage Notes (Signed)
Patient complains of lower right back pain that started on 11/10/2021 upon waking.  Patient seen at PCP in late December and given steroid injection and prescription for tramadol. Patient has MRI scheduled 12/03/2021 by PCP but states pain has become too severe to wait for MRI. PMS intact in right leg, denies urinary and bowel incontinence. Patient is alert, oriented, and in no apparent distress at this time.

## 2021-12-03 ENCOUNTER — Ambulatory Visit
Admission: RE | Admit: 2021-12-03 | Discharge: 2021-12-03 | Disposition: A | Discharge status: Home / Self Care | Payer: Medicare Other | Source: Ambulatory Visit | Attending: Physician Assistant | Admitting: Physician Assistant

## 2021-12-03 ENCOUNTER — Other Ambulatory Visit: Payer: Self-pay

## 2021-12-03 DIAGNOSIS — M5416 Radiculopathy, lumbar region: Secondary | ICD-10-CM | POA: Insufficient documentation

## 2021-12-20 ENCOUNTER — Other Ambulatory Visit: Payer: Self-pay | Admitting: Medical

## 2021-12-21 ENCOUNTER — Encounter (HOSPITAL_COMMUNITY): Payer: Self-pay | Admitting: Emergency Medicine

## 2021-12-21 ENCOUNTER — Observation Stay (HOSPITAL_COMMUNITY)
Admission: EM | Admit: 2021-12-21 | Discharge: 2021-12-22 | Disposition: A | Discharge status: Home / Self Care | Payer: Medicare Other | Attending: Emergency Medicine | Admitting: Emergency Medicine

## 2021-12-21 ENCOUNTER — Emergency Department (HOSPITAL_COMMUNITY): Payer: Medicare Other

## 2021-12-21 DIAGNOSIS — I10 Essential (primary) hypertension: Secondary | ICD-10-CM | POA: Diagnosis present

## 2021-12-21 DIAGNOSIS — Z20822 Contact with and (suspected) exposure to covid-19: Secondary | ICD-10-CM | POA: Diagnosis not present

## 2021-12-21 DIAGNOSIS — Z7902 Long term (current) use of antithrombotics/antiplatelets: Secondary | ICD-10-CM | POA: Diagnosis not present

## 2021-12-21 DIAGNOSIS — M549 Dorsalgia, unspecified: Secondary | ICD-10-CM

## 2021-12-21 DIAGNOSIS — Z79899 Other long term (current) drug therapy: Secondary | ICD-10-CM | POA: Insufficient documentation

## 2021-12-21 DIAGNOSIS — M5417 Radiculopathy, lumbosacral region: Principal | ICD-10-CM

## 2021-12-21 DIAGNOSIS — I2510 Atherosclerotic heart disease of native coronary artery without angina pectoris: Secondary | ICD-10-CM | POA: Diagnosis present

## 2021-12-21 DIAGNOSIS — K219 Gastro-esophageal reflux disease without esophagitis: Secondary | ICD-10-CM | POA: Diagnosis present

## 2021-12-21 DIAGNOSIS — R7303 Prediabetes: Secondary | ICD-10-CM | POA: Diagnosis not present

## 2021-12-21 DIAGNOSIS — Z7982 Long term (current) use of aspirin: Secondary | ICD-10-CM | POA: Insufficient documentation

## 2021-12-21 DIAGNOSIS — Z87891 Personal history of nicotine dependence: Secondary | ICD-10-CM | POA: Insufficient documentation

## 2021-12-21 DIAGNOSIS — G4733 Obstructive sleep apnea (adult) (pediatric): Secondary | ICD-10-CM | POA: Diagnosis present

## 2021-12-21 DIAGNOSIS — I110 Hypertensive heart disease with heart failure: Secondary | ICD-10-CM | POA: Insufficient documentation

## 2021-12-21 DIAGNOSIS — Z951 Presence of aortocoronary bypass graft: Secondary | ICD-10-CM | POA: Diagnosis not present

## 2021-12-21 DIAGNOSIS — R2681 Unsteadiness on feet: Secondary | ICD-10-CM | POA: Diagnosis not present

## 2021-12-21 DIAGNOSIS — M5441 Lumbago with sciatica, right side: Secondary | ICD-10-CM

## 2021-12-21 DIAGNOSIS — I5032 Chronic diastolic (congestive) heart failure: Secondary | ICD-10-CM | POA: Diagnosis not present

## 2021-12-21 DIAGNOSIS — E7800 Pure hypercholesterolemia, unspecified: Secondary | ICD-10-CM | POA: Diagnosis present

## 2021-12-21 LAB — CBC WITH DIFFERENTIAL/PLATELET
Abs Immature Granulocytes: 0.07 10*3/uL (ref 0.00–0.07)
Basophils Absolute: 0.1 10*3/uL (ref 0.0–0.1)
Basophils Relative: 1 %
Eosinophils Absolute: 0.1 10*3/uL (ref 0.0–0.5)
Eosinophils Relative: 1 %
HCT: 44.4 % (ref 39.0–52.0)
Hemoglobin: 15.6 g/dL (ref 13.0–17.0)
Immature Granulocytes: 1 %
Lymphocytes Relative: 30 %
Lymphs Abs: 2.7 10*3/uL (ref 0.7–4.0)
MCH: 32.7 pg (ref 26.0–34.0)
MCHC: 35.1 g/dL (ref 30.0–36.0)
MCV: 93.1 fL (ref 80.0–100.0)
Monocytes Absolute: 0.7 10*3/uL (ref 0.1–1.0)
Monocytes Relative: 8 %
Neutro Abs: 5.5 10*3/uL (ref 1.7–7.7)
Neutrophils Relative %: 59 %
Platelets: 290 10*3/uL (ref 150–400)
RBC: 4.77 MIL/uL (ref 4.22–5.81)
RDW: 11.9 % (ref 11.5–15.5)
WBC: 9 10*3/uL (ref 4.0–10.5)
nRBC: 0 % (ref 0.0–0.2)

## 2021-12-21 LAB — BASIC METABOLIC PANEL
Anion gap: 8 (ref 5–15)
BUN: 15 mg/dL (ref 8–23)
CO2: 24 mmol/L (ref 22–32)
Calcium: 9.1 mg/dL (ref 8.9–10.3)
Chloride: 105 mmol/L (ref 98–111)
Creatinine, Ser: 1.18 mg/dL (ref 0.61–1.24)
GFR, Estimated: >60 mL/min (ref >60)
Glucose, Bld: 107 mg/dL — ABNORMAL HIGH (ref 70–99)
Potassium: 4.3 mmol/L (ref 3.5–5.1)
Sodium: 137 mmol/L (ref 135–145)

## 2021-12-21 LAB — RESP PANEL BY RT-PCR (FLU A&B, COVID) ARPGX2
Influenza A by PCR: NEGATIVE
Influenza B by PCR: NEGATIVE
SARS Coronavirus 2 by RT PCR: NEGATIVE

## 2021-12-21 MED ORDER — DEXAMETHASONE SODIUM PHOSPHATE 10 MG/ML IJ SOLN
10.0000 mg | Freq: Once | INTRAMUSCULAR | Status: AC
  Administered 2021-12-21: 10 mg via INTRAVENOUS
  Filled 2021-12-21: qty 1

## 2021-12-21 MED ORDER — FENTANYL CITRATE PF 50 MCG/ML IJ SOSY
50.0000 ug | PREFILLED_SYRINGE | Freq: Once | INTRAMUSCULAR | Status: AC
  Administered 2021-12-21: 50 ug via INTRAVENOUS
  Filled 2021-12-21: qty 1

## 2021-12-21 MED ORDER — HYDROMORPHONE HCL 1 MG/ML IJ SOLN
0.5000 mg | INTRAMUSCULAR | Status: DC | PRN
  Administered 2021-12-21 – 2021-12-22 (×3): 1 mg via INTRAVENOUS
  Filled 2021-12-21 (×3): qty 1

## 2021-12-21 MED ORDER — HYDROCODONE-ACETAMINOPHEN 5-325 MG PO TABS: 1.0000 | ORAL_TABLET | ORAL | Status: DC

## 2021-12-21 MED ORDER — HYDROMORPHONE HCL 1 MG/ML IJ SOLN
1.0000 mg | INTRAMUSCULAR | Status: DC | PRN
  Administered 2021-12-21: 1 mg via INTRAVENOUS
  Filled 2021-12-21: qty 1

## 2021-12-21 MED ORDER — CLOPIDOGREL BISULFATE 75 MG PO TABS
75.0000 mg | ORAL_TABLET | Freq: Every day | ORAL | Status: DC
  Administered 2021-12-22: 75 mg via ORAL
  Filled 2021-12-21: qty 1

## 2021-12-21 MED ORDER — SENNA 8.6 MG PO TABS
1.0000 | ORAL_TABLET | Freq: Every day | ORAL | Status: DC
  Administered 2021-12-22: 8.6 mg via ORAL
  Filled 2021-12-21 (×2): qty 1

## 2021-12-21 MED ORDER — ASPIRIN EC 81 MG PO TBEC
81.0000 mg | DELAYED_RELEASE_TABLET | Freq: Every day | ORAL | Status: DC
  Administered 2021-12-22: 81 mg via ORAL
  Filled 2021-12-21: qty 1

## 2021-12-21 MED ORDER — RIVAROXABAN 10 MG PO TABS
10.0000 mg | ORAL_TABLET | Freq: Every day | ORAL | Status: DC
  Administered 2021-12-22: 10 mg via ORAL
  Filled 2021-12-21: qty 1

## 2021-12-21 MED ORDER — ISOSORBIDE MONONITRATE ER 30 MG PO TB24
30.0000 mg | ORAL_TABLET | Freq: Every day | ORAL | Status: DC
  Administered 2021-12-22: 30 mg via ORAL
  Filled 2021-12-21: qty 1

## 2021-12-21 MED ORDER — METOPROLOL SUCCINATE ER 50 MG PO TB24
50.0000 mg | ORAL_TABLET | Freq: Every evening | ORAL | Status: DC
  Administered 2021-12-21: 50 mg via ORAL
  Filled 2021-12-21: qty 2

## 2021-12-21 MED ORDER — METOPROLOL SUCCINATE ER 100 MG PO TB24
100.0000 mg | ORAL_TABLET | Freq: Every day | ORAL | Status: DC
  Administered 2021-12-22: 100 mg via ORAL
  Filled 2021-12-21: qty 1

## 2021-12-21 MED ORDER — LORAZEPAM 2 MG/ML IJ SOLN
1.0000 mg | INTRAMUSCULAR | Status: AC | PRN
  Administered 2021-12-21: 1 mg via INTRAVENOUS
  Filled 2021-12-21: qty 1

## 2021-12-21 MED ORDER — GABAPENTIN 600 MG PO TABS
300.0000 mg | ORAL_TABLET | Freq: Three times a day (TID) | ORAL | Status: DC
  Filled 2021-12-21: qty 0.5

## 2021-12-21 MED ORDER — HYDROCODONE-ACETAMINOPHEN 5-325 MG PO TABS
1.0000 | ORAL_TABLET | ORAL | Status: DC
Start: 2021-12-21 — End: 2021-12-22
  Administered 2021-12-21 – 2021-12-22 (×4): 1 via ORAL
  Filled 2021-12-21 (×4): qty 1

## 2021-12-21 MED ORDER — RANOLAZINE ER 500 MG PO TB12
500.0000 mg | ORAL_TABLET | Freq: Two times a day (BID) | ORAL | Status: DC
  Administered 2021-12-21 – 2021-12-22 (×2): 500 mg via ORAL
  Filled 2021-12-21 (×4): qty 1

## 2021-12-21 MED ORDER — HYDROCODONE-ACETAMINOPHEN 5-325 MG PO TABS: 1.0000 | ORAL_TABLET | ORAL | Status: DC | PRN

## 2021-12-21 MED ORDER — ENOXAPARIN SODIUM 40 MG/0.4ML IJ SOSY: 40.0000 mg | PREFILLED_SYRINGE | INTRAMUSCULAR | Status: DC

## 2021-12-21 NOTE — ED Provider Notes (Addendum)
[facility] EMERGENCY DEPARTMENT Provider Note   CSN: [id number] Arrival date & time: 12/21/21  1115     History  Chief Complaint  Patient presents with   Back Pain    Chase Scott is a 66 y.o. male.   Back Pain Associated symptoms: numbness and weakness    66 year old male with a history of degenerative changes of the lumbar spine with central disc protrusion with mild bilateral recess stenosis and effect upon the bilateral descending S1 nerve roots with for medial stenosis at L5-S1 and L3-S4 right extraforaminal protrusion and endplate spurring resulting in right for aminal stenosis that is mild with mass effect upon the extraforaminal right L3 nerve root per MRI imaging 12/03/2021, follows outpatient with Dr. Venetia Constable of neurosurgery who presents to the emergency department today with sudden onset worsening radicular pain and weakness in his right leg.  The patient states that he has been managing his pain outpatient with oral Dilaudid.  He was brushing his teeth this morning and had been bending over when he straighten his back and suddenly experienced sharp and shooting pain that radiated down his right leg.  He endorses numbness along his right thigh with new weakness in his right lower extremity.  He states that he cannot walk.  He denies any urinary incontinence or fecal incontinence.  He last received a steroid injection in his back 2 weeks ago.  He denies any fevers or chills.  He denies any gradual worsening of symptoms but endorses a sudden worsening of symptoms this morning upon straightening his back.  No saddle anesthesia.  Home Medications Prior to Admission medications   Medication Sig Start Date End Date Taking? Authorizing Provider  Ascorbic Acid (VITAMIN C) 1000 MG tablet Take 1,000 mg by mouth daily.    [provider]  aspirin EC 81 MG EC tablet Take 1 tablet (81 mg total) by mouth daily. 12/15/17   Salary, Holly Bodily D, MD  clopidogrel  (PLAVIX) 75 MG tablet Take 1 tablet (75 mg total) by mouth daily. 08/23/18   Wellington Hampshire, MD  esomeprazole (NEXIUM) 20 MG capsule Take 40 mg by mouth at bedtime.     [provider]  HYDROmorphone (DILAUDID) 4 MG tablet Take 1 tablet (4 mg total) by mouth every 4 (four) hours as needed for severe pain. 11/21/21   Sherwood Gambler, MD  isosorbide mononitrate (IMDUR) 30 MG 24 hr tablet Take 1 tablet (30 mg total) by mouth daily. 05/26/21   Furth, Cadence H, PA-C  methocarbamol 1000 MG TABS Take 1,000 mg by mouth every 8 (eight) hours as needed for muscle spasms. 11/21/21   Sherwood Gambler, MD  metoprolol succinate (TOPROL-XL) 100 MG 24 hr tablet Take 150 mg by mouth daily. Take with or immediately following a meal.    [provider]  nitroGLYCERIN (NITROSTAT) 0.4 MG SL tablet Place 1 tablet (0.4 mg total) under the tongue every 5 (five) minutes as needed for chest pain. 01/05/19   Mayo, Pete Pelt, MD  ranolazine (RANEXA) 500 MG 12 hr tablet Take 1 tablet (500 mg total) by mouth 2 (two) times daily. 05/27/21   Furth, Cadence H, PA-C  REPATHA SURECLICK 409 MG/ML SOAJ INJECT 1 PEN INTO THE MUSCLE EVERY 14 DAYS 12/20/21   Furth, Cadence H, PA-C  sucralfate (CARAFATE) 1 g tablet Take 1 g by mouth as needed.     [provider]      Allergies    Penicillins, Sulfa antibiotics, Etodolac, Lipitor [  atorvastatin calcium], Paxil [paroxetine hcl], Statins, and Sulfasalazine    Review of Systems   Review of Systems  Musculoskeletal:  Positive for back pain.  Neurological:  Positive for weakness and numbness.  All other systems reviewed and are negative.  Physical Exam Updated Vital Signs BP (!) 155/89 (BP Location: Left Arm)    Pulse 79    Temp 98.2 F (36.8 C) (Oral)    Resp 18    SpO2 91%  Physical Exam Vitals and nursing note reviewed.  Constitutional:      General: He is not in acute distress.    Appearance: He is well-developed.  HENT:     Head: Normocephalic and  atraumatic.  Eyes:     Conjunctiva/sclera: Conjunctivae normal.     Pupils: Pupils are equal, round, and reactive to light.  Cardiovascular:     Rate and Rhythm: Normal rate and regular rhythm.     Heart sounds: No murmur heard. Pulmonary:     Effort: Pulmonary effort is normal. No respiratory distress.     Breath sounds: Normal breath sounds.  Abdominal:     General: There is no distension.     Palpations: Abdomen is soft.     Tenderness: There is no abdominal tenderness. There is no guarding.  Musculoskeletal:        General: No swelling, deformity or signs of injury.     Cervical back: Neck supple.     Comments: Positive straight leg raise test on the right.  Skin:    General: Skin is warm and dry.     Capillary Refill: Capillary refill takes less than 2 seconds.     Findings: No lesion or rash.  Neurological:     General: No focal deficit present.     Mental Status: He is alert. Mental status is at baseline.     Comments: 4 out of 5 strength in the right lower extremity, 5 out of 5 strength in the left lower extremity, intact sensation to light touch bilaterally in the lower extremities.  Intact strength and sensation in the bilateral upper extremities.  No obvious cranial nerve deficit.  Psychiatric:        Mood and Affect: Mood normal.    ED Results / Procedures / Treatments   Labs (all labs ordered are listed, but only abnormal results are displayed) Labs Reviewed  BASIC METABOLIC PANEL - Abnormal; Notable for the following components:      Result Value   Glucose, Bld 107 (*)    All other components within normal limits  RESP PANEL BY RT-PCR (FLU A&B, COVID) ARPGX2  CBC WITH DIFFERENTIAL/PLATELET    EKG None  Radiology MR LUMBAR SPINE WO CONTRAST  Result Date: 12/21/2021 CLINICAL DATA:  Low back pain.  Cauda equina syndrome suspected. EXAM: MRI LUMBAR SPINE WITHOUT CONTRAST TECHNIQUE: Multiplanar, multisequence MR imaging of the lumbar spine was performed. No  intravenous contrast was administered. COMPARISON:  MRI lumbar spine 12/03/2021 FINDINGS: Segmentation:  5 lumbar vertebra. Alignment:  Mild retrolisthesis L5-S1.  Otherwise normal alignment Vertebrae: Negative for fracture or mass. No evidence of spinal infection. Conus medullaris and cauda equina: Conus extends to the L1 level. Conus and cauda equina appear normal. Paraspinal and other soft tissues: Negative for paraspinous mass adenopathy or fluid collection. Disc levels: Motion degraded study particularly the axial degrading evaluation of neural impingement. L1-2: Negative L2-3: Moderate disc degeneration with disc space narrowing. Central disc protrusion and spurring unchanged. Mild spinal stenosis L3-4: Shallow extraforaminal disc protrusion  on the right unchanged with displacement of the right L3 nerve root lateral the foramen. Mild right foraminal narrowing. Spinal canal adequate in size. Mild facet degeneration L4-5: Mild disc bulging and mild facet degeneration. Negative for stenosis L5-S1: Left laminectomy. Central disc protrusion. Progressive disc protrusion on the left with left S1 nerve root impingement. Moderate facet degeneration bilaterally. Mild flattening of the right S1 nerve root due to subarticular stenosis. IMPRESSION: Mild spinal stenosis L2-3 unchanged Shallow extraforaminal disc protrusion on the right L3-4 unchanged Left laminectomy L5-S1. Progression of central disc protrusion L5-S1 with left S1 nerve root impingement. Electronically Signed   By: Franchot Gallo M.D.   On: 12/21/2021 16:54    Procedures Procedures    Medications Ordered in ED Medications  HYDROmorphone (DILAUDID) injection 1 mg (1 mg Intravenous Given 12/21/21 1604)  fentaNYL (SUBLIMAZE) injection 50 mcg (50 mcg Intravenous Given 12/21/21 1939)  LORazepam (ATIVAN) injection 1 mg (1 mg Intravenous Given 12/21/21 1607)  dexamethasone (DECADRON) injection 10 mg (10 mg Intravenous Given 12/21/21 1840)    ED Course/  Medical Decision Making/ A&P                           Medical Decision Making Amount and/or Complexity of Data Reviewed Labs: ordered. Radiology: ordered.  Risk Prescription drug management. Decision regarding hospitalization.   66 year old male with a history of degenerative changes of the lumbar spine with central disc protrusion with mild bilateral recess stenosis and effect upon the bilateral descending S1 nerve roots with for medial stenosis at L5-S1 and L3-S4 right extraforaminal protrusion and endplate spurring resulting in right for aminal stenosis that is mild with mass effect upon the extraforaminal right L3 nerve root per MRI imaging 12/03/2021, follows outpatient with Dr. Venetia Constable of neurosurgery who presents to the emergency department today with sudden onset worsening radicular pain and weakness in his right leg.  The patient states that he has been managing his pain outpatient with oral Dilaudid.  He was brushing his teeth this morning and had been bending over when he straighten his back and suddenly experienced sharp and shooting pain that radiated down his right leg.  He endorses numbness along his right thigh with new weakness in his right lower extremity.  He states that he cannot walk.  He denies any urinary incontinence or fecal incontinence.  He last received a steroid injection in his back 2 weeks ago.  He denies any fevers or chills.  He denies any gradual worsening of symptoms but endorses a sudden worsening of symptoms this morning upon straightening his back.  No saddle anesthesia.  Concern primarily for worsening lumbar radiculopathy.  Lower concern for acute cauda equina given the lack of bilateral nature of the patient's deficits.  Concern for possible worsening nerve root compression.  Screening laboratory work-up was performed which revealed no leukocytosis with a WBC of 9, BMP that was unremarkable.  She was administered IV fentanyl for pain control. PRN IV  Dilaudid and Ativan was ordered in order to assist the patient in lying flat for MRI imaging.  No recent falls or trauma.  On arrival, the patient was afebrile, he medically stable, hypertensive BP 162/1 7, normal sinus rhythm noted on cardiac telemetry.  The patient presents with worsening neurologic symptoms in the setting of known lumbar radiculopathy with compression of nerve roots at L5-S1 and L3-L4 previously seen on MRI imaging 2 weeks ago.  Given the patient's acute change in inability to  ambulate, new weakness in the right lower extremity, will repeat MRI imaging today.  No falls or recent trauma to suggest acute fracture.  Lower concern for epidural abscess.  Patient's MRI results revealed the following: IMPRESSION:  Mild spinal stenosis L2-3 unchanged     Shallow extraforaminal disc protrusion on the right L3-4 unchanged     Left laminectomy L5-S1. Progression of central disc protrusion L5-S1  with left S1 nerve root impingement.       On repeat evaluation, the patient states that he cannot ambulate due to pain despite the above interventions.  I did speak with on-call neurosurgery of [place] [facility], NP Meyran who recommended IV Decadron and follow-up in clinic, admission for pain control if the patient could not ambulate.  I attempted to ambulate the patient again and was unsuccessful.  The patient has received Dilaudid, Ativan, Decadron, fentanyl and still cannot ambulate due to severe radicular pain.  Hospitalist medicine consulted for admission. Informed that the patient was unassigned. Internal medicine ultimately accepted the patient in admission.  Final Clinical Impression(s) / ED Diagnoses Final diagnoses:  Lumbosacral radiculopathy  Acute right-sided low back pain with right-sided sciatica    Rx / DC Orders ED Discharge Orders     None         Regan Lemming, MD 12/21/21 1538    Regan Lemming, MD 12/21/21 2028

## 2021-12-21 NOTE — ED Triage Notes (Signed)
Ems STATED, HE WAS DX WITH A BULGING DISC 2 weeks ago and today felt a pop and has not able to move anyway without pain. Given 100 Fentanyl, Did not help the pain.

## 2021-12-21 NOTE — ED Provider Triage Note (Signed)
Emergency Medicine Provider Triage Evaluation Note  Chase Scott , a 66 y.o. male  was evaluated in triage.  Pt complains of lower back pain with radiation into the right leg ongoing over the past 6 weeks.  Patient with MRI on 12/04/2021 as below.  States that he had increasing pain after bending over this morning.  Pain shoots down into the right leg.  No bowel or bladder retention/incontinence.  Reports steroid injection in back recently with minimal improvement.  IMPRESSION: 1. Mild multilevel degenerative changes of the lumbar spine, as described above. 2. At L5-S1 there is a central disc protrusion with mild bilateral subarticular recess stenosis and mass effect upon the bilateral descending S1 nerve roots. Mild bilateral foraminal stenosis. 3. At L3-L4 there is a right extraforaminal protrusion and endplate spurring resulting in mild right foraminal stenosis with mass effect upon the extraforaminal right L3 nerve root. 4. No significant canal stenosis at any level.  Review of Systems  Positive: Back pain, weakness Negative: Fever  Physical Exam  There were no vitals taken for this visit. Gen:   Awake, appears uncomfortable Resp:  Normal effort  MSK:   Moves extremities without difficulty  Other:  Tenderness right lower L-spine  Medical Decision Making  Medically screening exam initiated at 1:40 PM.  Appropriate orders placed.  Chase Scott was informed that the remainder of the evaluation will be completed by another provider, this initial triage assessment does not replace that evaluation, and the importance of remaining in the ED until their evaluation is complete.     Carlisle Cater, PA-C 12/21/21 1341

## 2021-12-21 NOTE — Subjective & Objective (Signed)
Hx of bulging disc followed by neurosurgery Today he was brushing his teeth and when he straitened his back he felt a pop and asevere pain, has not been able to walk since This was also associated with  sudden onset worsening radicular pain and weakness in his right leg, no urinary/bowel incontinence, numbness on right thigh No fever no chills   No saddle anesthesia. He has been using dilaudid PO but now it does not control the pain Has known degenerative changes of the lumbar spine with central disc protrusion with mild bilateral recess stenosis and effect upon the bilateral descending S1 nerve roots with for medial stenosis at L5-S1 and L3-S4 right extraforaminal protrusion and endplate spurring resulting in right for aminal stenosis that is mild with mass effect upon the extraforaminal right L3 nerve root per MRI imaging 12/03/2021, follows outpatient with Dr. Venetia Constable of neurosurgery

## 2021-12-21 NOTE — ED Notes (Signed)
Patient transported to MRI 

## 2021-12-21 NOTE — H&P (Addendum)
Date: 12/21/2021               Patient Name:  Chase Scott MRN: [id number]  DOB: 12/15/1955 Age / Sex: 66 y.o., male   PCP: Baxter Hire, MD         Medical Service: Internal Medicine Teaching Service         Attending Physician: Dr. Aldine Contes, MD    First Contact: Dr. Elliot Gurney Pager: [phone]  Second Contact: Dr. Eulas Post Pager: [phone]       After Hours (After 5p/  First Contact Pager: [phone]  weekends / holidays): Second Contact Pager: [phone]   Chief Complaint: worsening back pain  History of Present Illness:  Chase Scott is a 66 year old male with past medical history of degenerative changes of lumbar spine, CAD s/p CABG 0347, diastolic heart failure, hyperlipidemia, GERD, hypertension, OSA who presents for worsening back pain.  Patient reports 1 month ago diagnosed with osteoarthritis of the spine causing back pain.  Was seen at [facility] and follows with neurosurgery, Dr. Venetia Constable, who prescribed oral Dilaudid and recommended steroid injection.  Patient reports had steroid injection about 2 weeks ago which improved his pain for few days though otherwise back pain remains persistent and affecting his ability to ambulate and assist his wife with tasks in the home.  He has been spending most of his time nonambulatory, walking with a walker at home. Patient reports this morning he had acute worsening of back pain.  He reports was brushing his teeth bending over the sink, straightened out his back and had acute sharp pain in his low back which radiated down his right leg.  Patient was able to sit on toilet and rest before making it to his bed to lie down.  Patient's pain did not improve despite rest and he called for his wife to call EMS.   Currently patient endorses weakness of right lower extremity, numbness and burning pain of right medial thigh and right knee.  He denies bowel or bladder incontinence, groin numbness.  His pain is currently adequately  controlled following IV Dilaudid 1 mg and fentanyl 50 MCG as well as Decadron 10 mg  given in the ED. at home patient has been taking hydromorphone 4 mg at bedtime, trying to extend the medication over longer duration by taking less per day since he was only given couple day prescription.  He is also taking gabapentin 300 mg 3 times daily as well as Tylenol daily.  Endorses recent constipation.  ED Course: Afebrile and hemodynamically stable on arrival with mildly elevated BPs to 425Z systolic.  Treated with pain medication as above and steroids as above.  1 mg Ativan given to help patient lie flat for MRI.  MRI showed mild spinal stenosis L2-3 unchanged, shallow extraforaminal disc protrusion of right L3-4 unchanged, left laminectomy L5-S1 with progression of central disc protrusion L5-S1 with left S1 nerve root impingement.  ED provider spoke with [place] neurosurgery NP who recommended IV steroids and follow-up in clinic with admission for pain control if patient was unable to ambulate from the ED.  Dr. Venetia Constable was able to update patient's wife.  IMTS contacted for admission due to patient not being able to ambulate secondary to pain.   Meds:  Current Meds  Medication Sig   Ascorbic Acid (VITAMIN C) 1000 MG tablet Take 1,000 mg by mouth daily.   clopidogrel (PLAVIX) 75 MG tablet Take 1 tablet (75 mg total) by mouth daily.  diclofenac (VOLTAREN) 75 MG EC tablet Take 75 mg by mouth 2 (two) times daily.   esomeprazole (NEXIUM) 20 MG capsule Take 40 mg by mouth daily as needed (acid reflux).   gabapentin (NEURONTIN) 300 MG capsule Take 300 mg by mouth 3 (three) times daily.   HYDROmorphone (DILAUDID) 4 MG tablet Take 1 tablet (4 mg total) by mouth every 4 (four) hours as needed for severe pain.   isosorbide mononitrate (IMDUR) 30 MG 24 hr tablet Take 1 tablet (30 mg total) by mouth daily. (Patient taking differently: Take 15 mg by mouth 2 (two) times daily.)   metoprolol succinate (TOPROL-XL) 100  MG 24 hr tablet Take 50-100 mg by mouth See admin instructions. Takes 100 mg in the morning and 50 mg at night   nitroGLYCERIN (NITROSTAT) 0.4 MG SL tablet Place 1 tablet (0.4 mg total) under the tongue every 5 (five) minutes as needed for chest pain.   ranolazine (RANEXA) 500 MG 12 hr tablet Take 1 tablet (500 mg total) by mouth 2 (two) times daily.   REPATHA SURECLICK 546 MG/ML SOAJ INJECT 1 PEN INTO THE MUSCLE EVERY 14 DAYS (Patient taking differently: Inject 140 mg into the vein every 14 (fourteen) days.)    Allergies: Allergies as of 12/21/2021 - Review Complete 12/21/2021  Allergen Reaction Noted   Penicillins Anaphylaxis 09/27/2011   Sulfa antibiotics Anaphylaxis 09/27/2011   Etodolac Other (See Comments) 04/26/2018   Lipitor [atorvastatin calcium]  12/14/2017   Paxil [paroxetine hcl] Other (See Comments) 04/26/2018   Statins  12/14/2017   Sulfasalazine Other (See Comments) 04/26/2018   Past Medical History:  Diagnosis Date   Alcoholism ([place])    Anginal pain ([place])    CAD (coronary artery disease)    a. 01/2003 s/p PCI to RCA;  b. 02/2003 s/ CABG x 4 (LIMA->LAD, RIMA->RCA, VG->Diag, VG->LCX; c. 2005 s/p PCI to LCX; d. 06/2012 Cath: LM nl, LAD 154m, LCX mild plaque, OM1 min irregs, OM2 patent stent, RCA 53m ISR, VG->LCX 100, VG->Diag ok, RIMA->RCA 100, LIMA->LAD ok, EF 50-55%-->Med Rx; d. 11/2017 Cath: stable anatomy, 2/4 patent grafts. LCX 30 ISR, RCA 50ost, 20 ISR-->Med Rx.   Chronic Chest pain    Diastolic dysfunction    a. 11/2017 Echo: EF 50-55%, no rwma, Gr1 DD, mildly dil LA; b. 02/2019 Echo: EF 50-55%, pseudonormalization. Mildly dil RA.   Dyslipidemia    Gastroesophageal reflux disease    Gastrointestinal symptoms    Chronic gastrointestinal symptoms   History of kidney stones    History of left shoulder fracture    Hyperlipidemia    Hypertensive cardiovascular disease    a. Labile BPs.   OSA (obstructive sleep apnea)    PVC's (premature ventricular contractions)    a.  2019 Holter: 18% PVC burden.   Rotator cuff syndrome     Family History:  Family History  Problem Relation Age of Onset   Other Mother        - "back problems and breathing problems."   Other Father        Died @ age 56 - pt doesn't know father's medical history.   Microcephaly Sister    Heart attack Sister    Social History: Patient lives in [place] with his wife.  Recently has been less ambulatory, using a walker within the home, less able to help his wife out around the home.  Former tobacco user, quit in 2004.  Past history of alcohol abuse, last drink 2011. NO recreational drug use.  Review  of Systems: A complete ROS was negative except as per HPI.   Physical Exam: Blood pressure 132/86, pulse 74, temperature 98.2 F (36.8 C), temperature source Oral, resp. rate (!) 25, SpO2 97 %. Physical Exam: General: Well appearing obese Caucasian male, NAD HENT: normocephalic, atraumatic EYES: conjunctiva non-erythematous, no scleral icterus CV: regular rate, normal rhythm, no murmurs, rubs, gallops. Pulmonary: normal work of breathing on RA, lungs clear to auscultation, no rales, wheezes, rhonchi Abdominal: non-distended, soft, non-tender to palpation, normal BS Skin: Warm and dry, no rashes or lesions Neurological: MS: awake, alert and oriented x3, normal speech and fund of knowledge MSK: Right hip flexors 4/5, right knee flexion extension 5/5, right ankle plantar and dorsiflexion 5/5.  Left lower extremity strength 5/5. Psych: normal affect  CBC    Component Value Date/Time   WBC 9.0 12/21/2021 1356   RBC 4.77 12/21/2021 1356   HGB 15.6 12/21/2021 1356   HCT 44.4 12/21/2021 1356   PLT 290 12/21/2021 1356   MCV 93.1 12/21/2021 1356   MCH 32.7 12/21/2021 1356   MCHC 35.1 12/21/2021 1356   RDW 11.9 12/21/2021 1356   LYMPHSABS 2.7 12/21/2021 1356   MONOABS 0.7 12/21/2021 1356   EOSABS 0.1 12/21/2021 1356   BASOSABS 0.1 12/21/2021 1356   CMP Latest Ref Rng & Units  12/21/2021 01/05/2019 01/03/2019  Glucose 70 - 99 mg/dL 107(H) 101(H) 145(H)  BUN 8 - 23 mg/dL 15 15 14   Creatinine 0.61 - 1.24 mg/dL 1.18 1.07 1.05  Sodium 135 - 145 mmol/L 137 137 136  Potassium 3.5 - 5.1 mmol/L 4.3 3.9 3.5  Chloride 98 - 111 mmol/L 105 103 103  CO2 22 - 32 mmol/L 24 29 27   Calcium 8.9 - 10.3 mg/dL 9.1 8.7(L) 8.8(L)  Total Protein 6.5 - 8.1 g/dL - - 6.9  Total Bilirubin 0.3 - 1.2 mg/dL - - 0.8  Alkaline Phos 38 - 126 U/L - - 47  AST 15 - 41 U/L - - 22  ALT 0 - 44 U/L - - 19   MR LUMBAR SPINE WO CONTRAST  Result Date: 12/21/2021 CLINICAL DATA:  Low back pain.  Cauda equina syndrome suspected. EXAM: MRI LUMBAR SPINE WITHOUT CONTRAST TECHNIQUE: Multiplanar, multisequence MR imaging of the lumbar spine was performed. No intravenous contrast was administered. COMPARISON:  MRI lumbar spine 12/03/2021 FINDINGS: Segmentation:  5 lumbar vertebra. Alignment:  Mild retrolisthesis L5-S1.  Otherwise normal alignment Vertebrae: Negative for fracture or mass. No evidence of spinal infection. Conus medullaris and cauda equina: Conus extends to the L1 level. Conus and cauda equina appear normal. Paraspinal and other soft tissues: Negative for paraspinous mass adenopathy or fluid collection. Disc levels: Motion degraded study particularly the axial degrading evaluation of neural impingement. L1-2: Negative L2-3: Moderate disc degeneration with disc space narrowing. Central disc protrusion and spurring unchanged. Mild spinal stenosis L3-4: Shallow extraforaminal disc protrusion on the right unchanged with displacement of the right L3 nerve root lateral the foramen. Mild right foraminal narrowing. Spinal canal adequate in size. Mild facet degeneration L4-5: Mild disc bulging and mild facet degeneration. Negative for stenosis L5-S1: Left laminectomy. Central disc protrusion. Progressive disc protrusion on the left with left S1 nerve root impingement. Moderate facet degeneration bilaterally. Mild flattening  of the right S1 nerve root due to subarticular stenosis. IMPRESSION: Mild spinal stenosis L2-3 unchanged Shallow extraforaminal disc protrusion on the right L3-4 unchanged Left laminectomy L5-S1. Progression of central disc protrusion L5-S1 with left S1 nerve root impingement. Electronically Signed   By:  Franchot Gallo M.D.   On: 12/21/2021 16:54     Assessment & Plan by Problem: Principal Problem:   Severe back pain Active Problems:   Hypertension   Hypercholesterolemia   GERD (gastroesophageal reflux disease)   CAD (coronary artery disease)   OSA (obstructive sleep apnea)  Chase Scott is a 66 year old male with past medical history of degenerative changes of lumbar spine with known disc protrusion at L3-L4 and L5-S1, CAD s/p CABG 0947, diastolic heart failure, hyperlipidemia, GERD, hypertension, OSA who presents for acute worsening back pain and found to have progression of central disc protrusion at L5-S1 on MRI.  Patient unable to ambulate secondary to pain and admitted for pain control.  #Lumbar radiculopathy 2/2 disc protrusion L5-S1, L3-4 #Osteoarthritis of lumbar spine Patient has had acute worsening of lumbar back pain with radiculopathy over the last 24 hours.  MRI shows progression of central disc protrusion at L5-S1 with stable mild spinal stenosis L2-3, shallow extraforaminal disc protrusion right L3-4.  No saddle anesthesia, bowel bladder incontinence. Progression of disc protrusion likely the cause of patient's worsening lumbar radiculopathy. Neurosurgery recommended IV Decadron and outpatient follow-up.  Patient was unable to ambulate secondary to pain and admitted for additional pain control.  Patient currently adequately controlled on 1 mg IV Dilaudid and 50 MCG fentanyl given in ED.  Plan: -Admit observation for pain control -S/P Decadron 10 mg -Continue scheduled Norco 5-325 mg every 4 hours -Norco 5-325 mg every 4 hours as needed for breakthrough pain -IV Dilaudid  0.5-1 mg every 2 hours as needed for severe pain -Continue gabapentin 300 mg 3 times daily  #HTN Continued home metoprolol succinate 24-hour 100 mg a.m. and 50 mg p.m. and Imdur 30 mg daily. Currently normotensive. Plan: -Metoprolol Succinate 24-hour 100 mg a.m.  -Metoprolol Succinate 50 mg p.m.  -Imdur 30 mg daily  #CAD s/p CABG 2004 #Coronary stents left circumflex and RCA #History of asymptomatic PVCs #HFpEF EF 50% with G1DD #HLD Follows with [facility], last OV 05/26/21.  Cardiologist is Dr. Jacqulyn Cane.  Last echo in 2020.  Last cardiac cath January 2019.  Patient receiving Repatha for hyperlipidemia, since previously intolerant to statins.  Euvolemic on exam today.  No shortness of breath or chest pain. Plan: -Continue aspirin 81 mg daily -Continue Plavix 75 mg daily -Continue antihypertensive regimen as above -Continue Ranexa  #OSA History of OSA on CPAP at home. Plan: -Nightly CPAP  #Prediabetes Last hemoglobin A1c September 2022 at 5.9%.  Blood sugars appropriate in the ED. Plan: -Repeat hemoglobin A1c  Diet: Regular  VTE: Xarelto 10mg  IVF: None Code: Full  Dispo: Admit patient to Observation with expected length of stay less than 2 midnights.  Signed: Wayland Denis, MD 12/21/21,  9:56 PM Pager: [phone] Internal Medicine Resident, PGY-1 [facility]

## 2021-12-22 DIAGNOSIS — M549 Dorsalgia, unspecified: Secondary | ICD-10-CM

## 2021-12-22 MED ORDER — GABAPENTIN 300 MG PO CAPS
300.0000 mg | ORAL_CAPSULE | Freq: Three times a day (TID) | ORAL | Status: DC
  Administered 2021-12-22 (×2): 300 mg via ORAL
  Filled 2021-12-22 (×2): qty 1

## 2021-12-22 MED ORDER — METHYLPREDNISOLONE 4 MG PO TBPK: ORAL_TABLET | ORAL | 0 refills | Status: DC

## 2021-12-22 MED ORDER — HYDROCODONE-ACETAMINOPHEN 5-325 MG PO TABS: 1.0000 | ORAL_TABLET | ORAL | 0 refills | Status: DC | PRN

## 2021-12-22 NOTE — TOC Progression Note (Signed)
Transition of Care [facility]) - Progression Note    Patient Details  Name: Chase Scott MRN: [id number] Date of Birth: Jun 30, 1956  Transition of Care [facility]) CM/SW [place], RN Phone [phone]  12/22/2021, 10:31 AM  Clinical Narrative:     Transition of Care [facility]) Screening Note   Patient Details  Name: Chase Scott Date of Birth: Dec 26, 1955   Transition of Care [facility]) CM/SW Contact:    Angelita Ingles, RN Phone Number: 12/22/2021, 10:31 AM    Transition of Care Department [facility]) has reviewed patient and no TOC needs have been identified at this time. We will continue to monitor patient advancement through interdisciplinary progression rounds. If new patient transition needs arise, please place a TOC consult.          Expected Discharge Plan and Services                                                 Social Determinants of Health (SDOH) Interventions    Readmission Risk Interventions No flowsheet data found.

## 2021-12-22 NOTE — Progress Notes (Signed)
Pt with orders to d/c home. Assessment complete pt stable. IV removed. Discharge education and packet provided to pt all questions answered. Prescriptions sent to pharmacy pt aware. Pt and all belongings transported via wheelchair to private vehicle without incident.

## 2021-12-22 NOTE — Discharge Summary (Addendum)
Name: Chase Scott MRN: [id number] DOB: [birth year] 66 y.o. PCP: Baxter Hire, MD  Date of Admission: 12/21/2021 11:25 AM Date of Discharge: 12/22/21 Attending Physician: Dr. Dareen Piano  Discharge Diagnosis: Principal Problem:   Severe back pain Active Problems:   Hypertension   Hypercholesterolemia   GERD (gastroesophageal reflux disease)   CAD (coronary artery disease)   OSA (obstructive sleep apnea)    Discharge Medications: Allergies as of 12/22/2021       Reactions   Penicillins Anaphylaxis   Sulfa Antibiotics Anaphylaxis   Etodolac Other (See Comments)   unknown   Lipitor [atorvastatin Calcium]    Myalgias @ 80 mg   Paxil [paroxetine Hcl] Other (See Comments)   unknown   Statins    Myalgias on high dose lipitor and moderate dose crestor.   Sulfasalazine Other (See Comments)   unknown        Medication List     STOP taking these medications    Methocarbamol 1000 MG Tabs       TAKE these medications    aspirin 81 MG EC tablet Take 1 tablet (81 mg total) by mouth daily.   clopidogrel 75 MG tablet Commonly known as: PLAVIX Take 1 tablet (75 mg total) by mouth daily.   diclofenac 75 MG EC tablet Commonly known as: VOLTAREN Take 75 mg by mouth 2 (two) times daily.   esomeprazole 20 MG capsule Commonly known as: NEXIUM Take 40 mg by mouth daily as needed (acid reflux).   gabapentin 300 MG capsule Commonly known as: NEURONTIN Take 300 mg by mouth 3 (three) times daily.   HYDROcodone-acetaminophen 5-325 MG tablet Commonly known as: NORCO/VICODIN Take 1 tablet by mouth every 4 (four) hours as needed for up to 7 days for moderate pain.   HYDROmorphone 4 MG tablet Commonly known as: Dilaudid Take 1 tablet (4 mg total) by mouth every 4 (four) hours as needed for severe pain.   isosorbide mononitrate 30 MG 24 hr tablet Commonly known as: IMDUR Take 1 tablet (30 mg total) by mouth daily. What changed:  how much to take when to take this    methylPREDNISolone 4 MG Tbpk tablet Commonly known as: MEDROL DOSEPAK Day 1: 24 mg on day 1 administered as 8 mg (2 tablets) before breakfast, 4 mg (1 tablet) after lunch, 4 mg (1 tablet) after supper, and 8 mg (2 tablets) at bedtime or 24 mg (6 tablets) as a single dose or divided into 2 or 3 doses upon initiation (regardless of time of day).   Day 2: 20 mg on day 2 administered as 4 mg (1 tablet) before breakfast, 4 mg (1 tablet) after lunch, 4 mg (1 tablet) after supper, and 8 mg (2 tablets) at bedtime.   Day 3: 16 mg on day 3 administered as 4 mg (1 tablet) before breakfast, 4 mg (1 tablet) after lunch, 4 mg (1 tablet) after supper, and 4 mg (1 tablet) at bedtime.   Day 4: 12 mg on day 4 administered as 4 mg (1 tablet) before breakfast, 4 mg (1 tablet) after lunch, and 4 mg (1 tablet) at bedtime.   Day 5: 8 mg on day 5 administered as 4 mg (1 tablet) before breakfast and 4 mg (1 tablet) at bedtime.    Day 6: 4 mg on day 6 administered as 4 mg (1 tablet) before breakfast.   metoprolol succinate 100 MG 24 hr tablet Commonly known as: TOPROL-XL Take 50-100 mg by mouth See admin instructions.  Takes 100 mg in the morning and 50 mg at night   nitroGLYCERIN 0.4 MG SL tablet Commonly known as: NITROSTAT Place 1 tablet (0.4 mg total) under the tongue every 5 (five) minutes as needed for chest pain.   ranolazine 500 MG 12 hr tablet Commonly known as: RANEXA Take 1 tablet (500 mg total) by mouth 2 (two) times daily.   Repatha SureClick 357 MG/ML Soaj Generic drug: Evolocumab INJECT 1 PEN INTO THE MUSCLE EVERY 14 DAYS What changed: See the new instructions.   vitamin C 1000 MG tablet Take 1,000 mg by mouth daily.        Disposition and follow-up:   Mr.Chase Scott was discharged from [facility] in Stable condition.  At the hospital follow up visit please address:  1.  Follow-up:  a.Lumbar radiculopathy with acute worsening    b. HTN  2.  Labs / imaging  needed at time of follow-up: none  3.  Pending labs/ test needing follow-up: none  4.  Medication Changes  Started: Medrol Dose pack  Stopped: methocarbamol  Changed: none  Follow-up Appointments:  Follow-up Information     Judith Part, MD Follow up in 2 week(s).   Specialty: Neurosurgery Contact information: [address] [place] [address] [phone]               Please follow up with Dr. Zada Finders in 2 weeks Please follow up with PCP in 1 week  Hospital Course by problem list:  # Lumbar radiculopathy 2/2 disc protrusion L5-S1, L3-4 - patient presented with acute worsening of lumbar back pain. MRI was obtained and showed progression of central disc protrusion at L5-S1 with stable spinal stenosis. Patient was initially unable to ambulate secondary to pain and was admitted as observation status for pain control. Case was discussed with neurosurgery who recommended IV decadron and outpatient follow up. After receiving pain medication and steroids, patient reported improvement, was able to ambulate to restroom with his walker, and felt okay to go home. PT evaluated the patient and recommended outpatient PT.  He was discharged in stable condition with a short course of pain medication, medrol dose pack, and instructions to follow up with Dr. Zada Finders in 2 weeks.    HTN - patient was continued on home metoprolol and Imdur. He remained hemodynamically stable throughout hospitalization.   Discharge Subjective: Patient states he feels improved today. He has been able to ambulate with his walker to the restroom. Leg still giving out on him occasionally due to pain, however, this is not new. He says he feels okay to go home.   Discharge Exam:   BP (!) 176/90 (BP Location: Right Arm)    Pulse 90    Temp 97.8 F (36.6 C) (Oral)    Resp 14    SpO2 99%  Constitutional: well-appearing male sitting in bed, in no acute distress HENT: normocephalic atraumatic, mucous  membranes moist Eyes: conjunctiva non-erythematous Neck: supple Cardiovascular: regular rate and rhythm, no m/r/g Pulmonary/Chest: normal work of breathing on room air, lungs clear to auscultation bilaterally Abdominal: soft, non-tender, non-distended MSK: normal bulk and tone Neurological: alert & oriented x 3, 4/5 strength RLE limited by pain, 5/5 strength LLE. Moves all extremities spontaneously. Sensation preserved.  Skin: warm and dry Psych: mood and affect appropriate.   Pertinent Labs, Studies, and Procedures:  CBC Latest Ref Rng & Units 12/21/2021 01/05/2019 01/03/2019  WBC 4.0 - 10.5 K/uL 9.0 7.1 9.3  Hemoglobin 13.0 - 17.0  g/dL 15.6 15.2 15.1  Hematocrit 39.0 - 52.0 % 44.4 44.4 43.1  Platelets 150 - 400 K/uL 290 213 243    CMP Latest Ref Rng & Units 12/21/2021 01/05/2019 01/03/2019  Glucose 70 - 99 mg/dL 107(H) 101(H) 145(H)  BUN 8 - 23 mg/dL 15 15 14   Creatinine 0.61 - 1.24 mg/dL 1.18 1.07 1.05  Sodium 135 - 145 mmol/L 137 137 136  Potassium 3.5 - 5.1 mmol/L 4.3 3.9 3.5  Chloride 98 - 111 mmol/L 105 103 103  CO2 22 - 32 mmol/L 24 29 27   Calcium 8.9 - 10.3 mg/dL 9.1 8.7(L) 8.8(L)  Total Protein 6.5 - 8.1 g/dL - - 6.9  Total Bilirubin 0.3 - 1.2 mg/dL - - 0.8  Alkaline Phos 38 - 126 U/L - - 47  AST 15 - 41 U/L - - 22  ALT 0 - 44 U/L - - 19    MR LUMBAR SPINE WO CONTRAST  Result Date: 12/21/2021 CLINICAL DATA:  Low back pain.  Cauda equina syndrome suspected. EXAM: MRI LUMBAR SPINE WITHOUT CONTRAST TECHNIQUE: Multiplanar, multisequence MR imaging of the lumbar spine was performed. No intravenous contrast was administered. COMPARISON:  MRI lumbar spine 12/03/2021 FINDINGS: Segmentation:  5 lumbar vertebra. Alignment:  Mild retrolisthesis L5-S1.  Otherwise normal alignment Vertebrae: Negative for fracture or mass. No evidence of spinal infection. Conus medullaris and cauda equina: Conus extends to the L1 level. Conus and cauda equina appear normal. Paraspinal and other soft tissues:  Negative for paraspinous mass adenopathy or fluid collection. Disc levels: Motion degraded study particularly the axial degrading evaluation of neural impingement. L1-2: Negative L2-3: Moderate disc degeneration with disc space narrowing. Central disc protrusion and spurring unchanged. Mild spinal stenosis L3-4: Shallow extraforaminal disc protrusion on the right unchanged with displacement of the right L3 nerve root lateral the foramen. Mild right foraminal narrowing. Spinal canal adequate in size. Mild facet degeneration L4-5: Mild disc bulging and mild facet degeneration. Negative for stenosis L5-S1: Left laminectomy. Central disc protrusion. Progressive disc protrusion on the left with left S1 nerve root impingement. Moderate facet degeneration bilaterally. Mild flattening of the right S1 nerve root due to subarticular stenosis. IMPRESSION: Mild spinal stenosis L2-3 unchanged Shallow extraforaminal disc protrusion on the right L3-4 unchanged Left laminectomy L5-S1. Progression of central disc protrusion L5-S1 with left S1 nerve root impingement. Electronically Signed   By: Franchot Gallo M.D.   On: 12/21/2021 16:54     Discharge Instructions: Discharge Instructions     Ambulatory referral to Physical Therapy   Complete by: As directed    Call MD for:   Complete by: As directed    Weakness in your legs, numbness or not being able to feel in your groin, not being able to hold your urine or stool.   Call MD for:  severe uncontrolled pain   Complete by: As directed    Call MD for:  temperature >100.4   Complete by: As directed    Diet - low sodium heart healthy   Complete by: As directed      Please schedule an appointment with Dr. Joaquim Nam for 2 weeks.  Please continue to use physical therapy.   Signed: Delene Ruffini, MD 12/22/2021, 2:38 PM   Pager: [phone]

## 2021-12-22 NOTE — Evaluation (Signed)
Occupational Therapy Evaluation Patient Details Name: Chase Scott MRN: [id number] DOB: 1956-01-19 Today's Date: 12/22/2021   History of Present Illness 66 year old male with past medical history of degenerative changes of lumbar spine, CAD s/p CABG 8841, diastolic heart failure, hyperlipidemia, GERD, hypertension, OSA who presents for worsening back pain.   Clinical Impression   Patient admitted for the diagnosis above.  PTA he lives with his spouse, who is able to assist as needed.  Up until a few days ago, he was independent with all mobility and ADL/IADL.  Pain is the primary deficit.  Currently he is needing generalized supervision and cues for ADL completion and in room mobility at RW level.  OT will follow in the acute setting depending on work and plan, but no post acute OT is anticipated.        Recommendations for follow up therapy are one component of a multi-disciplinary discharge planning process, led by the attending physician.  Recommendations may be updated based on patient status, additional functional criteria and insurance authorization.   Follow Up Recommendations  No OT follow up    Assistance Recommended at Discharge Intermittent Supervision/Assistance  Patient can return home with the following      Functional Status Assessment  Patient has had a recent decline in their functional status and demonstrates the ability to make significant improvements in function in a reasonable and predictable amount of time.  Equipment Recommendations  None recommended by OT;Other (comment) (patient deferred BSC, OT recommended shower seat)    Recommendations for Other Services       Precautions / Restrictions Precautions Precautions: Back;Fall Precaution Booklet Issued: Yes (comment) Precaution Comments: reviewed Restrictions Weight Bearing Restrictions: No      Mobility Bed Mobility Overal bed mobility: Needs Assistance Bed Mobility: Sidelying to Sit, Sit to  Sidelying   Sidelying to sit: Supervision     Sit to sidelying: Supervision      Transfers Overall transfer level: Needs assistance Equipment used: Rolling walker (2 wheels) Transfers: Sit to/from Stand, Bed to chair/wheelchair/BSC Sit to Stand: Supervision     Step pivot transfers: Supervision            Balance Overall balance assessment: Needs assistance Sitting-balance support: Bilateral upper extremity supported, Feet supported Sitting balance-Leahy Scale: Good     Standing balance support: Reliant on assistive device for balance Standing balance-Leahy Scale: Fair                             ADL either performed or assessed with clinical judgement   ADL                                         General ADL Comments: generalized supervision and cues for all ADL tasks     Vision Patient Visual Report: No change from baseline       Perception Perception Perception: Not tested   Praxis Praxis Praxis: Not tested    Pertinent Vitals/Pain Pain Assessment Pain Assessment: Faces Faces Pain Scale: Hurts even more Pain Location: Low back and legs Pain Descriptors / Indicators: Sharp, Shooting, Grimacing, Guarding Pain Intervention(s): Monitored during session, Premedicated before session     Hand Dominance Right   Extremity/Trunk Assessment Upper Extremity Assessment Upper Extremity Assessment: Overall WFL for tasks assessed   Lower Extremity Assessment Lower Extremity Assessment: Defer to PT evaluation  Cervical / Trunk Assessment Cervical / Trunk Assessment: Kyphotic   Communication Communication Communication: No difficulties   Cognition Arousal/Alertness: Awake/alert Behavior During Therapy: WFL for tasks assessed/performed Overall Cognitive Status: Within Functional Limits for tasks assessed                                       General Comments   VSS on RA    Exercises     Shoulder  Instructions      Home Living Family/patient expects to be discharged to:: Private residence Living Arrangements: Spouse/significant other Available Help at Discharge: Family;Available 24 hours/day Type of Home: House Home Access: Stairs to enter [employer] of Steps: 2 Entrance Stairs-Rails: None Home Layout: Two level;Able to live on main level with bedroom/bathroom     Bathroom Shower/Tub: Walk-in shower;Tub/shower unit   Bathroom Toilet: Standard     Home Equipment: Conservation officer, nature (2 wheels)          Prior Functioning/Environment Prior Level of Function : Independent/Modified Independent;Driving             Mobility Comments: Up until 2 weeks ago, no AD needed for mobility.  Using RW for short household distances ADLs Comments: Unable to tolerate showers due to pain, spouse can assist as needed.        OT Problem List: Decreased activity tolerance;Pain;Impaired balance (sitting and/or standing)      OT Treatment/Interventions: Self-care/ADL training;Balance training;Therapeutic activities;DME and/or AE instruction;Patient/family education    OT Goals(Current goals can be found in the care plan section) Acute Rehab OT Goals Patient Stated Goal: Reduce the pain OT Goal Formulation: With patient Time For Goal Achievement: 01/05/22 Potential to Achieve Goals: Good ADL Goals Pt Will Perform Grooming: with modified independence;standing Pt Will Perform Upper Body Dressing: with modified independence Pt Will Perform Lower Body Dressing: with modified independence;sit to/from stand Pt Will Transfer to Toilet: with modified independence;ambulating;regular height toilet  OT Frequency: Min 2X/week    Co-evaluation              AM-PAC OT "6 Clicks" Daily Activity     Outcome Measure Help from another person eating meals?: None Help from another person taking care of personal grooming?: None Help from another person toileting, which includes using  toliet, bedpan, or urinal?: A Little Help from another person bathing (including washing, rinsing, drying)?: A Little Help from another person to put on and taking off regular upper body clothing?: None Help from another person to put on and taking off regular lower body clothing?: A Little 6 Click Score: 21   End of Session Equipment Utilized During Treatment: Gait belt Nurse Communication: Mobility status  Activity Tolerance: Patient tolerated treatment well Patient left: in bed;with call bell/phone within reach  OT Visit Diagnosis: Unsteadiness on feet (R26.81)                Time: 1660-6301 OT Time Calculation (min): 30 min Charges:  OT General Charges $OT Visit: 1 Visit OT Evaluation $OT Eval Moderate Complexity: 1 Mod OT Treatments $Self Care/Home Management : 8-22 mins  12/22/2021  RP, OTR/L  Acute Rehabilitation Services  Office:  [phone]   Chase Scott 12/22/2021, 9:02 AM

## 2021-12-22 NOTE — Discharge Instructions (Addendum)
Please schedule an appointment with Dr. Joaquim Nam for 2 weeks.  Please continue to use physical therapy.

## 2021-12-22 NOTE — TOC Progression Note (Signed)
Transition of Care [facility]) - Progression Note    Patient Details  Name: Chase Scott MRN: [id number] Date of Birth: May 29, 1956  [facility] CM/SW [place], RN Phone Number:[phone]  12/22/2021, 2:20 PM  Clinical Narrative:    [facility] consulted for patient discharging with outpatient therapy recommendations. CM at bedside to discuss outpatient therapy with patient. Per patient he does not want to commit to outpatient therapy until he discusses with his MD. Patient states that he is in excruciating pain when he walks and he just doesn't think that he can do outpatient therapy right now. No other needs noted at this time. TOC will sign off.         Expected Discharge Plan and Services           Expected Discharge Date: 12/22/21                                     Social Determinants of Health (SDOH) Interventions    Readmission Risk Interventions No flowsheet data found.

## 2021-12-22 NOTE — Evaluation (Signed)
Physical Therapy Evaluation Patient Details Name: Chase Scott MRN: [id number] DOB: 10-07-1956 Today's Date: 12/22/2021  History of Present Illness  66 y.o. male presents to [facility] hospital on 12/21/2021 with worsening back pain. MRI showed left laminectomy L5-S1 with progression of central disc protrusion L5-S1 with left S1 nerve root impingement. PMH inlcudes CAD s/p CABG 4034, diastolic heart failure, hyperlipidemia, GERD, hypertension, OSA.  Clinical Impression  Pt presents to PT with significant low back pain radiating into RLE. Pt reports standing and ambulating as well as laying flat on his back all increase the pt. Pt finds increased comfort in L sidelying with knee/hip flexion. PT notes lack of lordotic curve in the lumbar spine and provides education on pelvic tilts and use of lumbar roll. Pt will benefit from further outpatient PT to progress exercise and activity tolerance.     Recommendations for follow up therapy are one component of a multi-disciplinary discharge planning process, led by the attending physician.  Recommendations may be updated based on patient status, additional functional criteria and insurance authorization.  Follow Up Recommendations Outpatient PT (pt may decline, reports PT has not been effective in the past)    Assistance Recommended at Discharge Intermittent Supervision/Assistance  Patient can return home with the following  A little help with bathing/dressing/bathroom;Assistance with cooking/housework;Assist for transportation;Help with stairs or ramp for entrance    Equipment Recommendations None recommended by PT  Recommendations for Other Services       Functional Status Assessment Patient has had a recent decline in their functional status and demonstrates the ability to make significant improvements in function in a reasonable and predictable amount of time.     Precautions / Restrictions Precautions Precautions: Back;Fall Precaution Booklet Issued:  Yes (comment) Precaution Comments: reviewed Required Braces or Orthoses:  (pt reports owning a back brace which has provided relief in the past) Restrictions Weight Bearing Restrictions: No      Mobility  Bed Mobility                    Transfers Overall transfer level: Needs assistance Equipment used: Rolling walker (2 wheels) Transfers: Sit to/from Stand, Bed to chair/wheelchair/BSC Sit to Stand: Supervision   Step pivot transfers: Supervision            Ambulation/Gait Ambulation/Gait assistance: Supervision Gait Distance (Feet): 40 Feet Assistive device: Rolling walker (2 wheels) Gait Pattern/deviations: Step-through pattern Gait velocity: reduced Gait velocity interpretation: <1.8 ft/sec, indicate of risk for recurrent falls   General Gait Details: pt with slowed step-through gait, increased trunk flexion over RW, pt reports erect posture increases back pain. Pt noted with reduced lumbar lordosis.  Stairs            Wheelchair Mobility    Modified Rankin (Stroke Patients Only)       Balance Overall balance assessment: Needs assistance Sitting-balance support: No upper extremity supported, Feet supported Sitting balance-Leahy Scale: Good     Standing balance support: During functional activity, Single extremity supported, Bilateral upper extremity supported, Reliant on assistive device for balance Standing balance-Leahy Scale: Poor                               Pertinent Vitals/Pain Pain Assessment Pain Assessment: 0-10 Pain Score: 3  Pain Location: low back/ R buttocks Pain Descriptors / Indicators: Sharp, Shooting, Grimacing, Guarding Pain Intervention(s): Monitored during session    Home Living Family/patient expects to be discharged to:: Private  residence Living Arrangements: Spouse/significant other Available Help at Discharge: Family;Available 24 hours/day Type of Home: House Home Access: Stairs to enter Entrance  Stairs-Rails: None Entrance Stairs-Number of Steps: 2   Home Layout: Two level;Able to live on main level with bedroom/bathroom Home Equipment: Rolling Walker (2 wheels)      Prior Function Prior Level of Function : Independent/Modified Independent;Driving             Mobility Comments: Up until 2 weeks ago, no AD needed for mobility.  Using RW for short household distances ADLs Comments: Unable to tolerate showers due to pain, spouse can assist as needed.     Hand Dominance   Dominant Hand: Right    Extremity/Trunk Assessment   Upper Extremity Assessment Upper Extremity Assessment: Overall WFL for tasks assessed    Lower Extremity Assessment Lower Extremity Assessment: Overall WFL for tasks assessed    Cervical / Trunk Assessment Cervical / Trunk Assessment: Kyphotic  Communication   Communication: No difficulties  Cognition Arousal/Alertness: Awake/alert Behavior During Therapy: WFL for tasks assessed/performed Overall Cognitive Status: Within Functional Limits for tasks assessed                                          General Comments General comments (skin integrity, edema, etc.): VSS on RA    Exercises Other Exercises Other Exercises: repeated spinal extension in standing x 5 reps, pt reports worsening of symptoms Other Exercises: repeated spinal flexion in sitting, pt reports no change in symptoms Other Exercises: PT provides education on anterior pelvic tilt exercise in sitting and supine as well as use of lumbar roll to aide in improving lordotic curve in lumbar spine   Assessment/Plan    PT Assessment Patient needs continued PT services  PT Problem List Decreased activity tolerance;Decreased balance;Decreased mobility;Decreased knowledge of precautions;Pain       PT Treatment Interventions DME instruction;Gait training;Stair training;Functional mobility training;Therapeutic activities;Therapeutic exercise;Balance  training;Neuromuscular re-education;Patient/family education    PT Goals (Current goals can be found in the Care Plan section)  Acute Rehab PT Goals Patient Stated Goal: to reduce back pain and return to daily activities PT Goal Formulation: With patient Time For Goal Achievement: 01/05/22 Potential to Achieve Goals: Good    Frequency Min 3X/week     Co-evaluation               AM-PAC PT "6 Clicks" Mobility  Outcome Measure Help needed turning from your back to your side while in a flat bed without using bedrails?: A Little Help needed moving from lying on your back to sitting on the side of a flat bed without using bedrails?: A Little Help needed moving to and from a bed to a chair (including a wheelchair)?: A Little Help needed standing up from a chair using your arms (e.g., wheelchair or bedside chair)?: A Little Help needed to walk in hospital room?: A Little Help needed climbing 3-5 steps with a railing? : A Little 6 Click Score: 18    End of Session   Activity Tolerance: Patient limited by pain Patient left: in chair;with call bell/phone within reach Nurse Communication: Mobility status PT Visit Diagnosis: Pain;Other abnormalities of gait and mobility (R26.89) Pain - Right/Left: Right Pain - part of body: Hip    Time: 4782-9562 PT Time Calculation (min) (ACUTE ONLY): 33 min   Charges:   PT Evaluation $PT Eval Low Complexity:  1 Low PT Treatments $Therapeutic Exercise: 8-22 mins        Zenaida Niece, PT, DPT Acute Rehabilitation Pager: [phone] Office [phone]   Zenaida Niece 12/22/2021, 10:31 AM

## 2021-12-23 LAB — HEMOGLOBIN A1C
Hgb A1c MFr Bld: 6.1 % — ABNORMAL HIGH (ref 4.8–5.6)
Mean Plasma Glucose: 128 mg/dL

## 2022-01-13 ENCOUNTER — Telehealth: Payer: Self-pay | Admitting: Medical

## 2022-01-13 NOTE — Telephone Encounter (Signed)
Hold Plavix 7 days before. 

## 2022-01-13 NOTE — Telephone Encounter (Signed)
° °  Pre-operative Risk Assessment    Patient Name: Chase Scott  DOB: Apr 05, 1956 MRN: [id number]      Request for Surgical Clearance    Procedure:   Right L2-3, Right L3-4 far lateral minimally invasive microdiscectomy   Date of Surgery:  Clearance 02/06/22                                 Surgeon:  Zada Finders Surgeon's Group or Practice Name:  [facility]  Phone number:  [phone] Fax number:  [phone]   Type of Clearance Requested:   - Medical  - Pharmacy:  Hold Clopidogrel (Plavix) please advise   Type of Anesthesia:  General    Additional requests/questions:    Chase Scott   01/13/2022, 1:22 PM

## 2022-01-13 NOTE — Telephone Encounter (Signed)
° °  Patient Name: Chase Scott  DOB: [birth year] MRN: [id number]  Primary Cardiologist: [name], MD  Chart reviewed as part of pre-operative protocol coverage.  Is a 66-year-old male with a history of CAD s/p CABG in 2004 and subsequent PCI-LCx and RCA, chronic diastolic heart failure, hypertension and hyperlipidemia.  Most recent cardiac catheterization in January 2019 showed 3V disease with patents stents, patent LIMA to LAD and SVG to diag, known CTO SVG to LCX and atretic RIMA to RCA, medical management recommended. Echo at the time showed EF 50 to 55%, weight 1 DD, moderate concentric hypertrophy, mildly dilated left atrium.  He was last seen in the office by Cadence Furth, PA on 05/26/2021 and was stable from a cardiac standpoint.1 year follow-up was recommended.  Surgical clearance request received for upcoming R L2-3, R L3-4 far lateral minimally invasive microdiscectomy scheduled for 02/06/2022.  Dr. Fletcher Anon, please advised on holding Plavix prior to surgery.  Thank you.    Lenna Sciara, NP 01/13/2022, 1:29 PM

## 2022-01-13 NOTE — Telephone Encounter (Signed)
° °  Name: Chase Scott  DOB: 10-08-1956  MRN: [id number]   Primary Cardiologist: [name], MD  Chart reviewed as part of pre-operative protocol coverage. Patient was contacted 01/13/2022 in reference to pre-operative risk assessment for pending surgery as outlined below.  Chase Scott was last seen on 05/26/2021 by Tarri Glenn, PA.  Since that day, Chase Scott has done from a cardiac standpoint. His Activity is somewhat limited in the setting of back pain, despite this, his METs are 5.62 per the [facility] Activity Status Index. His RCRI perioperative risk of major cardiac event is 6.6%.  Therefore, based on ACC/AHA guidelines, the patient would be at acceptable risk for the planned procedure without further cardiovascular testing.   The patient was advised that if he develops new symptoms prior to surgery to contact our office to arrange for a follow-up visit, and he verbalized understanding.  Per Dr. Tyrell Antonio review and recommendation, patient may hold Plavix for 7 days prior to surgery. Please resume Plavix as soon as possible in the postoperative period, at the discretion of the surgeon.  I will route this recommendation to the requesting party via Epic fax function and remove from pre-op pool. Please call with questions.  Lenna Sciara, NP 01/13/2022, 4:57 PM

## 2022-02-17 ENCOUNTER — Other Ambulatory Visit: Payer: Self-pay | Admitting: Medical

## 2022-03-28 ENCOUNTER — Telehealth: Payer: Self-pay

## 2022-03-28 NOTE — Telephone Encounter (Signed)
Incoming fax from [facility] ? ?PA for repatha is approved.  ? ?Approval dates -  03/06/2022 through 03/07/2023 ?

## 2022-03-28 NOTE — Telephone Encounter (Signed)
Coralyn Helling (Key: [id number]) ? ?Your information has been submitted to [employer]. [employer] will review the request and notify you of the determination decision directly, typically within 3 business days of your submission and once all necessary information is received. ? ?You will also receive your request decision electronically. To check for an update later, open the request again from your dashboard. ? ?If [employer] has not responded within the specified timeframe or if you have any questions about your PA submission, contact [facility] directly at [facility]) [phone] or ([place]) [phone]. ?

## 2022-05-18 ENCOUNTER — Other Ambulatory Visit: Payer: Self-pay | Admitting: Medical

## 2022-07-18 ENCOUNTER — Other Ambulatory Visit: Payer: Self-pay | Admitting: *Deleted

## 2022-07-18 NOTE — Telephone Encounter (Signed)
Please contact patient for 1 year follow up. Last seen 05-31-2021.  Thank you.

## 2022-07-19 MED ORDER — ISOSORBIDE MONONITRATE ER 30 MG PO TB24: 30.0000 mg | ORAL_TABLET | Freq: Every day | ORAL | 0 refills | Status: DC

## 2022-08-20 NOTE — Progress Notes (Unsigned)
Cardiology Clinic Note   Patient Name: Chase Scott Date of Encounter: 08/21/2022  Primary Care Provider:  Baxter Hire, MD Primary Cardiologist:  [name], MD  Patient Profile    66 year old male with a history of CAD status post CABG in 2004 with subsequent PCI of the left circumflex and RCA, HFpEF, hypertension, hyperlipidemia who presents for follow-up today on his CAD and HFpEF.  Past Medical History    Past Medical History:  Diagnosis Date   Alcoholism ([place])    Anginal pain ([place])    CAD (coronary artery disease)    a. 01/2003 s/p PCI to RCA;  b. 02/2003 s/ CABG x 4 (LIMA->LAD, RIMA->RCA, VG->Diag, VG->LCX; c. 2005 s/p PCI to LCX; d. 06/2012 Cath: LM nl, LAD 163m LCX mild plaque, OM1 min irregs, OM2 patent stent, RCA 135mSR, VG->LCX 100, VG->Diag ok, RIMA->RCA 100, LIMA->LAD ok, EF 50-55%-->Med Rx; d. 11/2017 Cath: stable anatomy, 2/4 patent grafts. LCX 30 ISR, RCA 50ost, 20 ISR-->Med Rx.   Chronic Chest pain    Diastolic dysfunction    a. 11/2017 Echo: EF 50-55%, no rwma, Gr1 DD, mildly dil LA; b. 02/2019 Echo: EF 50-55%, pseudonormalization. Mildly dil RA.   Dyslipidemia    Gastroesophageal reflux disease    Gastrointestinal symptoms    Chronic gastrointestinal symptoms   History of kidney stones    History of left shoulder fracture    Hyperlipidemia    Hypertensive cardiovascular disease    a. Labile BPs.   OSA (obstructive sleep apnea)    PVC's (premature ventricular contractions)    a. 2019 Holter: 18% PVC burden.   Rotator cuff syndrome    Past Surgical History:  Procedure Laterality Date   Arthroscopic Knee Surgery Right 2009   Arthroscopic Knee Surgery Left 10/2017   BACK SURGERY     Lower   CARDIAC CATHETERIZATION     COLONOSCOPY WITH PROPOFOL N/A 04/29/2018   Procedure: COLONOSCOPY WITH PROPOFOL;  Surgeon: ElManya SilvasMD;  [facility];  Service: Endoscopy;  Laterality: N/A;   CORONARY ARTERY BYPASS GRAFT  April 2004   x  four   ESOPHAGOGASTRODUODENOSCOPY (EGD) WITH PROPOFOL N/A 04/29/2018   Procedure: ESOPHAGOGASTRODUODENOSCOPY (EGD) WITH PROPOFOL;  Surgeon: ElManya SilvasMD;  [facility];  Service: Endoscopy;  Laterality: N/A;   LEFT HEART CATH AND CORONARY ANGIOGRAPHY N/A 12/14/2017   Procedure: LEFT HEART CATH AND CORONARY ANGIOGRAPHY;  Surgeon: ArWellington HampshireMD;  [facility];  Service: Cardiovascular;  Laterality: N/A;   LEFT HEART CATHETERIZATION WITH CORONARY/GRAFT ANGIOGRAM  07/02/2012   Procedure: LEFT HEART CATHETERIZATION WITH COBeatrix Fetters Surgeon: ChBurnell BlanksMD;  [facility];  Service: Cardiovascular;;   TONSILLECTOMY      Allergies  Allergies  Allergen Reactions   Penicillins Anaphylaxis   Sulfa Antibiotics Anaphylaxis   Etodolac Other (See Comments)    unknown   Lipitor [Atorvastatin Calcium]     Myalgias @ 80 mg   Paxil [Paroxetine Hcl] Other (See Comments)    unknown   Statins     Myalgias on high dose lipitor and moderate dose crestor.   Sulfasalazine Other (See Comments)    unknown    History of Present Illness    [age]ear old male with a history of CAD status post CABG in 2004 with subsequent PCI of the left circumflex and RCA, HFpEF, hypertension, and hyperlipidemia.  Hospitalized in January 2019 for chest pain.  Echocardiogram showed EF 50%,  G1DD.  Left heart catheterization revealed significant three-vessel disease with patent stents in the left circumflex and right coronary artery.  Patent LIMA to the LAD and SVG to the diagonal.  SVG to the left circumflex was chronically occluded with atretic RIMA to the RCA.  There was some moderate ostial RCA stenosis with catheter induced spasm.    He will be monitored in April 2019 which showed 28,000 PVCs within 48 hours, which is an 18% burden.  Patient was generally asymptomatic to his PVC's.  He also has a known history of hyperlipidemia with a statin  intolerance.  He has tolerated Repatha with improvement.  He was seen on televisit 11/26/20 with improved chest pain since starting CPAP. He had also reduced his ranexa dose to 500 mg bid.   Last seen in clinic 7/722 and was without complaints from the cardiac standpoint.   He has been evaluated in the emergency department in January and February for complaints of severe back pain.  He returns to clinic today with continued complaints of back discomfort.  He has received several injections in his back which did seem to help with the discomfort he has been suffering from.  He had previously also been like that for have a microdiscectomy which that surgery was canceled due to the relief that he was receiving from the steroids.  He denies any chest pain, chest pressure, palpitations, or shortness of breath.  He denies any recent hospitalizations or visits to the emergency department.  He does make note he is concerned of his weight gain, elevated blood pressure, and feels as though that his CPAP is not working as well as it was prior.     Home Medications    Current Outpatient Medications  Medication Sig Dispense Refill   clopidogrel (PLAVIX) 75 MG tablet Take 1 tablet (75 mg total) by mouth daily. 90 tablet 1   gabapentin (NEURONTIN) 300 MG capsule Take 300 mg by mouth daily.     ibuprofen (ADVIL) 200 MG tablet Take 400 mg by mouth daily.     isosorbide mononitrate (IMDUR) 30 MG 24 hr tablet Take 1 tablet (30 mg total) by mouth daily. 60 tablet 0   losartan (COZAAR) 25 MG tablet Take 1 tablet (25 mg total) by mouth daily. 90 tablet 3   metoprolol succinate (TOPROL-XL) 100 MG 24 hr tablet Take 50-100 mg by mouth See admin instructions. Takes 100 mg in the morning and 50 mg at night     ranolazine (RANEXA) 500 MG 12 hr tablet TAKE ONE TABLET BY MOUTH TWICE A DAY 180 tablet 2   REPATHA SURECLICK 161 MG/ML SOAJ INJECT 1 PEN INTO THE MUSCLE EVERY 14 DAYS (Patient taking differently: Inject 140 mg into  the vein every 14 (fourteen) days.) 2 mL 11   Ascorbic Acid (VITAMIN C) 1000 MG tablet Take 1,000 mg by mouth daily.     aspirin EC 81 MG EC tablet Take 1 tablet (81 mg total) by mouth daily. (Patient not taking: Reported on 12/21/2021) 180 tablet 0   diclofenac (VOLTAREN) 75 MG EC tablet Take 75 mg by mouth 2 (two) times daily.     esomeprazole (NEXIUM) 20 MG capsule Take 40 mg by mouth daily as needed (acid reflux).     HYDROcodone-acetaminophen (NORCO/VICODIN) 5-325 MG tablet Take 1 tablet by mouth every 4 (four) hours as needed for up to 7 days for moderate pain. 42 tablet 0   HYDROmorphone (DILAUDID) 4 MG tablet Take 1 tablet (4 mg  total) by mouth every 4 (four) hours as needed for severe pain. (Patient not taking: Reported on 08/21/2022) 15 tablet 0   methylPREDNISolone (MEDROL DOSEPAK) 4 MG TBPK tablet Day 1: 24 mg on day 1 administered as 8 mg (2 tablets) before breakfast, 4 mg (1 tablet) after lunch, 4 mg (1 tablet) after supper, and 8 mg (2 tablets) at bedtime or 24 mg (6 tablets) as a single dose or divided into 2 or 3 doses upon initiation (regardless of time of day).   Day 2: 20 mg on day 2 administered as 4 mg (1 tablet) before breakfast, 4 mg (1 tablet) after lunch, 4 mg (1 tablet) after supper, and 8 mg (2 tablets) at bedtime.   Day 3: 16 mg on day 3 administered as 4 mg (1 tablet) before breakfast, 4 mg (1 tablet) after lunch, 4 mg (1 tablet) after supper, and 4 mg (1 tablet) at bedtime.   Day 4: 12 mg on day 4 administered as 4 mg (1 tablet) before breakfast, 4 mg (1 tablet) after lunch, and 4 mg (1 tablet) at bedtime.   Day 5: 8 mg on day 5 administered as 4 mg (1 tablet) before breakfast and 4 mg (1 tablet) at bedtime.    Day 6: 4 mg on day 6 administered as 4 mg (1 tablet) before breakfast. (Patient not taking: Reported on 08/21/2022) 21 tablet 0   nitroGLYCERIN (NITROSTAT) 0.4 MG SL tablet Place 1 tablet (0.4 mg total) under the tongue every 5 (five) minutes as needed for chest  pain. (Patient not taking: Reported on 08/21/2022) 30 tablet 0   No current facility-administered medications for this visit.     Family History    Family History  Problem Relation Age of Onset   Other Mother        - "back problems and breathing problems."   Other Father        Died @ age 47 - pt doesn't know father's medical history.   Microcephaly Sister    Heart attack Sister    He indicated that his mother is alive. He indicated that his father is deceased. He indicated that his sister is alive.  Social History    Social History   Socioeconomic History   Marital status: Married    Spouse name: Not on file   Number of children: Not on file   Years of education: Not on file   Highest education level: Not on file  Occupational History   Occupation: "Maintenance work"  Tobacco Use   Smoking status: Former    Packs/day: 1.00    Years: 15.00    Total pack years: 15.00    Types: Cigarettes    Quit date: 11/21/1999    Years since quitting: 22.7   Smokeless tobacco: Never  Vaping Use   Vaping Use: Never used  Substance and Sexual Activity   Alcohol use: No   Drug use: No   Sexual activity: Not on file  Other Topics Concern   Not on file  Social History Narrative   Lives in [place] with wife.  Works in Maintenance.   Social Determinants of Health   Financial Resource Strain: Not on file  Food Insecurity: Not on file  Transportation Needs: Not on file  Physical Activity: Not on file  Stress: Not on file  Social Connections: Not on file  Intimate Partner Violence: Not on file     Review of Systems    General:  No chills, fever,  night sweats or weight changes.  Cardiovascular:  No chest pain, dyspnea on exertion, endorses occasional edema, but denies orthopnea, palpitations, paroxysmal nocturnal dyspnea. Dermatological: No rash, lesions/masses Respiratory: No cough, dyspnea Urologic: No hematuria, dysuria Abdominal:   No nausea, vomiting, diarrhea, bright  red blood per rectum, melena, or hematemesis Musculoskeletal: Endorses continued back discomfort Neurologic:  No visual changes, wkns, changes in mental status. All other systems reviewed and are otherwise negative except as noted above.     Physical Exam    VS:  BP (!) 152/86   Pulse 70   Ht 5' 8.5" (1.74 m)   Wt 248 lb 6.4 oz (112.7 kg)   SpO2 96%   BMI 37.22 kg/m  , BMI Body mass index is 37.22 kg/m.     Vitals:   08/21/22 0916 08/21/22 0930  BP: (!) 164/95 (!) 152/86    GEN: Well nourished, well developed, in no acute distress. HEENT: normal. Neck: Supple, no JVD, carotid bruits, or masses. Cardiac: RRR, no murmurs, rubs, or gallops. No clubbing, cyanosis, trace pretibial edema.  Radials/DP/PT 2+ and equal bilaterally.  Respiratory:  Respirations regular and unlabored, clear to auscultation bilaterally. GI: Soft, nontender, nondistended, BS + x 4. MS: no deformity or atrophy. Skin: warm and dry, no rash. Neuro:  Strength and sensation are intact. Psych: Normal affect.  Accessory Clinical Findings    ECG personally reviewed by me today-sinus rhythm with a rate of 70 with occasional unifocal PVCs, nonspecific ST and T wave abnormality, and left axis deviation noted- No acute changes  Lab Results  Component Value Date   WBC 9.0 12/21/2021   HGB 15.6 12/21/2021   HCT 44.4 12/21/2021   MCV 93.1 12/21/2021   PLT 290 12/21/2021   Lab Results  Component Value Date   CREATININE 1.18 12/21/2021   BUN 15 12/21/2021   NA 137 12/21/2021   K 4.3 12/21/2021   CL 105 12/21/2021   CO2 24 12/21/2021   Lab Results  Component Value Date   ALT 19 01/03/2019   AST 22 01/03/2019   ALKPHOS 47 01/03/2019   BILITOT 0.8 01/03/2019   Lab Results  Component Value Date   CHOL 80 10/10/2018   HDL 58 10/10/2018   LDLCALC 13 10/10/2018   TRIG 43 10/10/2018   CHOLHDL 1.4 10/10/2018    Lab Results  Component Value Date   HGBA1C 6.1 (H) 12/22/2021    Assessment & Plan    1.  CAD status post CABG with subsequent stenting with today stating he remains chest pain-free primarily since being on Imdur and Ranexa as well and is using his CPAP.  He states that previously his chest pain was resolved with initiation of CPAP use.  His last heart catheterization in 2019 revealed known three-vessel disease with patent stents, patent LIMA to LAD and's SVG to diagonal, known CTO of SVG to left circumflex and atretic RIMA to RCA, recommended medical management.  He states that since he has had such back pain his activity has decreased but he is recently purchased a recumbent bicycle and is trying to do that 10 to 15 minutes twice a day to increase his activity.  He has been continued on Plavix, Repatha, Imdur and Ranexa.  He is also being scheduled for an echocardiogram for his CAD prior CABG and elevated blood pressures.  2.  Essential hypertension blood pressure 164/95 repeat blood pressure 152/86. States that his blood pressure has been up on occasion.  He is continued on metoprolol  100 mg daily and Imdur 30 mg daily.  With his elevated pressures even During hospital visits we will start him on losartan 25 mg daily.  He is also being scheduled for an echocardiogram for his elevated blood pressures with his last echocardiogram revealed an EF 50-55%, mildly dilated right atrium, without valvular abnormalities.  3.  Hyperlipidemia with his last LDL of 90 on 02/10/2022.  He has been continued on Repatha and we are waiting outside blood work for updated lipid panel.  4.  PVCs noted on EKG.  Patient remains asymptomatic.  He had worn a Holter monitor previously.  Continued on Toprol-XL 100 mg daily.  5.  Disposition patient return to clinic to see MD/APP after echocardiogram is completed as well as to reevaluate initiation of losartan 25 mg daily patient vies to the point he would then go back to his yearly scheduling if his blood pressure was better controlled.  Collins Kerby,  NP 08/21/2022, 10:14 AM

## 2022-08-21 ENCOUNTER — Encounter: Payer: Self-pay | Admitting: Cardiology

## 2022-08-21 ENCOUNTER — Ambulatory Visit: Payer: Medicare Other | Attending: Cardiology | Admitting: Cardiology

## 2022-08-21 VITALS — BP 152/86 | HR 70 | Ht 68.5 in | Wt 248.4 lb

## 2022-08-21 DIAGNOSIS — I25709 Atherosclerosis of coronary artery bypass graft(s), unspecified, with unspecified angina pectoris: Secondary | ICD-10-CM | POA: Diagnosis not present

## 2022-08-21 DIAGNOSIS — I493 Ventricular premature depolarization: Secondary | ICD-10-CM

## 2022-08-21 DIAGNOSIS — I10 Essential (primary) hypertension: Secondary | ICD-10-CM

## 2022-08-21 DIAGNOSIS — I2510 Atherosclerotic heart disease of native coronary artery without angina pectoris: Secondary | ICD-10-CM

## 2022-08-21 DIAGNOSIS — E785 Hyperlipidemia, unspecified: Secondary | ICD-10-CM

## 2022-08-21 MED ORDER — LOSARTAN POTASSIUM 25 MG PO TABS: 25.0000 mg | ORAL_TABLET | Freq: Every day | ORAL | 3 refills | Status: DC

## 2022-08-21 NOTE — Patient Instructions (Signed)
Medication Instructions:   START Losartan '25mg'$  - take one tablet by mouth daily.   *If you need a refill on your cardiac medications before your next appointment, please call your pharmacy*   Lab Work:  None Ordered  If you have labs (blood work) drawn today and your tests are completely normal, you will receive your results only by: [place] (if you have MyChart) OR A paper copy in the mail If you have any lab test that is abnormal or we need to change your treatment, we will call you to review the results.   Testing/Procedures:  Echocardiogram   Your physician has requested that you have an echocardiogram. Echocardiography is a painless test that uses sound waves to create images of your heart. It provides your doctor with information about the size and shape of your heart and how well your heart's chambers and valves are working. This procedure takes approximately one hour. There are no restrictions for this procedure. Please note; depending on visual quality an IV may need to be placed.     Follow-Up: At [facility], you and your health needs are our priority.  As part of our continuing mission to provide you with exceptional heart care, we have created designated Provider Care Teams.  These Care Teams include your primary Cardiologist (physician) and Advanced Practice Providers (APPs -  Physician Assistants and Nurse Practitioners) who all work together to provide you with the care you need, when you need it.  We recommend signing up for the patient portal called "MyChart".  Sign up information is provided on this After Visit Summary.  MyChart is used to connect with patients for Virtual Visits (Telemedicine).  Patients are able to view lab/test results, encounter notes, upcoming appointments, etc.  Non-urgent messages can be sent to your provider as well.   To learn more about what you can do with MyChart, go to [email or web address].    Your next appointment:    After ECHO   The format for your next appointment:   In Person  Provider:   You may see [name], MD or one of the following Advanced Practice Providers on your designated Care Team:   Murray Hodgkins, NP Christell Faith, PA-C Cadence Kathlen Mody, PA-C Gerrie Nordmann, NP     Important Information About Sugar

## 2022-09-15 ENCOUNTER — Other Ambulatory Visit: Payer: Self-pay | Admitting: Cardiovascular Disease

## 2022-09-28 ENCOUNTER — Ambulatory Visit: Payer: Medicare Other | Attending: Cardiology

## 2022-09-28 DIAGNOSIS — I25709 Atherosclerosis of coronary artery bypass graft(s), unspecified, with unspecified angina pectoris: Secondary | ICD-10-CM | POA: Diagnosis not present

## 2022-09-28 LAB — ECHOCARDIOGRAM COMPLETE
AR max vel: 2.71 cm2
AV Area VTI: 2.8 cm2
AV Area mean vel: 2.61 cm2
AV Mean grad: 4 mmHg
AV Peak grad: 7.5 mmHg
Ao pk vel: 1.37 m/s
Area-P 1/2: 4.74 cm2
S' Lateral: 4.1 cm

## 2022-09-28 MED ORDER — PERFLUTREN LIPID MICROSPHERE
1.0000 mL | INTRAVENOUS | Status: AC | PRN
  Administered 2022-09-28: 2 mL via INTRAVENOUS

## 2022-10-02 NOTE — Progress Notes (Unsigned)
Cardiology Clinic Note   Patient Name: Chase Scott Date of Encounter: 10/04/2022  Primary Care Provider:  Baxter Hire, MD Primary Cardiologist:  [name], MD  Patient Profile    66 year old male with a history of CAD status post CABG in 2004 with subsequent PCI to the left circumflex and RCA, HFpEF, hypertension, hyperlipidemia, who presents today for follow-up of his coronary artery disease and HFpEF.  Past Medical History    Past Medical History:  Diagnosis Date   Alcoholism ([place])    Anginal pain ([place])    CAD (coronary artery disease)    a. 01/2003 s/p PCI to RCA;  b. 02/2003 s/ CABG x 4 (LIMA->LAD, RIMA->RCA, VG->Diag, VG->LCX; c. 2005 s/p PCI to LCX; d. 06/2012 Cath: LM nl, LAD 125m LCX mild plaque, OM1 min irregs, OM2 patent stent, RCA 181mSR, VG->LCX 100, VG->Diag ok, RIMA->RCA 100, LIMA->LAD ok, EF 50-55%-->Med Rx; d. 11/2017 Cath: stable anatomy, 2/4 patent grafts. LCX 30 ISR, RCA 50ost, 20 ISR-->Med Rx.   Chronic Chest pain    Diastolic dysfunction    a. 11/2017 Echo: EF 50-55%, no rwma, Gr1 DD, mildly dil LA; b. 02/2019 Echo: EF 50-55%, pseudonormalization. Mildly dil RA.   Dyslipidemia    Gastroesophageal reflux disease    Gastrointestinal symptoms    Chronic gastrointestinal symptoms   History of kidney stones    History of left shoulder fracture    Hyperlipidemia    Hypertensive cardiovascular disease    a. Labile BPs.   OSA (obstructive sleep apnea)    PVC's (premature ventricular contractions)    a. 2019 Holter: 18% PVC burden.   Rotator cuff syndrome    Past Surgical History:  Procedure Laterality Date   Arthroscopic Knee Surgery Right 2009   Arthroscopic Knee Surgery Left 10/2017   BACK SURGERY     Lower   CARDIAC CATHETERIZATION     COLONOSCOPY WITH PROPOFOL N/A 04/29/2018   Procedure: COLONOSCOPY WITH PROPOFOL;  Surgeon: ElManya SilvasMD;  [facility];  Service: Endoscopy;  Laterality: N/A;   CORONARY ARTERY BYPASS  GRAFT  April 2004   x four   ESOPHAGOGASTRODUODENOSCOPY (EGD) WITH PROPOFOL N/A 04/29/2018   Procedure: ESOPHAGOGASTRODUODENOSCOPY (EGD) WITH PROPOFOL;  Surgeon: ElManya SilvasMD;  [facility];  Service: Endoscopy;  Laterality: N/A;   LEFT HEART CATH AND CORONARY ANGIOGRAPHY N/A 12/14/2017   Procedure: LEFT HEART CATH AND CORONARY ANGIOGRAPHY;  Surgeon: ArWellington HampshireMD;  [facility];  Service: Cardiovascular;  Laterality: N/A;   LEFT HEART CATHETERIZATION WITH CORONARY/GRAFT ANGIOGRAM  07/02/2012   Procedure: LEFT HEART CATHETERIZATION WITH COBeatrix Fetters Surgeon: ChBurnell BlanksMD;  [facility];  Service: Cardiovascular;;   TONSILLECTOMY      Allergies  Allergies  Allergen Reactions   Penicillins Anaphylaxis   Sulfa Antibiotics Anaphylaxis   Etodolac Other (See Comments)    unknown   Lipitor [Atorvastatin Calcium]     Myalgias @ 80 mg   Paxil [Paroxetine Hcl] Other (See Comments)    unknown   Statins     Myalgias on high dose lipitor and moderate dose crestor.   Sulfasalazine Other (See Comments)    unknown    History of Present Illness    Chase Scott a 89ear old male with a history of coronary artery disease status post CABG in 2004 with subsequent PCI to the left circumflex and RCA, HFpEF, hypertension, and hyperlipidemia.  He was originally  hospitalized in January 2019 for chest pain.  Echocardiogram showed EF of 50%, G1 DD, left heart catheterization revealed significant 3 vessel disease with patent stents in the left circumflex and right coronary artery.  Patent LIMA to the LAD and SFV G to the diagonal.  SVG to the left circumflex was chronically occluded and atretic RIMA to the RCA.  There was some moderate ostial RCA stenosis with catheter induced spasm.  He did wear a long-term monitor in 02/2018 which showed 28,000 PVCs within 48 hours, which was 18% burden.  Patient was generally asymptomatic  to his PVCs at the time.  Known history of hyperlipidemia with statin intolerance.  But he is able to tolerate Repatha.  He was last seen in clinic 08/21/2022 where he continued to have complaints of back discomfort.  He received several injections but unfortunately did not alleviate his discomfort.  He was continued on his current medication regimen with the addition of losartan 25 mg daily and was scheduled for an echocardiogram.  Echocardiogram revealed LVEF of 50-55%, no regional wall motion abnormalities, G2 DD, moderately dilated left atrium, mild mitral regurgitation.  He returns to clinic today stating that he has been doing fairly well.  He denies any chest pain or worsening shortness of breath.  He states that his shortness of breath drastically improved once he started with his CPAP as well as his chest pain.  He continues to endorse some back pain where he has recently had injections and states that the pain is surprisingly improved so surgery has been delayed as the injections have helped.  He was recently started on losartan 25 mg daily for elevated blood pressures and has tolerated the medication well without difficulty and improved blood pressure noted.  He denies any hospitalizations or visits to the emergency department.  Home Medications    Current Outpatient Medications  Medication Sig Dispense Refill   Ascorbic Acid (VITAMIN C) 1000 MG tablet Take 1,000 mg by mouth daily.     clopidogrel (PLAVIX) 75 MG tablet Take 1 tablet (75 mg total) by mouth daily. 90 tablet 1   diclofenac (VOLTAREN) 75 MG EC tablet Take 75 mg by mouth 2 (two) times daily.     esomeprazole (NEXIUM) 20 MG capsule Take 40 mg by mouth daily as needed (acid reflux).     gabapentin (NEURONTIN) 300 MG capsule Take 300 mg by mouth daily.     ibuprofen (ADVIL) 200 MG tablet Take 400 mg by mouth daily.     isosorbide mononitrate (IMDUR) 30 MG 24 hr tablet TAKE 1 TABLET BY MOUTH DAILY 60 tablet 0   losartan  (COZAAR) 25 MG tablet Take 1 tablet (25 mg total) by mouth daily. 90 tablet 3   metoprolol succinate (TOPROL-XL) 100 MG 24 hr tablet Take 50-100 mg by mouth See admin instructions. Takes 100 mg in the morning and 50 mg at night     ranolazine (RANEXA) 500 MG 12 hr tablet TAKE ONE TABLET BY MOUTH TWICE A DAY 180 tablet 2   REPATHA SURECLICK 829 MG/ML SOAJ INJECT 1 PEN INTO THE MUSCLE EVERY 14 DAYS (Patient taking differently: Inject 140 mg into the vein every 14 (fourteen) days.) 2 mL 11   aspirin EC 81 MG EC tablet Take 1 tablet (81 mg total) by mouth daily. (Patient not taking: Reported on 12/21/2021) 180 tablet 0   HYDROcodone-acetaminophen (NORCO/VICODIN) 5-325 MG tablet Take 1 tablet by mouth every 4 (four) hours as needed for up to 7 days for  moderate pain. 42 tablet 0   HYDROmorphone (DILAUDID) 4 MG tablet Take 1 tablet (4 mg total) by mouth every 4 (four) hours as needed for severe pain. (Patient not taking: Reported on 08/21/2022) 15 tablet 0   methylPREDNISolone (MEDROL DOSEPAK) 4 MG TBPK tablet Day 1: 24 mg on day 1 administered as 8 mg (2 tablets) before breakfast, 4 mg (1 tablet) after lunch, 4 mg (1 tablet) after supper, and 8 mg (2 tablets) at bedtime or 24 mg (6 tablets) as a single dose or divided into 2 or 3 doses upon initiation (regardless of time of day).   Day 2: 20 mg on day 2 administered as 4 mg (1 tablet) before breakfast, 4 mg (1 tablet) after lunch, 4 mg (1 tablet) after supper, and 8 mg (2 tablets) at bedtime.   Day 3: 16 mg on day 3 administered as 4 mg (1 tablet) before breakfast, 4 mg (1 tablet) after lunch, 4 mg (1 tablet) after supper, and 4 mg (1 tablet) at bedtime.   Day 4: 12 mg on day 4 administered as 4 mg (1 tablet) before breakfast, 4 mg (1 tablet) after lunch, and 4 mg (1 tablet) at bedtime.   Day 5: 8 mg on day 5 administered as 4 mg (1 tablet) before breakfast and 4 mg (1 tablet) at bedtime.    Day 6: 4 mg on day 6 administered as 4 mg (1 tablet) before  breakfast. (Patient not taking: Reported on 08/21/2022) 21 tablet 0   nitroGLYCERIN (NITROSTAT) 0.4 MG SL tablet Place 1 tablet (0.4 mg total) under the tongue every 5 (five) minutes as needed for chest pain. (Patient not taking: Reported on 08/21/2022) 30 tablet 0   No current facility-administered medications for this visit.     Family History    Family History  Problem Relation Age of Onset   Other Mother        - "back problems and breathing problems."   Other Father        Died @ age 41 - pt doesn't know father's medical history.   Microcephaly Sister    Heart attack Sister    He indicated that his mother is alive. He indicated that his father is deceased. He indicated that his sister is alive.  Social History    Social History   Socioeconomic History   Marital status: Married    Spouse name: Not on file   Number of children: Not on file   Years of education: Not on file   Highest education level: Not on file  Occupational History   Occupation: "Maintenance work"  Tobacco Use   Smoking status: Former    Packs/day: 1.00    Years: 15.00    Total pack years: 15.00    Types: Cigarettes    Quit date: 11/21/1999    Years since quitting: 22.8   Smokeless tobacco: Never  Vaping Use   Vaping Use: Never used  Substance and Sexual Activity   Alcohol use: No   Drug use: No   Sexual activity: Not on file  Other Topics Concern   Not on file  Social History Narrative   Lives in [place] with wife.  Works in Maintenance.   Social Determinants of Health   Financial Resource Strain: Not on file  Food Insecurity: Not on file  Transportation Needs: Not on file  Physical Activity: Not on file  Stress: Not on file  Social Connections: Not on file  Intimate Partner Violence: Not  on file     Review of Systems    General:  No chills, fever, night sweats or weight changes.  Cardiovascular:  No chest pain, dyspnea on exertion, edema, orthopnea, palpitations, paroxysmal  nocturnal dyspnea. Dermatological: No rash, lesions/masses Respiratory: No cough, dyspnea Urologic: No hematuria, dysuria Musculoskeletal: Endorses chronic back pain that is slightly improved since injections Abdominal:   No nausea, vomiting, diarrhea, bright red blood per rectum, melena, or hematemesis Neurologic:  No visual changes, wkns, changes in mental status. All other systems reviewed and are otherwise negative except as noted above.     Physical Exam    VS:  BP 112/68 (BP Location: Left Arm, Patient Position: Sitting, Cuff Size: Large)   Pulse 81   Ht '5\' 9"'$  (1.753 m)   Wt 250 lb 12.8 oz (113.8 kg)   SpO2 95%   BMI 37.04 kg/m  , BMI Body mass index is 37.04 kg/m.     GEN: Well nourished, well developed, in no acute distress. HEENT: normal. Neck: Supple, no JVD, carotid bruits, or masses. Cardiac: RRR, no murmurs, rubs, or gallops. No clubbing, cyanosis, edema.  Radials/DP/PT 2+ and equal bilaterally.  Respiratory:  Respirations regular and unlabored, clear to auscultation bilaterally. GI: Soft, nontender, nondistended, BS + x 4. MS: no deformity or atrophy. Skin: warm and dry, no rash. Neuro:  Strength and sensation are intact. Psych: Normal affect.  Accessory Clinical Findings    ECG personally reviewed by me today-no new tracings were completed today  Lab Results  Component Value Date   WBC 9.0 12/21/2021   HGB 15.6 12/21/2021   HCT 44.4 12/21/2021   MCV 93.1 12/21/2021   PLT 290 12/21/2021   Lab Results  Component Value Date   CREATININE 1.18 12/21/2021   BUN 15 12/21/2021   NA 137 12/21/2021   K 4.3 12/21/2021   CL 105 12/21/2021   CO2 24 12/21/2021   Lab Results  Component Value Date   ALT 19 01/03/2019   AST 22 01/03/2019   ALKPHOS 47 01/03/2019   BILITOT 0.8 01/03/2019   Lab Results  Component Value Date   CHOL 80 10/10/2018   HDL 58 10/10/2018   LDLCALC 13 10/10/2018   TRIG 43 10/10/2018   CHOLHDL 1.4 10/10/2018    Lab Results   Component Value Date   HGBA1C 6.1 (H) 12/22/2021    Assessment & Plan   1.  Coronary artery disease status post CABG with subsequent stenting without stable angina.  He remains chest pain-free primarily since continuing on Imdur 30 mg daily and Ranexa 500 mg twice daily.  He states that his chest discomfort resolved with initiation of CPAP.  Last heart catheterization was in 2019 revealed known three-vessel disease with patent stents.  Patent LIMA to the LAD and SVGs to diagonal with known CTO of SVG to left circumflex and atretic RIMA to RCA.  He has been trying to increase his exercise since receiving injections into his back which is improved his back pain.  He has been continued on Plavix, reports Repatha, Imdur, and Ranexa.  2.  Essential hypertension with blood pressure today of 112/68.  Blood pressures have been well controlled since the initiation of losartan 25 mg daily.  He is continued on Toprol-XL losartan, and Imdur.  I also had recent echocardiogram which revealed LVEF of 50-55%, no regional wall motion abnormalities, G2 DD, moderately dilated left atrium, mild mitral regurgitation.  3. Hyperlipidemia with a last LDL of 90 on 02/10/2022.  Has  been continued on Repatha.  Had previously been following with lipid clinic.  4.  Obstructive sleep apnea where he is compliant with CPAP.  5.  Disposition patient return to clinic to see MD/APP in 6 months or sooner if needed.  Inette Doubrava, NP 10/04/2022, 3:10 PM

## 2022-10-04 ENCOUNTER — Encounter: Payer: Self-pay | Admitting: Cardiology

## 2022-10-04 ENCOUNTER — Ambulatory Visit: Payer: Medicare Other | Attending: Cardiology | Admitting: Cardiology

## 2022-10-04 VITALS — BP 112/68 | HR 81 | Ht 69.0 in | Wt 250.8 lb

## 2022-10-04 DIAGNOSIS — I10 Essential (primary) hypertension: Secondary | ICD-10-CM | POA: Diagnosis not present

## 2022-10-04 DIAGNOSIS — G4733 Obstructive sleep apnea (adult) (pediatric): Secondary | ICD-10-CM

## 2022-10-04 DIAGNOSIS — I25709 Atherosclerosis of coronary artery bypass graft(s), unspecified, with unspecified angina pectoris: Secondary | ICD-10-CM

## 2022-10-04 DIAGNOSIS — E785 Hyperlipidemia, unspecified: Secondary | ICD-10-CM | POA: Diagnosis not present

## 2022-10-04 NOTE — Patient Instructions (Signed)
Medication Instructions:   Your physician recommends that you continue on your current medications as directed. Please refer to the Current Medication list given to you today.  *If you need a refill on your cardiac medications before your next appointment, please call your pharmacy*  Follow-Up: At [facility], you and your health needs are our priority.  As part of our continuing mission to provide you with exceptional heart care, we have created designated Provider Care Teams.  These Care Teams include your primary Cardiologist (physician) and Advanced Practice Providers (APPs -  Physician Assistants and Nurse Practitioners) who all work together to provide you with the care you need, when you need it.  We recommend signing up for the patient portal called "MyChart".  Sign up information is provided on this After Visit Summary.  MyChart is used to connect with patients for Virtual Visits (Telemedicine).  Patients are able to view lab/test results, encounter notes, upcoming appointments, etc.  Non-urgent messages can be sent to your provider as well.   To learn more about what you can do with MyChart, go to [email or web address].    Your next appointment:   6 month(s)  The format for your next appointment:   In Person  Provider:   You may see [name], MD or one of the following Advanced Practice Providers on your designated Care Team:   Murray Hodgkins, NP Christell Faith, PA-C Cadence Kathlen Mody, PA-C Gerrie Nordmann, NP    Other Instructions   Important Information About Sugar

## 2022-11-12 ENCOUNTER — Other Ambulatory Visit: Payer: Self-pay | Admitting: Cardiovascular Disease

## 2022-11-21 ENCOUNTER — Other Ambulatory Visit: Payer: Self-pay

## 2022-11-21 MED ORDER — RANOLAZINE ER 500 MG PO TB12: 500.0000 mg | ORAL_TABLET | Freq: Two times a day (BID) | ORAL | 0 refills | Status: DC

## 2022-11-22 ENCOUNTER — Ambulatory Visit
Admission: EM | Admit: 2022-11-22 | Discharge: 2022-11-22 | Disposition: A | Discharge status: Home / Self Care | Payer: Medicare Other

## 2022-11-22 DIAGNOSIS — J209 Acute bronchitis, unspecified: Secondary | ICD-10-CM | POA: Diagnosis not present

## 2022-11-22 MED ORDER — PREDNISONE 10 MG PO TABS: 40.0000 mg | ORAL_TABLET | Freq: Every day | ORAL | 0 refills | Status: AC

## 2022-11-22 MED ORDER — AZITHROMYCIN 250 MG PO TABS: 250.0000 mg | ORAL_TABLET | Freq: Every day | ORAL | 0 refills | Status: DC

## 2022-11-22 NOTE — ED Provider Notes (Signed)
Chase Scott    CSN: [id number] Arrival date & time: 11/22/22  1452      History   Chief Complaint Chief Complaint  Patient presents with   Nasal Congestion    HPI Chase Scott is a 67 y.o. male.  Patient presents with 10-day history of congestion and cough.  He reports possible subjective fever at the onset of his symptoms but none since.  He denies chest pain, shortness of breath, or other symptoms.  Treatment at home with several OTC cold and flu medications.  His medical history includes hypertension, ischemic heart disease, hypercholesterolemia, CABG, diastolic dysfunction, obstructive sleep apnea.  The history is provided by the patient and medical records.    Past Medical History:  Diagnosis Date   Alcoholism ([place])    Anginal pain ([place])    CAD (coronary artery disease)    a. 01/2003 s/p PCI to RCA;  b. 02/2003 s/ CABG x 4 (LIMA->LAD, RIMA->RCA, VG->Diag, VG->LCX; c. 2005 s/p PCI to LCX; d. 06/2012 Cath: LM nl, LAD 170m LCX mild plaque, OM1 min irregs, OM2 patent stent, RCA 153mSR, VG->LCX 100, VG->Diag ok, RIMA->RCA 100, LIMA->LAD ok, EF 50-55%-->Med Rx; d. 11/2017 Cath: stable anatomy, 2/4 patent grafts. LCX 30 ISR, RCA 50ost, 20 ISR-->Med Rx.   Chronic Chest pain    Diastolic dysfunction    a. 11/2017 Echo: EF 50-55%, no rwma, Gr1 DD, mildly dil LA; b. 02/2019 Echo: EF 50-55%, pseudonormalization. Mildly dil RA.   Dyslipidemia    Gastroesophageal reflux disease    Gastrointestinal symptoms    Chronic gastrointestinal symptoms   History of kidney stones    History of left shoulder fracture    Hyperlipidemia    Hypertensive cardiovascular disease    a. Labile BPs.   OSA (obstructive sleep apnea)    PVC's (premature ventricular contractions)    a. 2019 Holter: 18% PVC burden.   Rotator cuff syndrome     Patient Active Problem List   Diagnosis Date Noted   Severe back pain 12/21/2021   OSA (obstructive sleep apnea) 04/21/2020   PVC's (premature ventricular  contractions) 03/15/2018   Fatigue 03/15/2018   Dyspnea on exertion 03/15/2018   [place]   CAD (coronary artery disease) 07/02/2012   Hypertensive cardiovascular disease 07/02/2012   Chest pain 07/02/2012   Ischemic heart disease 02/16/2011   Hypertension 02/16/2011   Hypercholesterolemia 02/16/2011   Hx of CABG 02/16/2011   GERD (gastroesophageal reflux disease) 02/16/2011    Past Surgical History:  Procedure Laterality Date   Arthroscopic Knee Surgery Right 2009   Arthroscopic Knee Surgery Left 10/2017   BACK SURGERY     Lower   CARDIAC CATHETERIZATION     COLONOSCOPY WITH PROPOFOL N/A 04/29/2018   Procedure: COLONOSCOPY WITH PROPOFOL;  Surgeon: ElManya SilvasMD;  [facility];  Service: Endoscopy;  Laterality: N/A;   CORONARY ARTERY BYPASS GRAFT  April 2004   x four   ESOPHAGOGASTRODUODENOSCOPY (EGD) WITH PROPOFOL N/A 04/29/2018   Procedure: ESOPHAGOGASTRODUODENOSCOPY (EGD) WITH PROPOFOL;  Surgeon: ElManya SilvasMD;  [facility];  Service: Endoscopy;  Laterality: N/A;   LEFT HEART CATH AND CORONARY ANGIOGRAPHY N/A 12/14/2017   Procedure: LEFT HEART CATH AND CORONARY ANGIOGRAPHY;  Surgeon: ArWellington HampshireMD;  [facility];  Service: Cardiovascular;  Laterality: N/A;   LEFT HEART CATHETERIZATION WITH CORONARY/GRAFT ANGIOGRAM  07/02/2012   Procedure: LEFT HEART CATHETERIZATION WITH COBeatrix Fetters Surgeon: ChBurnell BlanksMD;  Location: [place] CATH LAB;  Service: Cardiovascular;;   TONSILLECTOMY         Home Medications    Prior to Admission medications   Medication Sig Start Date End Date Taking? Authorizing Provider  azithromycin (ZITHROMAX) 250 MG tablet Take 1 tablet (250 mg total) by mouth daily. Take first 2 tablets together, then 1 every day until finished. 11/22/22  Yes Sharion Balloon, NP  clopidogrel (PLAVIX) 75 MG tablet Take 1 tablet by mouth daily. 10/26/22  Yes [provider]  predniSONE (DELTASONE) 10 MG tablet Take 4 tablets (40 mg total) by mouth daily for 5 days. 11/22/22 11/27/22 Yes Sharion Balloon, NP  Ascorbic Acid (VITAMIN C) 1000 MG tablet Take 1,000 mg by mouth daily.    [provider]  aspirin EC 81 MG EC tablet Take 1 tablet (81 mg total) by mouth daily. Patient not taking: Reported on 12/21/2021 12/15/17   Salary, Holly Bodily D, MD  clopidogrel (PLAVIX) 75 MG tablet Take 1 tablet (75 mg total) by mouth daily. 08/23/18   Wellington Hampshire, MD  diclofenac (VOLTAREN) 75 MG EC tablet Take 75 mg by mouth 2 (two) times daily. 12/07/21   [provider]  esomeprazole (NEXIUM) 20 MG capsule Take 40 mg by mouth daily as needed (acid reflux).    [provider]  gabapentin (NEURONTIN) 300 MG capsule Take 300 mg by mouth daily. 12/07/21   [provider]  HYDROcodone-acetaminophen (NORCO/VICODIN) 5-325 MG tablet Take 1 tablet by mouth every 4 (four) hours as needed for up to 7 days for moderate pain. 12/22/21 12/29/21  Rick Duff, MD  HYDROmorphone (DILAUDID) 4 MG tablet Take 1 tablet (4 mg total) by mouth every 4 (four) hours as needed for severe pain. Patient not taking: Reported on 08/21/2022 11/21/21   Sherwood Gambler, MD  ibuprofen (ADVIL) 200 MG tablet Take 400 mg by mouth daily.    [provider]  isosorbide mononitrate (IMDUR) 30 MG 24 hr tablet TAKE 1 TABLET BY MOUTH DAILY 11/14/22   Wellington Hampshire, MD  losartan (COZAAR) 25 MG tablet Take 1 tablet (25 mg total) by mouth daily. 08/21/22 08/16/23  Gerrie Nordmann, NP  metoprolol succinate (TOPROL-XL) 100 MG 24 hr tablet Take 50-100 mg by mouth See admin instructions. Takes 100 mg in the morning and 50 mg at night    [provider]  nitroGLYCERIN (NITROSTAT) 0.4 MG SL tablet Place 1 tablet (0.4 mg total) under the tongue every 5 (five) minutes as needed for chest pain. Patient not taking: Reported on 08/21/2022 01/05/19   MayoPete Pelt, MD  ranolazine  (RANEXA) 500 MG 12 hr tablet Take 1 tablet (500 mg total) by mouth 2 (two) times daily. 11/21/22   Wellington Hampshire, MD  REPATHA SURECLICK 409 MG/ML SOAJ INJECT 1 PEN INTO THE MUSCLE EVERY 14 DAYS Patient taking differently: Inject 140 mg into the vein every 14 (fourteen) days. 12/20/21   Furth, Cadence H, PA-C    Family History Family History  Problem Relation Age of Onset   Other Mother        - "back problems and breathing problems."   Other Father        Died @ age 41 - pt doesn't know father's medical history.   Microcephaly Sister    Heart attack Sister     Social History Social History   Tobacco Use   Smoking status: Former    Packs/day: 1.00    Years: 15.00  Total pack years: 15.00    Types: Cigarettes    Quit date: 11/21/1999    Years since quitting: 23.0   Smokeless tobacco: Never  Vaping Use   Vaping Use: Never used  Substance Use Topics   Alcohol use: No   Drug use: No     Allergies   Penicillins, Sulfa antibiotics, Etodolac, Lipitor [atorvastatin calcium], Paxil [paroxetine hcl], Statins, and Sulfasalazine   Review of Systems Review of Systems  Constitutional:  Negative for chills and fever.  HENT:  Positive for congestion. Negative for ear pain and sore throat.   Respiratory:  Positive for cough. Negative for shortness of breath.   Cardiovascular:  Negative for chest pain and palpitations.  Gastrointestinal:  Negative for abdominal pain, diarrhea and vomiting.  Skin:  Negative for color change and rash.  All other systems reviewed and are negative.    Physical Exam Triage Vital Signs ED Triage Vitals  Enc Vitals Group     BP      Pulse      Resp      Temp      Temp src      SpO2      Weight      Height      Head Circumference      Peak Flow      Pain Score      Pain Loc      Pain Edu?      Excl. in [place]?    No data found.  Updated Vital Signs BP 120/78   Pulse 89   Temp 97.8 F (36.6 C)   Resp 18   Ht '5\' 9"'$  (1.753 m)   Wt 237  lb (107.5 kg)   SpO2 97%   BMI 35.00 kg/m   Visual Acuity Right Eye Distance:   Left Eye Distance:   Bilateral Distance:    Right Eye Near:   Left Eye Near:    Bilateral Near:     Physical Exam Vitals and nursing note reviewed.  Constitutional:      General: He is not in acute distress.    Appearance: He is well-developed. He is not ill-appearing.  HENT:     Right Ear: Tympanic membrane normal.     Left Ear: Tympanic membrane normal.     Nose: Nose normal.     Mouth/Throat:     Mouth: Mucous membranes are moist.     Pharynx: Oropharynx is clear.  Cardiovascular:     Rate and Rhythm: Normal rate and regular rhythm.     Heart sounds: Normal heart sounds.  Pulmonary:     Effort: Pulmonary effort is normal. No respiratory distress.     Breath sounds: Normal breath sounds.  Musculoskeletal:     Cervical back: Neck supple.  Skin:    General: Skin is warm and dry.  Neurological:     Mental Status: He is alert.  Psychiatric:        Mood and Affect: Mood normal.        Behavior: Behavior normal.      UC Treatments / Results  Labs (all labs ordered are listed, but only abnormal results are displayed) Labs Reviewed - No data to display  EKG   Radiology No results found.  Procedures Procedures (including critical care time)  Medications Ordered in UC Medications - No data to display  Initial Impression / Assessment and Plan / UC Course  I have reviewed the triage vital signs and the nursing  notes.  Pertinent labs & imaging results that were available during my care of the patient were reviewed by me and considered in my medical decision making (see chart for details).    Acute bronchitis.  Patient has been symptomatic for 10 days.  Treating today with prednisone and Zithromax.  Education provided on acute bronchitis.  Instructed patient to follow up with his PCP if his symptoms are not improving.  He agrees to plan of care.    Final Clinical Impressions(s) /  UC Diagnoses   Final diagnoses:  Acute bronchitis, unspecified organism     Discharge Instructions      Take the prednisone and Zithromax as directed.  Follow up with your primary care provider if your symptoms are not improving.        ED Prescriptions     Medication Sig Dispense Auth. Provider   azithromycin (ZITHROMAX) 250 MG tablet Take 1 tablet (250 mg total) by mouth daily. Take first 2 tablets together, then 1 every day until finished. 6 tablet Sharion Balloon, NP   predniSONE (DELTASONE) 10 MG tablet Take 4 tablets (40 mg total) by mouth daily for 5 days. 20 tablet Sharion Balloon, NP      PDMP not reviewed this encounter.   Sharion Balloon, NP 11/22/22 [phone]

## 2022-11-22 NOTE — ED Triage Notes (Signed)
Patient to Urgent Care with complaints of chest/ head / nasal congestion and cough x 10 days. Possible fever yesterday morning. Cough is productive with thick mucus, reports his cough is mostly dry.   Tried multiple over the counter medication- mucinex/ robitussin/ cold+flu medications.

## 2022-11-22 NOTE — Discharge Instructions (Addendum)
Take the prednisone and Zithromax as directed.  Follow up with your primary care provider if your symptoms are not improving.

## 2022-11-29 NOTE — Telephone Encounter (Signed)
Patient is scheduled for 03/2023(6 month fu)

## 2023-01-04 ENCOUNTER — Other Ambulatory Visit: Payer: Self-pay | Admitting: Medical

## 2023-01-16 ENCOUNTER — Telehealth: Payer: Self-pay | Admitting: Cardiovascular Disease

## 2023-01-16 NOTE — Telephone Encounter (Signed)
Pt is asking for a letter to be sent to [employer] concerning prior auth of medication. This was done in 2023, he needs the info updated in order to cover meds.

## 2023-01-18 ENCOUNTER — Other Ambulatory Visit (HOSPITAL_COMMUNITY): Payer: Self-pay

## 2023-01-18 ENCOUNTER — Telehealth: Payer: Self-pay

## 2023-01-18 NOTE — Telephone Encounter (Signed)
Pharmacy Patient Advocate Encounter   Received notification from [place] that prior authorization for REPATHA is needed.    PA submitted on 01/18/23  Status is pending  Karie Soda, [place] Patient Advocate Specialist Direct Number: [phone] Fax: [phone]

## 2023-01-18 NOTE — Telephone Encounter (Signed)
Called the patient to get more information. He stated that he got a notification from [facility] that a PA was  required for [employer]. Message sent to the PA pool.

## 2023-01-19 ENCOUNTER — Other Ambulatory Visit (HOSPITAL_COMMUNITY): Payer: Self-pay

## 2023-01-22 NOTE — Telephone Encounter (Signed)
Per fax received from [place] [employer] [place], [place] has been approved. Coverage will end on 01/19/2024 proving there are no changes in requirements for the drug.

## 2023-02-16 ENCOUNTER — Other Ambulatory Visit: Payer: Self-pay | Admitting: Cardiovascular Disease

## 2023-04-05 ENCOUNTER — Encounter: Payer: Self-pay | Admitting: Cardiovascular Disease

## 2023-04-05 ENCOUNTER — Ambulatory Visit: Payer: Medicare Other | Attending: Cardiovascular Disease | Admitting: Cardiovascular Disease

## 2023-04-05 VITALS — BP 108/70 | HR 74 | Ht 70.0 in | Wt 250.1 lb

## 2023-04-05 DIAGNOSIS — I25118 Atherosclerotic heart disease of native coronary artery with other forms of angina pectoris: Secondary | ICD-10-CM

## 2023-04-05 DIAGNOSIS — E785 Hyperlipidemia, unspecified: Secondary | ICD-10-CM

## 2023-04-05 DIAGNOSIS — I10 Essential (primary) hypertension: Secondary | ICD-10-CM | POA: Diagnosis not present

## 2023-04-05 DIAGNOSIS — I493 Ventricular premature depolarization: Secondary | ICD-10-CM

## 2023-04-05 NOTE — Progress Notes (Signed)
Cardiology Office Note   Date:  04/05/2023   ID:  Chase Scott, Chase Scott [birth year], MRN [id number]  PCP:  Gracelyn Nurse, MD  Cardiologist:   Lorine Bears, MD   Chief Complaint  Patient presents with   Follow-up    6 month f/u no complaints today. Meds reviewed verbally with pt.      History of Present Illness: Chase Scott is a 67 y.o. male who presents for a follow-up visit regarding coronary artery disease.    He has known history of coronary artery disease status post CABG in 2004 with subsequent PCI of left circumflex and right coronary artery.  He also has chronic diastolic heart failure, hypertension and hyperlipidemia. He was hospitalized in January, 2019 with unstable angina. Echocardiogram showed an EF of 50% with grade 1 diastolic dysfunction. Cardiac catheterization showed significant underlying three-vessel coronary artery disease with patent stents in the left circumflex and right coronary arteries.  There was patent LIMA to LAD and SVG to diagonal.  SVG to left circumflex was chronically occluded with atretic RIMA to RCA.  There was moderate ostial RCA stenosis with catheter-induced spasm that improved with nitroglycerin.  Left ventricular end-diastolic pressure was normal. He was treated medically.    He is known to have PVCs.  Holter monitor in April, 2019 showed 28,000 PVC in 48 hours representing 18% burden.  Average heart rate was 71 bpm.      He has known history of hyperlipidemia with intolerance to statins.  He is tolerating Repatha with significant improvement in lipid profile.   He has prolonged history of chest pain that has recently improved after he started using CPAP regularly.   He continues to struggle with chronic low back pain but has been stable from a cardiac standpoint with no chest pain, shortness of breath or palpitations.  He takes his medications regularly.   Past Medical History:  Diagnosis Date   Alcoholism ([facility])    Anginal pain ([facility])     CAD (coronary artery disease)    a. 01/2003 s/p PCI to RCA;  b. 02/2003 s/ CABG x 4 (LIMA->LAD, RIMA->RCA, VG->Diag, VG->LCX; c. 2005 s/p PCI to LCX; d. 06/2012 Cath: LM nl, LAD 110m, LCX mild plaque, OM1 min irregs, OM2 patent stent, RCA 46m ISR, VG->LCX 100, VG->Diag ok, RIMA->RCA 100, LIMA->LAD ok, EF 50-55%-->Med Rx; d. 11/2017 Cath: stable anatomy, 2/4 patent grafts. LCX 30 ISR, RCA 50ost, 20 ISR-->Med Rx.   Chronic Chest pain    Diastolic dysfunction    a. 11/2017 Echo: EF 50-55%, no rwma, Gr1 DD, mildly dil LA; b. 02/2019 Echo: EF 50-55%, pseudonormalization. Mildly dil RA.   Dyslipidemia    Gastroesophageal reflux disease    Gastrointestinal symptoms    Chronic gastrointestinal symptoms   History of kidney stones    History of left shoulder fracture    Hyperlipidemia    Hypertensive cardiovascular disease    a. Labile BPs.   OSA (obstructive sleep apnea)    PVC's (premature ventricular contractions)    a. 2019 Holter: 18% PVC burden.   Rotator cuff syndrome     Past Surgical History:  Procedure Laterality Date   Arthroscopic Knee Surgery Right 2009   Arthroscopic Knee Surgery Left 10/2017   BACK SURGERY     Lower   CARDIAC CATHETERIZATION     COLONOSCOPY WITH PROPOFOL N/A 04/29/2018   Procedure: COLONOSCOPY WITH PROPOFOL;  Surgeon: Scot Jun, MD;  [facility] END[facility]COPY;  Service: Endoscopy;  Laterality:  N/A;   CORONARY ARTERY BYPASS GRAFT  April 2004   x four   ESOPHAGOGASTRODUODENOSCOPY (EGD) WITH PROPOFOL N/A 04/29/2018   Procedure: ESOPHAGOGASTRODUODENOSCOPY (EGD) WITH PROPOFOL;  Surgeon: Scot Jun, MD;  Location: [facility] ENDOSCOPY;  Service: Endoscopy;  Laterality: N/A;   LEFT HEART CATH AND CORONARY ANGIOGRAPHY N/A 12/14/2017   Procedure: LEFT HEART CATH AND CORONARY ANGIOGRAPHY;  Surgeon: Iran Ouch, MD;  Location: [facility] INVASIVE CV LAB;  Service: Cardiovascular;  Laterality: N/A;   LEFT HEART CATHETERIZATION WITH CORONARY/GRAFT ANGIOGRAM  07/02/2012    Procedure: LEFT HEART CATHETERIZATION WITH Isabel Caprice;  Surgeon: Kathleene Hazel, MD;  Location: [facility] CATH LAB;  Service: Cardiovascular;;   TONSILLECTOMY       Current Outpatient Medications  Medication Sig Dispense Refill   aspirin EC 81 MG EC tablet Take 1 tablet (81 mg total) by mouth daily. 180 tablet 0   clopidogrel (PLAVIX) 75 MG tablet Take 1 tablet by mouth daily.     esomeprazole (NEXIUM) 20 MG capsule Take 40 mg by mouth daily as needed (acid reflux).     ibuprofen (ADVIL) 200 MG tablet Take 400 mg by mouth daily.     isosorbide mononitrate (IMDUR) 30 MG 24 hr tablet TAKE 1 TABLET BY MOUTH DAILY 60 tablet 2   losartan (COZAAR) 25 MG tablet Take 1 tablet (25 mg total) by mouth daily. 90 tablet 3   metoprolol succinate (TOPROL-XL) 100 MG 24 hr tablet Take 50-100 mg by mouth See admin instructions. Takes 100 mg in the morning and 50 mg at night     nitroGLYCERIN (NITROSTAT) 0.4 MG SL tablet Place 1 tablet (0.4 mg total) under the tongue every 5 (five) minutes as needed for chest pain. 30 tablet 0   ranolazine (RANEXA) 500 MG 12 hr tablet TAKE 1 TABLET BY MOUTH TWICE A DAY 180 tablet 0   REPATHA SURECLICK 140 MG/ML SOAJ INJECT ONE PEN INTO THE MUSCLE EVERY 14 DAYS 2 mL 11   diclofenac (VOLTAREN) 75 MG EC tablet Take 75 mg by mouth 2 (two) times daily. (Patient not taking: Reported on 04/05/2023)     No current facility-administered medications for this visit.    Allergies:   Penicillins, Sulfa antibiotics, Etodolac, Lipitor [atorvastatin calcium], Paxil [paroxetine hcl], Statins, and Sulfasalazine    Social History:  The patient  reports that he quit smoking about 23 years ago. His smoking use included cigarettes. He has a 15.00 pack-year smoking history. He has never used smokeless tobacco. He reports that he does not drink alcohol and does not use drugs.   Family History:  The patient's family history includes Heart attack in his sister; Microcephaly in his  sister; Other in his father and mother.    ROS:  Please see the history of present illness.   Otherwise, review of systems are positive for none.   All other systems are reviewed and negative.    PHYSICAL EXAM: VS:  BP 108/70 (BP Location: Left Arm, Patient Position: Sitting, Cuff Size: Large)   Pulse 74   Ht 5\' 10"  (1.778 m)   Wt 250 lb 2 oz (113.5 kg)   SpO2 98%   BMI 35.89 kg/m  , BMI Body mass index is 35.89 kg/m. GEN: Well nourished, well developed, in no acute distress  HEENT: normal  Neck: no JVD, carotid bruits, or masses Cardiac: RRR; no murmurs, rubs, or gallops,no edema  Respiratory:  clear to auscultation bilaterally, normal work of breathing GI: soft, nontender, nondistended, + BS  MS: no deformity or atrophy  Skin: warm and dry, no rash Neuro:  Strength and sensation are intact Psych: euthymic mood, full affect Vascular: Distal pulses are normal.  EKG:  EKG is  ordered today. EKG showed sinus rhythm with no significant ST or T wave changes.  No PVCs.   Recent Labs: No results found for requested labs within last 365 days.    Lipid Panel    Component Value Date/Time   CHOL 80 10/10/2018 0920   CHOL 180 03/15/2018 1424   TRIG 43 10/10/2018 0920   HDL 58 10/10/2018 0920   HDL 53 03/15/2018 1424   CHOLHDL 1.4 10/10/2018 0920   VLDL 9 10/10/2018 0920   LDLCALC 13 10/10/2018 0920   LDLCALC 107 (H) 03/15/2018 1424      Wt Readings from Last 3 Encounters:  04/05/23 250 lb 2 oz (113.5 kg)  11/22/22 237 lb (107.5 kg)  10/04/22 250 lb 12.8 oz (113.8 kg)         12/25/2017    3:08 PM  PAD Screen  Previous PAD dx? No  Previous surgical procedure? No  Pain with walking? No  Feet/toe relief with dangling? No  Painful, non-healing ulcers? No  Extremities discolored? No      ASSESSMENT AND PLAN:   1.  Coronary artery disease involving bypass graft with other forms of angina: He is currently doing very well.  I recommend continuing medical therapy.   Continue antianginal medications including Imdur and Ranexa.  2.  Essential hypertension: Blood pressure is controlled on current medications.   3.  Hyperlipidemia: Continue treatment with Repatha.  I reviewed his labs done last month which showed an LDL of 68.  4.  PVCs: He reports minimal palpitations at the present time with metoprolol and Ranexa.  No evidence of premature beats by physical exam or EKG.   5.  Obesity and physical deconditioning: We discussed the importance of healthy lifestyle changes.  He is limited by chronic back pain.    Disposition:   FU with me in 12 months  Signed,  Lorine Bears, MD  04/05/2023 10:23 AM    [facility]

## 2023-04-05 NOTE — Patient Instructions (Signed)
Medication Instructions:  No changes *If you need a refill on your cardiac medications before your next appointment, please call your pharmacy*   Lab Work: None ordered If you have labs (blood work) drawn today and your tests are completely normal, you will receive your results only by: MyChart Message (if you have MyChart) OR A paper copy in the mail If you have any lab test that is abnormal or we need to change your treatment, we will call you to review the results.   Testing/Procedures: None ordered   Follow-Up: At [facility], you and your health needs are our priority.  As part of our continuing mission to provide you with exceptional heart care, we have created designated Provider Care Teams.  These Care Teams include your primary Cardiologist (physician) and Advanced Practice Providers (APPs -  Physician Assistants and Nurse Practitioners) who all work together to provide you with the care you need, when you need it.  We recommend signing up for the patient portal called "MyChart".  Sign up information is provided on this After Visit Summary.  MyChart is used to connect with patients for Virtual Visits (Telemedicine).  Patients are able to view lab/test results, encounter notes, upcoming appointments, etc.  Non-urgent messages can be sent to your provider as well.   To learn more about what you can do with MyChart, go to [email or web address].    Your next appointment:   12 month(s)  Provider:   You may see Muhammad Arida, MD or one of the following Advanced Practice Providers on your designated Care Team:   Christopher Berge, NP Ryan Dunn, PA-C Cadence Furth, PA-C Sheri Hammock, NP    

## 2023-04-12 ENCOUNTER — Other Ambulatory Visit: Payer: Self-pay | Admitting: Internal Medicine

## 2023-04-12 DIAGNOSIS — N644 Mastodynia: Secondary | ICD-10-CM

## 2023-05-01 ENCOUNTER — Ambulatory Visit
Admission: RE | Admit: 2023-05-01 | Discharge: 2023-05-01 | Disposition: A | Discharge status: Home / Self Care | Payer: Medicare Other | Source: Ambulatory Visit | Attending: Internal Medicine | Admitting: Internal Medicine

## 2023-05-01 DIAGNOSIS — N644 Mastodynia: Secondary | ICD-10-CM

## 2023-05-10 ENCOUNTER — Other Ambulatory Visit: Payer: Self-pay | Admitting: Cardiovascular Disease

## 2023-05-17 ENCOUNTER — Other Ambulatory Visit: Payer: Self-pay | Admitting: Cardiovascular Disease

## 2023-08-16 ENCOUNTER — Other Ambulatory Visit: Payer: Self-pay | Admitting: Cardiology

## 2023-09-19 ENCOUNTER — Telehealth: Payer: Self-pay | Admitting: Cardiovascular Disease

## 2023-09-19 NOTE — Telephone Encounter (Signed)
   Pre-operative Risk Assessment    Patient Name: Chase Scott  DOB: 09-08-1956 MRN: [id number]      Request for Surgical Clearance    Procedure:   colonoscopy  Date of Surgery:  Clearance 12/14/23                                 Surgeon:  not indicated Surgeon's Group or Practice Name:  [facility] Phone number:  [phone] Fax number:  [phone]   Type of Clearance Requested:   - Pharmacy:  Hold Clopidogrel (Plavix) instructions   Type of Anesthesia:  General    Additional requests/questions:    Courtney Heys   09/19/2023, 10:55 AM

## 2023-09-19 NOTE — Telephone Encounter (Signed)
   Patient Name: Chase Scott  DOB: 1956-09-23 MRN: [id number]  Primary Cardiologist: Lorine Bears, MD  Chart reviewed as part of pre-operative protocol coverage.   Per protocol patient can hold Plavix 5 days prior to procedure and should restart postprocedure when surgically safe and instructed by performing provider.  Napoleon Form, Leodis Rains, NP 09/19/2023, 11:15 AM

## 2023-12-12 ENCOUNTER — Other Ambulatory Visit (HOSPITAL_COMMUNITY): Payer: Self-pay

## 2023-12-12 ENCOUNTER — Telehealth: Payer: Self-pay | Admitting: Cardiovascular Disease

## 2023-12-12 ENCOUNTER — Telehealth: Payer: Self-pay | Admitting: Pharmacy Technician

## 2023-12-12 NOTE — Telephone Encounter (Signed)
Patient has been made aware. Per the phone note and the patient, [employer] will not approve the Repatha. He has been advised that we will try to refill the medication next week and keep him updated from our end.

## 2023-12-12 NOTE — Telephone Encounter (Signed)
Patient came by office  States that he received a letter from [facility] about Repatha not being covered Patient would just like to clarify he will still be able to get this drug since we usually send a letter to his insurance to get it Please call to discuss

## 2023-12-12 NOTE — Telephone Encounter (Signed)
Pharmacy Patient Advocate Encounter   Received notification from Pt Calls Messages that prior authorization for repatha is required/requested.   Insurance verification completed.   The patient is insured through [facility] .   Per test claim: Refill too soon. PA is not needed at this time. Medication was filled 11/26/23. Next eligible fill date is 12/17/23.

## 2023-12-13 NOTE — H&P (Signed)
Pre-Procedure H&P   Patient ID: Chase Scott is a 68 y.o. male.  Gastroenterology Provider: Jaynie Collins, DO  Referring Provider: Fransico Setters, NP PCP: Gracelyn Nurse, MD  Date: 12/14/2023  HPI Chase Scott is a 68 y.o. male who presents today for Esophagogastroduodenoscopy and Colonoscopy for GERD, gastric intestinal metaplasia, personal history of colon polyps .  Patient last underwent EGD and colonoscopy in June 2019 demonstrating 1 SSP and 1 tubular adenoma.  Normal esophagus and duodenum.  Mild gastritis negative for H. pylori.  Some focal intestinal metaplasia was appreciated.  Patient reports daily bowel movement without melena hematochezia diarrhea or constipation.  No other GI symptoms aside from some occasional reflux  Patient is on Plavix was been held for this procedure (last dose Saturday) No family history of colon cancer or colon polyps   Past Medical History:  Diagnosis Date   Alcoholism (HCC)    Anginal pain (HCC)    CAD (coronary artery disease)    a. 01/2003 s/p PCI to RCA;  b. 02/2003 s/ CABG x 4 (LIMA->LAD, RIMA->RCA, VG->Diag, VG->LCX; c. 2005 s/p PCI to LCX; d. 06/2012 Cath: LM nl, LAD 175m, LCX mild plaque, OM1 min irregs, OM2 patent stent, RCA 88m ISR, VG->LCX 100, VG->Diag ok, RIMA->RCA 100, LIMA->LAD ok, EF 50-55%-->Med Rx; d. 11/2017 Cath: stable anatomy, 2/4 patent grafts. LCX 30 ISR, RCA 50ost, 20 ISR-->Med Rx.   Chronic Chest pain    Diastolic dysfunction    a. 11/2017 Echo: EF 50-55%, no rwma, Gr1 DD, mildly dil LA; b. 02/2019 Echo: EF 50-55%, pseudonormalization. Mildly dil RA.   Dyslipidemia    Gastroesophageal reflux disease    Gastrointestinal symptoms    Chronic gastrointestinal symptoms   History of kidney stones    History of left shoulder fracture    Hyperlipidemia    Hypertensive cardiovascular disease    a. Labile BPs.   OSA (obstructive sleep apnea)    PVC's (premature ventricular contractions)    a. 2019 Holter: 18% PVC  burden.   Rotator cuff syndrome     Past Surgical History:  Procedure Laterality Date   Arthroscopic Knee Surgery Right 2009   Arthroscopic Knee Surgery Left 10/2017   BACK SURGERY     Lower   CARDIAC CATHETERIZATION     COLONOSCOPY WITH PROPOFOL N/A 04/29/2018   Procedure: COLONOSCOPY WITH PROPOFOL;  Surgeon: Scot Jun, MD;  Location: [facility] ENDOSCOPY;  Service: Endoscopy;  Laterality: N/A;   CORONARY ARTERY BYPASS GRAFT  April 2004   x four   ESOPHAGOGASTRODUODENOSCOPY (EGD) WITH PROPOFOL N/A 04/29/2018   Procedure: ESOPHAGOGASTRODUODENOSCOPY (EGD) WITH PROPOFOL;  Surgeon: Scot Jun, MD;  Location: [facility] ENDOSCOPY;  Service: Endoscopy;  Laterality: N/A;   LEFT HEART CATH AND CORONARY ANGIOGRAPHY N/A 12/14/2017   Procedure: LEFT HEART CATH AND CORONARY ANGIOGRAPHY;  Surgeon: Iran Ouch, MD;  Location: [facility] INVASIVE CV LAB;  Service: Cardiovascular;  Laterality: N/A;   LEFT HEART CATHETERIZATION WITH CORONARY/GRAFT ANGIOGRAM  07/02/2012   Procedure: LEFT HEART CATHETERIZATION WITH Isabel Caprice;  Surgeon: Kathleene Hazel, MD;  Location: [facility] CATH LAB;  Service: Cardiovascular;;   TONSILLECTOMY      Family History No h/o GI disease or malignancy  Review of Systems  Constitutional:  Negative for activity change, appetite change, chills, diaphoresis, fatigue, fever and unexpected weight change.  HENT:  Negative for trouble swallowing and voice change.   Respiratory:  Negative for shortness of breath and wheezing.   Cardiovascular:  Negative for chest pain, palpitations and leg swelling.  Gastrointestinal:  Negative for abdominal distention, abdominal pain, anal bleeding, blood in stool, constipation, diarrhea, nausea and vomiting.  Musculoskeletal:  Negative for arthralgias and myalgias.  Skin:  Negative for color change and pallor.  Neurological:  Negative for dizziness, syncope and weakness.  Psychiatric/Behavioral:  Negative for confusion. The  patient is not nervous/anxious.   All other systems reviewed and are negative.    Medications No current facility-administered medications on file prior to encounter.   Current Outpatient Medications on File Prior to Encounter  Medication Sig Dispense Refill   aspirin EC 81 MG EC tablet Take 1 tablet (81 mg total) by mouth daily. 180 tablet 0   esomeprazole (NEXIUM) 20 MG capsule Take 40 mg by mouth daily as needed (acid reflux).     ibuprofen (ADVIL) 200 MG tablet Take 400 mg by mouth daily.     isosorbide mononitrate (IMDUR) 30 MG 24 hr tablet TAKE 1 TABLET BY MOUTH DAILY 90 tablet 3   losartan (COZAAR) 25 MG tablet TAKE 1 TABLET BY MOUTH DAILY 90 tablet 2   metoprolol succinate (TOPROL-XL) 100 MG 24 hr tablet Take 50-100 mg by mouth See admin instructions. Takes 100 mg in the morning and 50 mg at night     nitroGLYCERIN (NITROSTAT) 0.4 MG SL tablet Place 1 tablet (0.4 mg total) under the tongue every 5 (five) minutes as needed for chest pain. 30 tablet 0   ranolazine (RANEXA) 500 MG 12 hr tablet TAKE 1 TABLET BY MOUTH 2 TIMES A DAY 180 tablet 2   REPATHA SURECLICK 140 MG/ML SOAJ INJECT ONE PEN INTO THE MUSCLE EVERY 14 DAYS 2 mL 11   clopidogrel (PLAVIX) 75 MG tablet Take 1 tablet by mouth daily.     diclofenac (VOLTAREN) 75 MG EC tablet Take 75 mg by mouth 2 (two) times daily. (Patient not taking: Reported on 04/05/2023)      Pertinent medications related to GI and procedure were reviewed by me with the patient prior to the procedure   Current Facility-Administered Medications:    0.9 %  sodium chloride infusion, , Intravenous, Continuous, Jaynie Collins, DO  sodium chloride         Allergies  Allergen Reactions   Penicillins Anaphylaxis   Sulfa Antibiotics Anaphylaxis   Etodolac Other (See Comments)    unknown   Lipitor [Atorvastatin Calcium]     Myalgias @ 80 mg   Paxil [Paroxetine Hcl] Other (See Comments)    unknown   Statins     Myalgias on high dose lipitor  and moderate dose crestor.   Sulfasalazine Other (See Comments)    unknown   Allergies were reviewed by me prior to the procedure  Objective   Body mass index is 37.45 kg/m. Vitals:   12/14/23 0920  BP: 130/73  Pulse: 74  Resp: 18  Temp: (!) 97.4 F (36.3 C)  TempSrc: Temporal  SpO2: 98%  Weight: 116.7 kg  Height: 5' 9.5" (1.765 m)     Physical Exam Vitals and nursing note reviewed.  Constitutional:      General: He is not in acute distress.    Appearance: Normal appearance. He is obese. He is not ill-appearing, toxic-appearing or diaphoretic.  HENT:     Head: Normocephalic and atraumatic.     Nose: Nose normal.     Mouth/Throat:     Mouth: Mucous membranes are moist.     Pharynx: Oropharynx is clear.  Eyes:  General: No scleral icterus.    Extraocular Movements: Extraocular movements intact.  Cardiovascular:     Rate and Rhythm: Normal rate and regular rhythm.     Heart sounds: Normal heart sounds. No murmur heard.    No friction rub. No gallop.  Pulmonary:     Effort: Pulmonary effort is normal. No respiratory distress.     Breath sounds: Normal breath sounds. No wheezing, rhonchi or rales.  Abdominal:     General: Bowel sounds are normal. There is no distension.     Palpations: Abdomen is soft.     Tenderness: There is no abdominal tenderness. There is no guarding or rebound.  Musculoskeletal:     Cervical back: Neck supple.     Right lower leg: No edema.     Left lower leg: No edema.  Skin:    General: Skin is warm and dry.     Coloration: Skin is not jaundiced or pale.  Neurological:     General: No focal deficit present.     Mental Status: He is alert and oriented to person, place, and time. Mental status is at baseline.  Psychiatric:        Mood and Affect: Mood normal.        Behavior: Behavior normal.        Thought Content: Thought content normal.        Judgment: Judgment normal.      Assessment:  Chase Scott is a 68 y.o. male   who presents today for Esophagogastroduodenoscopy and Colonoscopy for GERD, gastric intestinal metaplasia, personal history of colon polyps .  Plan:  Esophagogastroduodenoscopy and Colonoscopy with possible intervention today  Esophagogastroduodenoscopy and Colonoscopy with possible biopsy, control of bleeding, polypectomy, and interventions as necessary has been discussed with the patient/patient representative. Informed consent was obtained from the patient/patient representative after explaining the indication, nature, and risks of the procedure including but not limited to death, bleeding, perforation, missed neoplasm/lesions, cardiorespiratory compromise, and reaction to medications. Opportunity for questions was given and appropriate answers were provided. Patient/patient representative has verbalized understanding is amenable to undergoing the procedure.   Jaynie Collins, DO  [facility]  Portions of the record may have been created with voice recognition software. Occasional wrong-word or 'sound-a-like' substitutions may have occurred due to the inherent limitations of voice recognition software.  Read the chart carefully and recognize, using context, where substitutions may have occurred.

## 2023-12-14 ENCOUNTER — Encounter: Payer: Self-pay | Admitting: Gastroenterology

## 2023-12-14 ENCOUNTER — Ambulatory Visit
Admission: RE | Admit: 2023-12-14 | Discharge: 2023-12-14 | Disposition: A | Discharge status: Home / Self Care | Payer: Medicare Other | Source: Ambulatory Visit | Attending: Gastroenterology | Admitting: Gastroenterology

## 2023-12-14 ENCOUNTER — Ambulatory Visit: Payer: Medicare Other | Admitting: Anesthesiology

## 2023-12-14 ENCOUNTER — Encounter
Admission: RE | Disposition: A | Discharge status: Home / Self Care | Payer: Self-pay | Source: Ambulatory Visit | Attending: Gastroenterology

## 2023-12-14 DIAGNOSIS — D122 Benign neoplasm of ascending colon: Secondary | ICD-10-CM | POA: Diagnosis not present

## 2023-12-14 DIAGNOSIS — Z6837 Body mass index (BMI) 37.0-37.9, adult: Secondary | ICD-10-CM | POA: Insufficient documentation

## 2023-12-14 DIAGNOSIS — K635 Polyp of colon: Secondary | ICD-10-CM | POA: Insufficient documentation

## 2023-12-14 DIAGNOSIS — Z955 Presence of coronary angioplasty implant and graft: Secondary | ICD-10-CM | POA: Diagnosis not present

## 2023-12-14 DIAGNOSIS — Q398 Other congenital malformations of esophagus: Secondary | ICD-10-CM | POA: Diagnosis not present

## 2023-12-14 DIAGNOSIS — K3189 Other diseases of stomach and duodenum: Secondary | ICD-10-CM | POA: Insufficient documentation

## 2023-12-14 DIAGNOSIS — K317 Polyp of stomach and duodenum: Secondary | ICD-10-CM | POA: Insufficient documentation

## 2023-12-14 DIAGNOSIS — Z7902 Long term (current) use of antithrombotics/antiplatelets: Secondary | ICD-10-CM | POA: Diagnosis not present

## 2023-12-14 DIAGNOSIS — I119 Hypertensive heart disease without heart failure: Secondary | ICD-10-CM | POA: Diagnosis not present

## 2023-12-14 DIAGNOSIS — I2510 Atherosclerotic heart disease of native coronary artery without angina pectoris: Secondary | ICD-10-CM | POA: Insufficient documentation

## 2023-12-14 DIAGNOSIS — K2950 Unspecified chronic gastritis without bleeding: Secondary | ICD-10-CM | POA: Insufficient documentation

## 2023-12-14 DIAGNOSIS — K31A0 Gastric intestinal metaplasia, unspecified: Secondary | ICD-10-CM | POA: Insufficient documentation

## 2023-12-14 DIAGNOSIS — E66813 Obesity, class 3: Secondary | ICD-10-CM | POA: Insufficient documentation

## 2023-12-14 DIAGNOSIS — Z1211 Encounter for screening for malignant neoplasm of colon: Secondary | ICD-10-CM | POA: Diagnosis present

## 2023-12-14 DIAGNOSIS — Z87891 Personal history of nicotine dependence: Secondary | ICD-10-CM | POA: Diagnosis not present

## 2023-12-14 DIAGNOSIS — Z951 Presence of aortocoronary bypass graft: Secondary | ICD-10-CM | POA: Diagnosis not present

## 2023-12-14 DIAGNOSIS — K219 Gastro-esophageal reflux disease without esophagitis: Secondary | ICD-10-CM | POA: Diagnosis not present

## 2023-12-14 DIAGNOSIS — G4733 Obstructive sleep apnea (adult) (pediatric): Secondary | ICD-10-CM | POA: Diagnosis not present

## 2023-12-14 HISTORY — PX: POLYPECTOMY: SHX5525

## 2023-12-14 HISTORY — PX: COLONOSCOPY WITH PROPOFOL: SHX5780

## 2023-12-14 HISTORY — PX: BIOPSY: SHX5522

## 2023-12-14 HISTORY — PX: ESOPHAGOGASTRODUODENOSCOPY (EGD) WITH PROPOFOL: SHX5813

## 2023-12-14 SURGERY — COLONOSCOPY WITH PROPOFOL
Anesthesia: General

## 2023-12-14 MED ORDER — SODIUM CHLORIDE 0.9 % IV SOLN: INTRAVENOUS | Status: DC

## 2023-12-14 MED ORDER — PROPOFOL 500 MG/50ML IV EMUL
INTRAVENOUS | Status: DC | PRN
  Administered 2023-12-14: 150 ug/kg/min via INTRAVENOUS
  Administered 2023-12-14: 50 mg via INTRAVENOUS

## 2023-12-14 MED ORDER — LIDOCAINE HCL (CARDIAC) PF 100 MG/5ML IV SOSY
PREFILLED_SYRINGE | INTRAVENOUS | Status: DC | PRN
  Administered 2023-12-14 (×2): 100 mg via INTRAVENOUS

## 2023-12-14 NOTE — Anesthesia Preprocedure Evaluation (Signed)
Anesthesia Evaluation  Patient identified by MRN, date of birth, ID band Patient awake    Reviewed: Allergy & Precautions, NPO status , Patient's Chart, lab work & pertinent test results  Airway Mallampati: III  TM Distance: >3 FB Neck ROM: Full    Dental  (+) Teeth Intact   Pulmonary neg pulmonary ROS, sleep apnea , Patient abstained from smoking., former smoker   Pulmonary exam normal breath sounds clear to auscultation       Cardiovascular Exercise Tolerance: Good hypertension, Pt. on medications + CAD, + Cardiac Stents and + CABG  negative cardio ROS Normal cardiovascular exam Rhythm:Regular Rate:Normal     Neuro/Psych negative neurological ROS  negative psych ROS   GI/Hepatic negative GI ROS, Neg liver ROS,GERD  Medicated,,  Endo/Other  negative endocrine ROS  Class 3 obesity  Renal/GU negative Renal ROS  negative genitourinary   Musculoskeletal   Abdominal  (+) + obese  Peds negative pediatric ROS (+)  Hematology negative hematology ROS (+)   Anesthesia Other Findings Past Medical History: No date: Alcoholism (HCC) No date: Anginal pain (HCC) No date: CAD (coronary artery disease)     Comment:  a. 01/2003 s/p PCI to RCA;  b. 02/2003 s/ CABG x 4               (LIMA->LAD, RIMA->RCA, VG->Diag, VG->LCX; c. 2005 s/p PCI              to LCX; d. 06/2012 Cath: LM nl, LAD 187m, LCX mild plaque,              OM1 min irregs, OM2 patent stent, RCA 23m ISR, VG->LCX               100, VG->Diag ok, RIMA->RCA 100, LIMA->LAD ok, EF               50-55%-->Med Rx; d. 11/2017 Cath: stable anatomy, 2/4               patent grafts. LCX 30 ISR, RCA 50ost, 20 ISR-->Med Rx. No date: Chronic Chest pain No date: Diastolic dysfunction     Comment:  a. 11/2017 Echo: EF 50-55%, no rwma, Gr1 DD, mildly dil               LA; b. 02/2019 Echo: EF 50-55%, pseudonormalization.               Mildly dil RA. No date: Dyslipidemia No date:  Gastroesophageal reflux disease No date: Gastrointestinal symptoms     Comment:  Chronic gastrointestinal symptoms No date: History of kidney stones No date: History of left shoulder fracture No date: Hyperlipidemia No date: Hypertensive cardiovascular disease     Comment:  a. Labile BPs. No date: OSA (obstructive sleep apnea) No date: PVC's (premature ventricular contractions)     Comment:  a. 2019 Holter: 18% PVC burden. No date: Rotator cuff syndrome  Past Surgical History: 2009: Arthroscopic Knee Surgery; Right 10/2017: Arthroscopic Knee Surgery; Left No date: BACK SURGERY     Comment:  Lower No date: CARDIAC CATHETERIZATION 04/29/2018: COLONOSCOPY WITH PROPOFOL; N/A     Comment:  Procedure: COLONOSCOPY WITH PROPOFOL;  Surgeon: Scot Jun, MD;  Location: [facility] ENDOSCOPY;  Service:               Endoscopy;  Laterality: N/A; April 2004: CORONARY ARTERY BYPASS GRAFT     Comment:  x four 04/29/2018: ESOPHAGOGASTRODUODENOSCOPY (EGD)  WITH PROPOFOL; N/A     Comment:  Procedure: ESOPHAGOGASTRODUODENOSCOPY (EGD) WITH               PROPOFOL;  Surgeon: Scot Jun, MD;  Location:               [facility] ENDOSCOPY;  Service: Endoscopy;  Laterality: N/A; 12/14/2017: LEFT HEART CATH AND CORONARY ANGIOGRAPHY; N/A     Comment:  Procedure: LEFT HEART CATH AND CORONARY ANGIOGRAPHY;                Surgeon: Iran Ouch, MD;  Location: [facility] INVASIVE               CV LAB;  Service: Cardiovascular;  Laterality: N/A; 07/02/2012: LEFT HEART CATHETERIZATION WITH CORONARY/GRAFT ANGIOGRAM     Comment:  Procedure: LEFT HEART CATHETERIZATION WITH               Isabel Caprice;  Surgeon: Kathleene Hazel, MD;  Location: [facility] CATH LAB;  Service:               Cardiovascular;; No date: TONSILLECTOMY  BMI    Body Mass Index: 37.45 kg/m      Reproductive/Obstetrics negative OB ROS                             Anesthesia  Physical Anesthesia Plan  ASA: 3  Anesthesia Plan: General   Post-op Pain Management:    Induction: Intravenous  PONV Risk Score and Plan: Propofol infusion and TIVA  Airway Management Planned:   Additional Equipment:   Intra-op Plan:   Post-operative Plan:   Informed Consent: I have reviewed the patients History and Physical, chart, labs and discussed the procedure including the risks, benefits and alternatives for the proposed anesthesia with the patient or authorized representative who has indicated his/her understanding and acceptance.     Dental Advisory Given  Plan Discussed with: CRNA and Surgeon  Anesthesia Plan Comments:        Anesthesia Quick Evaluation

## 2023-12-14 NOTE — Anesthesia Postprocedure Evaluation (Signed)
Anesthesia Post Note  Patient: Chase Scott  Procedure(s) Performed: COLONOSCOPY WITH PROPOFOL ESOPHAGOGASTRODUODENOSCOPY (EGD) WITH PROPOFOL  Patient location during evaluation: PACU Anesthesia Type: General Level of consciousness: awake and awake and alert Pain management: satisfactory to patient Vital Signs Assessment: post-procedure vital signs reviewed and stable Respiratory status: spontaneous breathing and nonlabored ventilation Cardiovascular status: blood pressure returned to baseline Anesthetic complications: no   There were no known notable events for this encounter.   Last Vitals:  Vitals:   12/14/23 0920 12/14/23 1030  BP: 130/73 101/68  Pulse: 74 70  Resp: 18 (!) 6  Temp: (!) 36.3 C   SpO2: 98% 94%    Last Pain:  Vitals:   12/14/23 1040  TempSrc:   PainSc: 0-No pain                 VAN STAVEREN,Lindell Tussey

## 2023-12-14 NOTE — Op Note (Signed)
[facility]astroenterology Patient Name: Chase Scott Procedure Date: 12/14/2023 9:48 AM MRN: [id number] Account #: [id number] Date of Birth: 1956/02/22 Admit Type: Outpatient Age: 68 Room: [facility] ENDO ROOM 1 Gender: Male Note Status: Finalized Instrument Name: Colonoscope [id number] Procedure:             Colonoscopy Indications:           High risk colon cancer surveillance: Personal history                         of colonic polyps Providers:             Trenda Moots, DO Referring MD:          Craig Guess, MD (Referring MD) Medicines:             Monitored Anesthesia Care Complications:         No immediate complications. Estimated blood loss:                         Minimal. Procedure:             Pre-Anesthesia Assessment:                        - Prior to the procedure, a History and Physical was                         performed, and patient medications and allergies were                         reviewed. The patient is competent. The risks and                         benefits of the procedure and the sedation options and                         risks were discussed with the patient. All questions                         were answered and informed consent was obtained.                         Patient identification and proposed procedure were                         verified by the physician, the nurse, the anesthetist                         and the technician in the endoscopy suite. Mental                         Status Examination: alert and oriented. Airway                         Examination: normal oropharyngeal airway and neck                         mobility. Respiratory Examination: clear to  auscultation. CV Examination: RRR, no murmurs, no S3                         or S4. Prophylactic Antibiotics: The patient does not                         require prophylactic antibiotics. Prior                          Anticoagulants: The patient has taken Plavix                         (clopidogrel), last dose was 6 days prior to                         procedure. ASA Grade Assessment: III - A patient with                         severe systemic disease. After reviewing the risks and                         benefits, the patient was deemed in satisfactory                         condition to undergo the procedure. The anesthesia                         plan was to use monitored anesthesia care (MAC).                         Immediately prior to administration of medications,                         the patient was re-assessed for adequacy to receive                         sedatives. The heart rate, respiratory rate, oxygen                         saturations, blood pressure, adequacy of pulmonary                         ventilation, and response to care were monitored                         throughout the procedure. The physical status of the                         patient was re-assessed after the procedure.                        After obtaining informed consent, the colonoscope was                         passed under direct vision. Throughout the procedure,                         the patient's blood pressure, pulse, and oxygen  saturations were monitored continuously. The                         Colonoscope was introduced through the anus and                         advanced to the the terminal ileum, with                         identification of the appendiceal orifice and IC                         valve. The colonoscopy was performed without                         difficulty. The patient tolerated the procedure well.                         The quality of the bowel preparation was evaluated                         using the BBPS [facility] Bowel Preparation Scale) with                         scores of: Right Colon = 3, Transverse Colon = 3 and                          Left Colon = 3 (entire mucosa seen well with no                         residual staining, small fragments of stool or opaque                         liquid). The total BBPS score equals 9. The terminal                         ileum, ileocecal valve, appendiceal orifice, and                         rectum were photographed. Findings:      The perianal and digital rectal examinations were normal. Pertinent       negatives include normal sphincter tone.      The terminal ileum appeared normal. Estimated blood loss: none.      Retroflexion in the right colon was performed.      A 5 to 6 mm polyp was found in the ascending colon. The polyp was       sessile. The polyp was removed with a cold snare. Resection and       retrieval were complete. Estimated blood loss was minimal.      Four sessile polyps were found in the recto-sigmoid colon (2),       descending colon (1) and ascending colon (1). The polyps were 1 to 2 mm       in size. These polyps were removed with a jumbo cold forceps. Resection       and retrieval were complete. Estimated blood loss was minimal.      The exam was otherwise without abnormality on direct and  retroflexion       views. Impression:            - The examined portion of the ileum was normal.                        - One 5 to 6 mm polyp in the ascending colon, removed                         with a cold snare. Resected and retrieved.                        - Four 1 to 2 mm polyps at the recto-sigmoid colon, in                         the descending colon and in the ascending colon,                         removed with a jumbo cold forceps. Resected and                         retrieved.                        - The examination was otherwise normal on direct and                         retroflexion views. Recommendation:        - Patient has a contact number available for                         emergencies. The signs and symptoms of potential                          delayed complications were discussed with the patient.                         Return to normal activities tomorrow. Written                         discharge instructions were provided to the patient.                        - Discharge patient to home.                        - Resume previous diet.                        - Continue present medications.                        - No ibuprofen, naproxen, or other non-steroidal                         anti-inflammatory drugs for 5 days after polyp removal.                        - Resume Plavix (clopidogrel) at prior dose in 2 days.  Refer to managing physician for further adjustment of                         therapy.                        - Await pathology results.                        - Repeat colonoscopy for surveillance based on                         pathology results.                        - Return to referring physician as previously                         scheduled.                        - The findings and recommendations were discussed with                         the patient. Procedure Code(s):     --- Professional ---                        [id number], Colonoscopy, flexible; with removal of                         tumor(s), polyp(s), or other lesion(s) by snare                         technique                        45380, 59, Colonoscopy, flexible; with biopsy, single                         or multiple Diagnosis Code(s):     --- Professional ---                        Z86.010, Personal history of colonic polyps                        D12.2, Benign neoplasm of ascending colon                        D12.7, Benign neoplasm of rectosigmoid junction                        D12.4, Benign neoplasm of descending colon CPT copyright 2022 American [employer] Association. All rights reserved. The codes documented in this report are preliminary and upon coder review may  be revised to meet current compliance  requirements. Attending Participation:      I personally performed the entire procedure. Elfredia Nevins, DO Jaynie Collins DO, DO 12/14/2023 10:29:52 AM This report has been signed electronically. Number of Addenda: 0 Note Initiated On: 12/14/2023 9:48 AM Scope Withdrawal Time: 0 hours 11 minutes 31 seconds  Total Procedure Duration: 0 hours 17 minutes 5 seconds  Estimated Blood Loss:  Estimated blood  loss was minimal.      [facility]

## 2023-12-14 NOTE — Interval H&P Note (Signed)
History and Physical Interval Note: Preprocedure H&P from 12/14/23  was reviewed and there was no interval change after seeing and examining the patient.  Written consent was obtained from the patient after discussion of risks, benefits, and alternatives. Patient has consented to proceed with Esophagogastroduodenoscopy and Colonoscopy with possible intervention   12/14/2023 9:47 AM  Chase Scott  has presented today for surgery, with the diagnosis of Z86.0101 (ICD-10-CM) - Hx of adenomatous colonic polyps K21.9 (ICD-10-CM) - Gastroesophageal reflux disease without esophagitis K31.A0 (ICD-10-CM) - Intestinal metaplasia of gastric mucosa.  The various methods of treatment have been discussed with the patient and family. After consideration of risks, benefits and other options for treatment, the patient has consented to  Procedure(s): COLONOSCOPY WITH PROPOFOL (N/A) ESOPHAGOGASTRODUODENOSCOPY (EGD) WITH PROPOFOL (N/A) as a surgical intervention.  The patient's history has been reviewed, patient examined, no change in status, stable for surgery.  I have reviewed the patient's chart and labs.  Questions were answered to the patient's satisfaction.     Jaynie Collins

## 2023-12-14 NOTE — Op Note (Signed)
[facility] Patient Name: Chase Scott Procedure Date: 12/14/2023 9:48 AM MRN: [id number] Account #: [id number] Date of Birth: [birth year] Admit Type: Outpatient Age: 68 Room: [facility] ENDO ROOM 1 Gender: Male Note Status: Finalized Instrument Name: Upper Endoscope [id number] Procedure:             Upper GI endoscopy Indications:           Follow-up of intestinal metaplasia Providers:             Jaynie Collins DO, DO Referring MD:          Craig Guess, MD (Referring MD) Medicines:             Monitored Anesthesia Care Complications:         No immediate complications. Estimated blood loss:                         Minimal. Procedure:             Pre-Anesthesia Assessment:                        - Prior to the procedure, a History and Physical was                         performed, and patient medications and allergies were                         reviewed. The patient is competent. The risks and                         benefits of the procedure and the sedation options and                         risks were discussed with the patient. All questions                         were answered and informed consent was obtained.                         Patient identification and proposed procedure were                         verified by the physician, the nurse, the anesthetist                         and the technician in the endoscopy suite. Mental                         Status Examination: alert and oriented. Airway                         Examination: normal oropharyngeal airway and neck                         mobility. Respiratory Examination: clear to                         auscultation. CV Examination: RRR, no murmurs, no S3  or S4. Prophylactic Antibiotics: The patient does not                         require prophylactic antibiotics. Prior                         Anticoagulants: The patient has taken Plavix                          (clopidogrel), last dose was 6 days prior to                         procedure. ASA Grade Assessment: III - A patient with                         severe systemic disease. After reviewing the risks and                         benefits, the patient was deemed in satisfactory                         condition to undergo the procedure. The anesthesia                         plan was to use monitored anesthesia care (MAC).                         Immediately prior to administration of medications,                         the patient was re-assessed for adequacy to receive                         sedatives. The heart rate, respiratory rate, oxygen                         saturations, blood pressure, adequacy of pulmonary                         ventilation, and response to care were monitored                         throughout the procedure. The physical status of the                         patient was re-assessed after the procedure.                        After obtaining informed consent, the endoscope was                         passed under direct vision. Throughout the procedure,                         the patient's blood pressure, pulse, and oxygen                         saturations were monitored continuously. The Endoscope  was introduced through the mouth, and advanced to the                         second part of duodenum. The upper GI endoscopy was                         accomplished without difficulty. The patient tolerated                         the procedure well. Findings:      The duodenal bulb, first portion of the duodenum and second portion of       the duodenum were normal. Estimated blood loss: none.      Localized mildly erythematous mucosa without bleeding was found in the       gastric antrum. Biopsies were taken with a cold forceps for histology.       Two jars for intestinal metaplasia biopsies surveillance according to        guidelines (incisura/antrum jar 1, gastric body jar 2). Estimated blood       loss was minimal. Imaging was performed using white light and narrow       band imaging to visualize the mucosa.      The exam of the stomach was otherwise normal.      The Z-line was regular. Estimated blood loss: none.      Esophagogastric landmarks were identified: the gastroesophageal junction       was found at 39 cm from the incisors.      The mid esophagus was tortuous. Estimated blood loss: none.      The exam of the esophagus was otherwise normal. Impression:            - Normal duodenal bulb, first portion of the duodenum                         and second portion of the duodenum.                        - Erythematous mucosa in the antrum. Biopsied.                        - Z-line regular.                        - Esophagogastric landmarks identified.                        - Tortuous esophagus. Recommendation:        - Patient has a contact number available for                         emergencies. The signs and symptoms of potential                         delayed complications were discussed with the patient.                         Return to normal activities tomorrow. Written                         discharge instructions were provided to  the patient.                        - Discharge patient to home.                        - Resume previous diet.                        - Continue present medications.                        - Await pathology results.                        - Return to GI clinic PRN.                        - proceed with colonoscopy. see report for further                         recommendations.                        - The findings and recommendations were discussed with                         the patient. Procedure Code(s):     --- Professional ---                        [phone], Esophagogastroduodenoscopy, flexible,                         transoral; with biopsy, single or  multiple Diagnosis Code(s):     --- Professional ---                        K31.89, Other diseases of stomach and duodenum                        Q39.9, Congenital malformation of esophagus,                         unspecified                        K31.A0, Gastric intestinal metaplasia, unspecified CPT copyright 2022 American Medical Association. All rights reserved. The codes documented in this report are preliminary and upon coder review may  be revised to meet current compliance requirements. Attending Participation:      I personally performed the entire procedure. Elfredia Nevins, DO Jaynie Collins DO, DO 12/14/2023 10:05:06 AM This report has been signed electronically. Number of Addenda: 0 Note Initiated On: 12/14/2023 9:48 AM Estimated Blood Loss:  Estimated blood loss was minimal.      [facility]

## 2023-12-14 NOTE — Transfer of Care (Signed)
Immediate Anesthesia Transfer of Care Note  Patient: Chase Scott  Procedure(s) Performed: COLONOSCOPY WITH PROPOFOL ESOPHAGOGASTRODUODENOSCOPY (EGD) WITH PROPOFOL  Patient Location: Endo  Anesthesia Type:General  Level of Consciousness: drowsy  Airway & Oxygen Therapy: Patient Spontanous Breathing  Post-op Assessment: Report given to RN and Post -op Vital signs reviewed and stable  Post vital signs: stable  Last Vitals:  Vitals Value Taken Time  BP    Temp    Pulse    Resp    SpO2      Last Pain:  Vitals:   12/14/23 0920  TempSrc: Temporal  PainSc: 0-No pain         Complications: No notable events documented.

## 2023-12-17 ENCOUNTER — Encounter: Payer: Self-pay | Admitting: Gastroenterology

## 2023-12-18 LAB — SURGICAL PATHOLOGY

## 2023-12-18 MED ORDER — REPATHA SURECLICK 140 MG/ML ~~LOC~~ SOAJ: 140.0000 mg | SUBCUTANEOUS | 11 refills | Status: DC

## 2023-12-21 NOTE — Telephone Encounter (Signed)
Refill for Repatha has been sent in for the patient. He should call if he has any problems picking this up.

## 2024-02-29 ENCOUNTER — Telehealth: Payer: Self-pay | Admitting: Cardiovascular Disease

## 2024-02-29 NOTE — Telephone Encounter (Signed)
 Patient came by the office and dropped off letter from [employer] regarding his prescription of Repatha, stating that this medication is not covered. Please advise. Thank you. Letter placed in Arida's nurse box.

## 2024-03-05 ENCOUNTER — Telehealth: Payer: Self-pay | Admitting: Pharmacy Technician

## 2024-03-05 ENCOUNTER — Other Ambulatory Visit (HOSPITAL_COMMUNITY): Payer: Self-pay

## 2024-03-05 NOTE — Telephone Encounter (Signed)
 Pharmacy Patient Advocate Encounter  Received notification from [facility] that Prior Authorization for repatha has been APPROVED from 03/05/24 to 03/05/25. Unable to obtain price due to refill too soon rejection, last fill date 02/11/24 next available fill date4/21/25   PA #/Case ID/Reference #: [id number]

## 2024-03-05 NOTE — Telephone Encounter (Signed)
 Repatha approved 03/05/24-03/05/25

## 2024-03-05 NOTE — Telephone Encounter (Signed)
 Letter received from the patient that his Repatha is no longer covered and he will need a replacement.

## 2024-03-05 NOTE — Telephone Encounter (Signed)
 Pharmacy Patient Advocate Encounter   Received notification from Pt Calls Messages that prior authorization for REpatha is required/requested.   Insurance verification completed.   The patient is insured through [facility] .   Per test claim: PA required; PA submitted to above mentioned insurance via CoverMyMeds Key/confirmation #/EOC [id number] Status is pending

## 2024-03-10 NOTE — Telephone Encounter (Signed)
 Patient has been made aware.   Pharmacy Patient Advocate Encounter   Received notification from [facility] that Prior Authorization for repatha  has been APPROVED from 03/05/24 to 03/05/25. Unable to obtain price due to refill too soon rejection, last fill date 02/11/24 next available fill date 03/10/24    PA #/Case ID/Reference #: [id number]

## 2024-03-18 ENCOUNTER — Telehealth: Payer: Self-pay | Admitting: Cardiovascular Disease

## 2024-03-18 NOTE — Telephone Encounter (Signed)
 Called pt and then Nellie Banas (from [facility]) for pt being on Repatha   due to statin intolerance.  Dr Alvenia Aus, please put this billing code on your note at this pt's next visit with you, so he can get insurance to pay for Repatha :  "G72.0 drug induced myopathy for statins"  Thank you, Dovie Gell

## 2024-03-18 NOTE — Telephone Encounter (Signed)
 Pt has been flagged my Medicare and they want to know if he has any statin intolerance

## 2024-04-03 ENCOUNTER — Other Ambulatory Visit: Payer: Self-pay | Admitting: Cardiovascular Disease

## 2024-04-03 NOTE — Progress Notes (Unsigned)
 Cardiology Office Note   Date:  04/04/2024   ID:  Chase Scott, [name] [birth year], MRN [id number]  PCP:  Chase Riff, MD  Cardiologist:   Antionette Kirks, MD   No chief complaint on file.     History of Present Illness: Chase Scott is a 68 y.o. male who presents for a follow-up visit regarding coronary artery disease.    He has known history of coronary artery disease status post CABG in 2004 with subsequent PCI of left circumflex and right coronary artery.  He also has chronic diastolic heart failure, hypertension and hyperlipidemia. He was hospitalized in January, 2019 with unstable angina. Echocardiogram showed an EF of 50% with grade 1 diastolic dysfunction. Cardiac catheterization showed significant underlying three-vessel coronary artery disease with patent stents in the left circumflex and right coronary arteries.  There was patent LIMA to LAD and SVG to diagonal.  SVG to left circumflex was chronically occluded with atretic RIMA to RCA.  There was moderate ostial RCA stenosis with catheter-induced spasm that improved with nitroglycerin .  Left ventricular end-diastolic pressure was normal. He was treated medically.    He is known to have PVCs.  Holter monitor in April, 2019 showed 28,000 PVC in 48 hours representing 18% burden.  Average heart rate was 71 bpm.      He has known history of hyperlipidemia with intolerance to statins.  He is tolerating Repatha  with significant improvement in lipid profile.   He has prolonged history of chest pain that has recently improved after he started using CPAP.   He has been doing well with no chest pain or shortness of breath.  He has minimal palpitations.   Past Medical History:  Diagnosis Date   Alcoholism (HCC)    Anginal pain (HCC)    CAD (coronary artery disease)    a. 01/2003 s/p PCI to RCA;  b. 02/2003 s/ CABG x 4 (LIMA->LAD, RIMA->RCA, VG->Diag, VG->LCX; c. 2005 s/p PCI to LCX; d. 06/2012 Cath: LM nl, LAD 142m, LCX mild  plaque, OM1 min irregs, OM2 patent stent, RCA 24m ISR, VG->LCX 100, VG->Diag ok, RIMA->RCA 100, LIMA->LAD ok, EF 50-55%-->Med Rx; d. 11/2017 Cath: stable anatomy, 2/4 patent grafts. LCX 30 ISR, RCA 50ost, 20 ISR-->Med Rx.   Chronic Chest pain    Diastolic dysfunction    a. 11/2017 Echo: EF 50-55%, no rwma, Gr1 DD, mildly dil LA; b. 02/2019 Echo: EF 50-55%, pseudonormalization. Mildly dil RA.   Dyslipidemia    Gastroesophageal reflux disease    Gastrointestinal symptoms    Chronic gastrointestinal symptoms   History of kidney stones    History of left shoulder fracture    Hyperlipidemia    Hypertensive cardiovascular disease    a. Labile BPs.   OSA (obstructive sleep apnea)    PVC's (premature ventricular contractions)    a. 2019 Holter: 18% PVC burden.   Rotator cuff syndrome     Past Surgical History:  Procedure Laterality Date   Arthroscopic Knee Surgery Right 2009   Arthroscopic Knee Surgery Left 10/2017   BACK SURGERY     Lower   BIOPSY  12/14/2023   Procedure: BIOPSY;  Surgeon: Quintin Buckle, DO;  Location: [facility] ENDOSCOPY;  Service: Gastroenterology;;   CARDIAC CATHETERIZATION     COLONOSCOPY WITH PROPOFOL  N/A 04/29/2018   Procedure: COLONOSCOPY WITH PROPOFOL ;  Surgeon: Cassie Click, MD;  Location: [facility] ENDOSCOPY;  Service: Endoscopy;  Laterality: N/A;   COLONOSCOPY WITH PROPOFOL  N/A 12/14/2023   Procedure: COLONOSCOPY  WITH PROPOFOL ;  Surgeon: Quintin Buckle, DO;  Location: [facility] ENDOSCOPY;  Service: Gastroenterology;  Laterality: N/A;   CORONARY ARTERY BYPASS GRAFT  April 2004   x four   ESOPHAGOGASTRODUODENOSCOPY (EGD) WITH PROPOFOL  N/A 04/29/2018   Procedure: ESOPHAGOGASTRODUODENOSCOPY (EGD) WITH PROPOFOL ;  Surgeon: Cassie Click, MD;  Location: [facility] ENDOSCOPY;  Service: Endoscopy;  Laterality: N/A;   ESOPHAGOGASTRODUODENOSCOPY (EGD) WITH PROPOFOL  N/A 12/14/2023   Procedure: ESOPHAGOGASTRODUODENOSCOPY (EGD) WITH PROPOFOL ;  Surgeon: Quintin Buckle,  DO;  Location: [facility] ENDOSCOPY;  Service: Gastroenterology;  Laterality: N/A;   LEFT HEART CATH AND CORONARY ANGIOGRAPHY N/A 12/14/2017   Procedure: LEFT HEART CATH AND CORONARY ANGIOGRAPHY;  Surgeon: Wenona Hamilton, MD;  Location: [facility] INVASIVE CV LAB;  Service: Cardiovascular;  Laterality: N/A;   LEFT HEART CATHETERIZATION WITH CORONARY/GRAFT ANGIOGRAM  07/02/2012   Procedure: LEFT HEART CATHETERIZATION WITH Estella Helling;  Surgeon: Odie Benne, MD;  Location: [facility] CATH LAB;  Service: Cardiovascular;;   POLYPECTOMY  12/14/2023   Procedure: POLYPECTOMY;  Surgeon: Quintin Buckle, DO;  Location: [facility] ENDOSCOPY;  Service: Gastroenterology;;   TONSILLECTOMY       Current Outpatient Medications  Medication Sig Dispense Refill   aspirin  EC 81 MG EC tablet Take 1 tablet (81 mg total) by mouth daily. 180 tablet 0   clopidogrel  (PLAVIX ) 75 MG tablet Take 1 tablet by mouth daily.     diclofenac (VOLTAREN) 75 MG EC tablet Take 75 mg by mouth 2 (two) times daily.     esomeprazole (NEXIUM) 20 MG capsule Take 40 mg by mouth daily as needed (acid reflux).     ibuprofen (ADVIL) 200 MG tablet Take 400 mg by mouth daily.     isosorbide  mononitrate (IMDUR ) 30 MG 24 hr tablet TAKE 1 TABLET BY MOUTH DAILY 90 tablet 3   losartan  (COZAAR ) 25 MG tablet TAKE 1 TABLET BY MOUTH DAILY 90 tablet 2   metoprolol  succinate (TOPROL -XL) 100 MG 24 hr tablet Take 50-100 mg by mouth See admin instructions. Takes 100 mg in the morning and 50 mg at night     nitroGLYCERIN  (NITROSTAT ) 0.4 MG SL tablet Place 1 tablet (0.4 mg total) under the tongue every 5 (five) minutes as needed for chest pain. 30 tablet 0   ranolazine  (RANEXA ) 500 MG 12 hr tablet Take 1 tablet (500 mg total) by mouth 2 (two) times daily. Please keep scheduled appointment for future refills. Thank you. 60 tablet 0   REPATHA  SURECLICK 140 MG/ML SOAJ Inject 140 mg as directed every 14 (fourteen) days. 2 mL 11   No current  facility-administered medications for this visit.    Allergies:   Penicillins, Sulfa antibiotics, Etodolac, Lipitor  [atorvastatin  calcium ], Paxil [paroxetine hcl], Statins, and Sulfasalazine    Social History:  The patient  reports that he quit smoking about 24 years ago. His smoking use included cigarettes. He started smoking about 39 years ago. He has a 15 pack-year smoking history. He has never used smokeless tobacco. He reports that he does not drink alcohol and does not use drugs.   Family History:  The patient's family history includes Breast cancer in his mother; Heart attack in his sister; Microcephaly in his sister; Other in his father and mother.    ROS:  Please see the history of present illness.   Otherwise, review of systems are positive for none.   All other systems are reviewed and negative.    PHYSICAL EXAM: VS:  BP 132/76   Pulse 65   Ht  5\' 9"  (1.753 m)   Wt 254 lb 12.8 oz (115.6 kg)   SpO2 96%   BMI 37.63 kg/m  , BMI Body mass index is 37.63 kg/m. GEN: Well nourished, well developed, in no acute distress  HEENT: normal  Neck: no JVD, carotid bruits, or masses Cardiac: RRR; no murmurs, rubs, or gallops,no edema  Respiratory:  clear to auscultation bilaterally, normal work of breathing GI: soft, nontender, nondistended, + BS MS: no deformity or atrophy  Skin: warm and dry, no rash Neuro:  Strength and sensation are intact Psych: euthymic mood, full affect Vascular: Distal pulses are normal.  EKG:  EKG is  ordered today. EKG showed: Normal sinus rhythm Normal ECG    Recent Labs: No results found for requested labs within last 365 days.    Lipid Panel    Component Value Date/Time   CHOL 80 10/10/2018 0920   CHOL 180 03/15/2018 1424   TRIG 43 10/10/2018 0920   HDL 58 10/10/2018 0920   HDL 53 03/15/2018 1424   CHOLHDL 1.4 10/10/2018 0920   VLDL 9 10/10/2018 0920   LDLCALC 13 10/10/2018 0920   LDLCALC 107 (H) 03/15/2018 1424      Wt Readings  from Last 3 Encounters:  04/04/24 254 lb 12.8 oz (115.6 kg)  12/14/23 257 lb 5.1 oz (116.7 kg)  04/05/23 250 lb 2 oz (113.5 kg)         12/25/2017    3:08 PM  PAD Screen  Previous PAD dx? No  Previous surgical procedure? No  Pain with walking? No  Feet/toe relief with dangling? No  Painful, non-healing ulcers? No  Extremities discolored? No      ASSESSMENT AND PLAN:   1.  Coronary artery disease involving bypass graft with other forms of angina: He is currently doing very well.  I recommend continuing medical therapy.  Continue antianginal medications including Imdur  and Ranexa .  2.  Essential hypertension: Blood pressure is controlled on current medications.   3.  Hyperlipidemia: Continue treatment with Repatha .  He is intolerant to statins due to myopathy.  Most recent lipid profile showed an LDL of 54 and triglyceride of 135.  4.  PVCs: He reports minimal palpitations at the present time with metoprolol  and Ranexa .  No evidence of premature beats by physical exam or EKG.   5.  Statin induced myopathy: Doing well with Repatha  with excellent response.    Disposition:   FU with me in 12 months  Signed,  Antionette Kirks, MD  04/04/2024 9:51 AM    [facility]

## 2024-04-04 ENCOUNTER — Ambulatory Visit: Payer: Medicare Other | Attending: Cardiovascular Disease | Admitting: Cardiovascular Disease

## 2024-04-04 VITALS — BP 132/76 | HR 65 | Ht 69.0 in | Wt 254.8 lb

## 2024-04-04 DIAGNOSIS — I493 Ventricular premature depolarization: Secondary | ICD-10-CM

## 2024-04-04 DIAGNOSIS — G720 Drug-induced myopathy: Secondary | ICD-10-CM

## 2024-04-04 DIAGNOSIS — E785 Hyperlipidemia, unspecified: Secondary | ICD-10-CM

## 2024-04-04 DIAGNOSIS — I10 Essential (primary) hypertension: Secondary | ICD-10-CM | POA: Diagnosis not present

## 2024-04-04 DIAGNOSIS — I25118 Atherosclerotic heart disease of native coronary artery with other forms of angina pectoris: Secondary | ICD-10-CM

## 2024-04-04 DIAGNOSIS — T466X5A Adverse effect of antihyperlipidemic and antiarteriosclerotic drugs, initial encounter: Secondary | ICD-10-CM

## 2024-04-04 NOTE — Patient Instructions (Signed)

## 2024-04-30 ENCOUNTER — Other Ambulatory Visit: Payer: Self-pay | Admitting: Cardiovascular Disease

## 2024-05-09 ENCOUNTER — Other Ambulatory Visit: Payer: Self-pay | Admitting: Cardiology

## 2024-11-21 ENCOUNTER — Other Ambulatory Visit: Payer: Self-pay | Admitting: Medical
